# Patient Record
Sex: Female | Born: 1935 | Race: White | Hispanic: No | Marital: Married | Smoking: Current some day smoker
Health system: Southern US, Community
[De-identification: ages and names within clinical notes are randomized; demographics above are authoritative.]

## PROBLEM LIST (undated history)

## (undated) DIAGNOSIS — L988 Other specified disorders of the skin and subcutaneous tissue: Secondary | ICD-10-CM

## (undated) DIAGNOSIS — F411 Generalized anxiety disorder: Secondary | ICD-10-CM

## (undated) DIAGNOSIS — I639 Cerebral infarction, unspecified: Secondary | ICD-10-CM

## (undated) DIAGNOSIS — L89309 Pressure ulcer of unspecified buttock, unspecified stage: Secondary | ICD-10-CM

## (undated) DIAGNOSIS — I10 Essential (primary) hypertension: Secondary | ICD-10-CM

## (undated) DIAGNOSIS — R1312 Dysphagia, oropharyngeal phase: Secondary | ICD-10-CM

## (undated) DIAGNOSIS — F329 Major depressive disorder, single episode, unspecified: Secondary | ICD-10-CM

## (undated) DIAGNOSIS — E785 Hyperlipidemia, unspecified: Secondary | ICD-10-CM

## (undated) DIAGNOSIS — F39 Unspecified mood [affective] disorder: Secondary | ICD-10-CM

## (undated) DIAGNOSIS — R41841 Cognitive communication deficit: Secondary | ICD-10-CM

## (undated) DIAGNOSIS — Z5189 Encounter for other specified aftercare: Secondary | ICD-10-CM

## (undated) DIAGNOSIS — K219 Gastro-esophageal reflux disease without esophagitis: Secondary | ICD-10-CM

## (undated) DIAGNOSIS — I6990 Unspecified sequelae of unspecified cerebrovascular disease: Secondary | ICD-10-CM

## (undated) DIAGNOSIS — G2581 Restless legs syndrome: Secondary | ICD-10-CM

## (undated) DIAGNOSIS — J449 Chronic obstructive pulmonary disease, unspecified: Secondary | ICD-10-CM

## (undated) DIAGNOSIS — K5900 Constipation, unspecified: Secondary | ICD-10-CM

## (undated) DIAGNOSIS — G8929 Other chronic pain: Secondary | ICD-10-CM

## (undated) DIAGNOSIS — G4700 Insomnia, unspecified: Secondary | ICD-10-CM

## (undated) DIAGNOSIS — M6281 Muscle weakness (generalized): Secondary | ICD-10-CM

## (undated) DIAGNOSIS — F3289 Other specified depressive episodes: Secondary | ICD-10-CM

## (undated) DIAGNOSIS — R238 Other skin changes: Secondary | ICD-10-CM

## (undated) DIAGNOSIS — R262 Difficulty in walking, not elsewhere classified: Secondary | ICD-10-CM

## (undated) HISTORY — DX: Difficulty in walking, not elsewhere classified: R26.2

## (undated) HISTORY — DX: Insomnia, unspecified: G47.00

## (undated) HISTORY — PX: BONY PELVIS SURGERY: SHX572

## (undated) HISTORY — DX: Major depressive disorder, single episode, unspecified: F32.9

## (undated) HISTORY — DX: Gastro-esophageal reflux disease without esophagitis: K21.9

## (undated) HISTORY — DX: Generalized anxiety disorder: F41.1

## (undated) HISTORY — DX: Muscle weakness (generalized): M62.81

## (undated) HISTORY — DX: Unspecified sequelae of unspecified cerebrovascular disease: I69.90

## (undated) HISTORY — DX: Restless legs syndrome: G25.81

## (undated) HISTORY — DX: Unspecified mood (affective) disorder: F39

## (undated) HISTORY — DX: Pressure ulcer of unspecified buttock, unspecified stage: L89.309

## (undated) HISTORY — DX: Encounter for other specified aftercare: Z51.89

## (undated) HISTORY — DX: Other specified disorders of the skin and subcutaneous tissue: L98.8

## (undated) HISTORY — DX: Essential (primary) hypertension: I10

## (undated) HISTORY — DX: Other specified depressive episodes: F32.89

## (undated) HISTORY — DX: Other chronic pain: G89.29

## (undated) HISTORY — PX: CHOLECYSTECTOMY: SHX55

## (undated) HISTORY — DX: Chronic obstructive pulmonary disease, unspecified: J44.9

## (undated) HISTORY — DX: Hyperlipidemia, unspecified: E78.5

## (undated) HISTORY — DX: Cognitive communication deficit: R41.841

## (undated) HISTORY — DX: Constipation, unspecified: K59.00

## (undated) HISTORY — DX: Dysphagia, oropharyngeal phase: R13.12

## (undated) HISTORY — DX: Other skin changes: R23.8

---

## 2011-03-14 ENCOUNTER — Inpatient Hospital Stay: Payer: Self-pay | Admitting: Internal Medicine

## 2012-09-17 ENCOUNTER — Emergency Department: Payer: Self-pay | Admitting: Emergency Medicine

## 2012-09-17 LAB — URINALYSIS, COMPLETE
Blood: NEGATIVE
Glucose,UR: NEGATIVE mg/dL (ref 0–75)
Hyaline Cast: 1
Nitrite: NEGATIVE
RBC,UR: NONE SEEN /HPF (ref 0–5)

## 2012-09-17 LAB — COMPREHENSIVE METABOLIC PANEL
Albumin: 4.4 g/dL (ref 3.4–5.0)
Alkaline Phosphatase: 157 U/L — ABNORMAL HIGH (ref 50–136)
BUN: 54 mg/dL — ABNORMAL HIGH (ref 7–18)
Calcium, Total: 9.7 mg/dL (ref 8.5–10.1)
Chloride: 107 mmol/L (ref 98–107)
EGFR (African American): 38 — ABNORMAL LOW
Potassium: 4.5 mmol/L (ref 3.5–5.1)
SGOT(AST): 24 U/L (ref 15–37)
SGPT (ALT): 20 U/L (ref 12–78)

## 2012-09-17 LAB — CBC
HCT: 41.7 % (ref 35.0–47.0)
MCH: 29.5 pg (ref 26.0–34.0)
MCHC: 32.5 g/dL (ref 32.0–36.0)
Platelet: 299 10*3/uL (ref 150–440)
RBC: 4.59 10*6/uL (ref 3.80–5.20)
WBC: 12.7 10*3/uL — ABNORMAL HIGH (ref 3.6–11.0)

## 2012-09-17 LAB — DRUG SCREEN, URINE
Barbiturates, Ur Screen: NEGATIVE (ref ?–200)
Benzodiazepine, Ur Scrn: NEGATIVE (ref ?–200)
Cannabinoid 50 Ng, Ur ~~LOC~~: POSITIVE (ref ?–50)
Cocaine Metabolite,Ur ~~LOC~~: NEGATIVE (ref ?–300)
Phencyclidine (PCP) Ur S: NEGATIVE (ref ?–25)
Tricyclic, Ur Screen: NEGATIVE (ref ?–1000)

## 2012-09-17 LAB — SALICYLATE LEVEL: Salicylates, Serum: 3.5 mg/dL — ABNORMAL HIGH

## 2012-09-17 LAB — ETHANOL: Ethanol: <3 mg/dL

## 2012-09-23 ENCOUNTER — Emergency Department: Payer: Self-pay | Admitting: Emergency Medicine

## 2012-09-23 LAB — COMPREHENSIVE METABOLIC PANEL
Alkaline Phosphatase: 137 U/L — ABNORMAL HIGH (ref 50–136)
Anion Gap: 10 (ref 7–16)
BUN: 21 mg/dL — ABNORMAL HIGH (ref 7–18)
Calcium, Total: 9.1 mg/dL (ref 8.5–10.1)
Chloride: 108 mmol/L — ABNORMAL HIGH (ref 98–107)
Co2: 22 mmol/L (ref 21–32)
Creatinine: 1.01 mg/dL (ref 0.60–1.30)
EGFR (Non-African Amer.): 54 — ABNORMAL LOW
Osmolality: 284 (ref 275–301)

## 2012-09-23 LAB — DRUG SCREEN, URINE
Amphetamines, Ur Screen: NEGATIVE (ref ?–1000)
Benzodiazepine, Ur Scrn: POSITIVE (ref ?–200)
MDMA (Ecstasy)Ur Screen: NEGATIVE (ref ?–500)
Opiate, Ur Screen: NEGATIVE (ref ?–300)

## 2012-09-23 LAB — ACETAMINOPHEN LEVEL: Acetaminophen: <2 ug/mL

## 2012-09-23 LAB — URINALYSIS, COMPLETE
Bilirubin,UR: NEGATIVE
Glucose,UR: NEGATIVE mg/dL (ref 0–75)
Hyaline Cast: 7
Nitrite: NEGATIVE
Ph: 5 (ref 4.5–8.0)
Specific Gravity: 1.02 (ref 1.003–1.030)
Squamous Epithelial: 5

## 2012-09-23 LAB — ETHANOL
Ethanol %: <0.003 % (ref 0.000–0.080)
Ethanol: <3 mg/dL

## 2012-09-23 LAB — SALICYLATE LEVEL: Salicylates, Serum: <1.7 mg/dL

## 2012-09-23 LAB — CBC
HCT: 38.7 % (ref 35.0–47.0)
HGB: 12.9 g/dL (ref 12.0–16.0)
MCH: 30.1 pg (ref 26.0–34.0)
MCHC: 33.4 g/dL (ref 32.0–36.0)
Platelet: 252 10*3/uL (ref 150–440)
RBC: 4.29 10*6/uL (ref 3.80–5.20)
WBC: 14.9 10*3/uL — ABNORMAL HIGH (ref 3.6–11.0)

## 2012-09-23 LAB — TSH: Thyroid Stimulating Horm: 1.62 u[IU]/mL

## 2012-09-25 LAB — CLOSTRIDIUM DIFFICILE BY PCR

## 2012-10-19 ENCOUNTER — Emergency Department: Payer: Self-pay | Admitting: Emergency Medicine

## 2012-10-19 LAB — COMPREHENSIVE METABOLIC PANEL
Albumin: 3.5 g/dL (ref 3.4–5.0)
Anion Gap: 6 — ABNORMAL LOW (ref 7–16)
BUN: 23 mg/dL — ABNORMAL HIGH (ref 7–18)
Bilirubin,Total: 0.3 mg/dL (ref 0.2–1.0)
Calcium, Total: 9 mg/dL (ref 8.5–10.1)
Chloride: 107 mmol/L (ref 98–107)
Co2: 25 mmol/L (ref 21–32)
Creatinine: 0.99 mg/dL (ref 0.60–1.30)
EGFR (African American): >60
EGFR (Non-African Amer.): 56 — ABNORMAL LOW
Potassium: 4.9 mmol/L (ref 3.5–5.1)
SGPT (ALT): 21 U/L (ref 12–78)
Sodium: 138 mmol/L (ref 136–145)
Total Protein: 6.9 g/dL (ref 6.4–8.2)

## 2012-10-19 LAB — URINALYSIS, COMPLETE
Bilirubin,UR: NEGATIVE
Nitrite: NEGATIVE
Ph: 5 (ref 4.5–8.0)
Protein: NEGATIVE
RBC,UR: <1 /HPF (ref 0–5)
Specific Gravity: 1.019 (ref 1.003–1.030)

## 2012-10-19 LAB — DRUG SCREEN, URINE
Amphetamines, Ur Screen: NEGATIVE (ref ?–1000)
Barbiturates, Ur Screen: POSITIVE (ref ?–200)
Benzodiazepine, Ur Scrn: POSITIVE (ref ?–200)
Cannabinoid 50 Ng, Ur ~~LOC~~: POSITIVE (ref ?–50)
Cocaine Metabolite,Ur ~~LOC~~: NEGATIVE (ref ?–300)
Methadone, Ur Screen: NEGATIVE (ref ?–300)
Phencyclidine (PCP) Ur S: NEGATIVE (ref ?–25)
Tricyclic, Ur Screen: NEGATIVE (ref ?–1000)

## 2012-10-19 LAB — CBC
HCT: 33.8 % — ABNORMAL LOW (ref 35.0–47.0)
HGB: 11.1 g/dL — ABNORMAL LOW (ref 12.0–16.0)
MCV: 90 fL (ref 80–100)
Platelet: 387 10*3/uL (ref 150–440)
RDW: 14.2 % (ref 11.5–14.5)
WBC: 8.3 10*3/uL (ref 3.6–11.0)

## 2012-10-19 LAB — TROPONIN I: Troponin-I: <0.02 ng/mL

## 2012-10-19 LAB — TSH: Thyroid Stimulating Horm: 2.02 u[IU]/mL

## 2013-06-04 LAB — BASIC METABOLIC PANEL
BUN: 44 mg/dL — ABNORMAL HIGH (ref 7–18)
Calcium, Total: 9.6 mg/dL (ref 8.5–10.1)
Co2: 27 mmol/L (ref 21–32)
Creatinine: 1.49 mg/dL — ABNORMAL HIGH (ref 0.60–1.30)
Glucose: 129 mg/dL — ABNORMAL HIGH (ref 65–99)
Osmolality: 281 (ref 275–301)
Potassium: 4.5 mmol/L (ref 3.5–5.1)
Sodium: 134 mmol/L — ABNORMAL LOW (ref 136–145)

## 2013-06-04 LAB — DRUG SCREEN, URINE
Benzodiazepine, Ur Scrn: NEGATIVE (ref ?–200)
Cannabinoid 50 Ng, Ur ~~LOC~~: POSITIVE (ref ?–50)
Cocaine Metabolite,Ur ~~LOC~~: NEGATIVE (ref ?–300)
MDMA (Ecstasy)Ur Screen: POSITIVE (ref ?–500)
Methadone, Ur Screen: NEGATIVE (ref ?–300)
Phencyclidine (PCP) Ur S: NEGATIVE (ref ?–25)

## 2013-06-04 LAB — URINALYSIS, COMPLETE
Bilirubin,UR: NEGATIVE
Blood: NEGATIVE
Nitrite: NEGATIVE
Ph: 5 (ref 4.5–8.0)
Protein: NEGATIVE
RBC,UR: 2 /HPF (ref 0–5)
Specific Gravity: 1.025 (ref 1.003–1.030)
Squamous Epithelial: 1
WBC UR: 6 /HPF (ref 0–5)

## 2013-06-04 LAB — CBC WITH DIFFERENTIAL/PLATELET
Basophil #: 0 10*3/uL (ref 0.0–0.1)
HGB: 14.3 g/dL (ref 12.0–16.0)
MCH: 31.1 pg (ref 26.0–34.0)
MCV: 92 fL (ref 80–100)
Monocyte %: 9.2 %
Platelet: 226 10*3/uL (ref 150–440)
WBC: 9.8 10*3/uL (ref 3.6–11.0)

## 2013-06-04 LAB — TROPONIN I: Troponin-I: <0.02 ng/mL

## 2013-06-05 ENCOUNTER — Inpatient Hospital Stay: Payer: Self-pay | Admitting: Family Medicine

## 2013-06-05 LAB — BASIC METABOLIC PANEL
Anion Gap: 9 (ref 7–16)
Calcium, Total: 8.9 mg/dL (ref 8.5–10.1)
Chloride: 105 mmol/L (ref 98–107)
Creatinine: 1 mg/dL (ref 0.60–1.30)
EGFR (African American): >60
EGFR (Non-African Amer.): 55 — ABNORMAL LOW
Osmolality: 281 (ref 275–301)
Potassium: 3.6 mmol/L (ref 3.5–5.1)
Sodium: 138 mmol/L (ref 136–145)

## 2013-06-05 LAB — CBC WITH DIFFERENTIAL/PLATELET
Basophil #: 0 10*3/uL (ref 0.0–0.1)
Eosinophil %: 2.7 %
HCT: 34.6 % — ABNORMAL LOW (ref 35.0–47.0)
HGB: 11.5 g/dL — ABNORMAL LOW (ref 12.0–16.0)
Lymphocyte %: 29.8 %
MCHC: 33.1 g/dL (ref 32.0–36.0)
Monocyte #: 0.8 x10 3/mm (ref 0.2–0.9)
Neutrophil #: 3.3 10*3/uL (ref 1.4–6.5)
Neutrophil %: 53.9 %
Platelet: 164 10*3/uL (ref 150–440)
RDW: 12.5 % (ref 11.5–14.5)

## 2013-06-05 LAB — RAPID INFLUENZA A&B ANTIGENS

## 2013-06-05 LAB — LIPID PANEL
HDL Cholesterol: 36 mg/dL — ABNORMAL LOW (ref 40–60)
Ldl Cholesterol, Calc: 60 mg/dL (ref 0–100)
VLDL Cholesterol, Calc: 50 mg/dL — ABNORMAL HIGH (ref 5–40)

## 2013-06-05 LAB — TSH: Thyroid Stimulating Horm: 2.01 u[IU]/mL

## 2013-07-30 ENCOUNTER — Inpatient Hospital Stay: Payer: Self-pay | Admitting: Internal Medicine

## 2013-07-30 LAB — CBC
HCT: 36.6 % (ref 35.0–47.0)
HGB: 12.2 g/dL (ref 12.0–16.0)
MCH: 30.2 pg (ref 26.0–34.0)
MCHC: 33.3 g/dL (ref 32.0–36.0)
MCV: 91 fL (ref 80–100)
PLATELETS: 242 10*3/uL (ref 150–440)
RBC: 4.03 10*6/uL (ref 3.80–5.20)
RDW: 13.6 % (ref 11.5–14.5)
WBC: 9 10*3/uL (ref 3.6–11.0)

## 2013-07-30 LAB — URINALYSIS, COMPLETE
GLUCOSE, UR: NEGATIVE mg/dL (ref 0–75)
Nitrite: POSITIVE
PH: 5 (ref 4.5–8.0)
PROTEIN: NEGATIVE
RBC, UR: NONE SEEN /HPF (ref 0–5)
SPECIFIC GRAVITY: 1.015 (ref 1.003–1.030)

## 2013-07-30 LAB — COMPREHENSIVE METABOLIC PANEL
ALBUMIN: 3.2 g/dL — AB (ref 3.4–5.0)
Alkaline Phosphatase: 111 U/L
Anion Gap: 5 — ABNORMAL LOW (ref 7–16)
BUN: 21 mg/dL — ABNORMAL HIGH (ref 7–18)
Bilirubin,Total: 0.3 mg/dL (ref 0.2–1.0)
CO2: 28 mmol/L (ref 21–32)
Calcium, Total: 9.2 mg/dL (ref 8.5–10.1)
Chloride: 95 mmol/L — ABNORMAL LOW (ref 98–107)
Creatinine: 0.79 mg/dL (ref 0.60–1.30)
EGFR (African American): >60
Glucose: 110 mg/dL — ABNORMAL HIGH (ref 65–99)
Osmolality: 261 (ref 275–301)
Potassium: 4.1 mmol/L (ref 3.5–5.1)
SGOT(AST): 22 U/L (ref 15–37)
SGPT (ALT): 11 U/L — ABNORMAL LOW (ref 12–78)
Sodium: 128 mmol/L — ABNORMAL LOW (ref 136–145)
Total Protein: 6.5 g/dL (ref 6.4–8.2)

## 2013-07-30 LAB — TROPONIN I

## 2013-07-31 LAB — CBC WITH DIFFERENTIAL/PLATELET
BASOS ABS: 0 10*3/uL (ref 0.0–0.1)
Basophil %: 0.2 %
EOS ABS: 0.1 10*3/uL (ref 0.0–0.7)
EOS PCT: 1 %
HCT: 30.8 % — AB (ref 35.0–47.0)
HGB: 10.6 g/dL — AB (ref 12.0–16.0)
LYMPHS ABS: 1.2 10*3/uL (ref 1.0–3.6)
LYMPHS PCT: 15.5 %
MCH: 31.5 pg (ref 26.0–34.0)
MCHC: 34.3 g/dL (ref 32.0–36.0)
MCV: 92 fL (ref 80–100)
MONO ABS: 1 x10 3/mm — AB (ref 0.2–0.9)
Monocyte %: 12.7 %
Neutrophil #: 5.4 10*3/uL (ref 1.4–6.5)
Neutrophil %: 70.6 %
PLATELETS: 192 10*3/uL (ref 150–440)
RBC: 3.36 10*6/uL — AB (ref 3.80–5.20)
RDW: 13.8 % (ref 11.5–14.5)
WBC: 7.7 10*3/uL (ref 3.6–11.0)

## 2013-07-31 LAB — BASIC METABOLIC PANEL
Anion Gap: 6 — ABNORMAL LOW (ref 7–16)
BUN: 16 mg/dL (ref 7–18)
CHLORIDE: 97 mmol/L — AB (ref 98–107)
CO2: 26 mmol/L (ref 21–32)
Calcium, Total: 8.5 mg/dL (ref 8.5–10.1)
Creatinine: 0.92 mg/dL (ref 0.60–1.30)
EGFR (African American): >60
EGFR (Non-African Amer.): >60
GLUCOSE: 90 mg/dL (ref 65–99)
OSMOLALITY: 260 (ref 275–301)
Potassium: 3.8 mmol/L (ref 3.5–5.1)
Sodium: 129 mmol/L — ABNORMAL LOW (ref 136–145)

## 2013-07-31 LAB — LIPID PANEL
CHOLESTEROL: 116 mg/dL (ref 0–200)
HDL Cholesterol: 36 mg/dL — ABNORMAL LOW (ref 40–60)
LDL CHOLESTEROL, CALC: 58 mg/dL (ref 0–100)
Triglycerides: 112 mg/dL (ref 0–200)
VLDL CHOLESTEROL, CALC: 22 mg/dL (ref 5–40)

## 2013-08-04 LAB — BASIC METABOLIC PANEL
Anion Gap: 5 — ABNORMAL LOW (ref 7–16)
BUN: 13 mg/dL (ref 7–18)
CHLORIDE: 100 mmol/L (ref 98–107)
CO2: 28 mmol/L (ref 21–32)
Calcium, Total: 8.6 mg/dL (ref 8.5–10.1)
Creatinine: 0.81 mg/dL (ref 0.60–1.30)
Glucose: 81 mg/dL (ref 65–99)
Osmolality: 266 (ref 275–301)
Potassium: 3.5 mmol/L (ref 3.5–5.1)
Sodium: 133 mmol/L — ABNORMAL LOW (ref 136–145)

## 2013-08-04 LAB — CBC WITH DIFFERENTIAL/PLATELET
BASOS ABS: 0.1 10*3/uL (ref 0.0–0.1)
BASOS PCT: 0.8 %
Eosinophil #: 0.2 10*3/uL (ref 0.0–0.7)
Eosinophil %: 2.8 %
HCT: 30.2 % — AB (ref 35.0–47.0)
HGB: 10.3 g/dL — AB (ref 12.0–16.0)
LYMPHS PCT: 20.5 %
Lymphocyte #: 1.7 10*3/uL (ref 1.0–3.6)
MCH: 31.3 pg (ref 26.0–34.0)
MCHC: 34 g/dL (ref 32.0–36.0)
MCV: 92 fL (ref 80–100)
MONO ABS: 1.3 x10 3/mm — AB (ref 0.2–0.9)
MONOS PCT: 15.9 %
Neutrophil #: 4.9 10*3/uL (ref 1.4–6.5)
Neutrophil %: 60 %
PLATELETS: 187 10*3/uL (ref 150–440)
RBC: 3.28 10*6/uL — ABNORMAL LOW (ref 3.80–5.20)
RDW: 13.9 % (ref 11.5–14.5)
WBC: 8.2 10*3/uL (ref 3.6–11.0)

## 2013-08-04 LAB — CULTURE, BLOOD (SINGLE)

## 2013-08-10 ENCOUNTER — Other Ambulatory Visit: Payer: Self-pay | Admitting: Family Medicine

## 2013-08-10 LAB — CBC WITH DIFFERENTIAL/PLATELET
Basophil #: 0.1 10*3/uL (ref 0.0–0.1)
Basophil %: 0.6 %
EOS ABS: 0.7 10*3/uL (ref 0.0–0.7)
Eosinophil %: 7.4 %
HCT: 32 % — AB (ref 35.0–47.0)
HGB: 10.7 g/dL — ABNORMAL LOW (ref 12.0–16.0)
Lymphocyte #: 2.4 10*3/uL (ref 1.0–3.6)
Lymphocyte %: 24.5 %
MCH: 31.1 pg (ref 26.0–34.0)
MCHC: 33.6 g/dL (ref 32.0–36.0)
MCV: 93 fL (ref 80–100)
Monocyte #: 1.4 x10 3/mm — ABNORMAL HIGH (ref 0.2–0.9)
Monocyte %: 14 %
Neutrophil #: 5.3 10*3/uL (ref 1.4–6.5)
Neutrophil %: 53.5 %
PLATELETS: 306 10*3/uL (ref 150–440)
RBC: 3.46 10*6/uL — AB (ref 3.80–5.20)
RDW: 14.1 % (ref 11.5–14.5)
WBC: 9.9 10*3/uL (ref 3.6–11.0)

## 2013-08-10 LAB — COMPREHENSIVE METABOLIC PANEL
ALBUMIN: 2.7 g/dL — AB (ref 3.4–5.0)
ANION GAP: 10 (ref 7–16)
AST: 20 U/L (ref 15–37)
Alkaline Phosphatase: 132 U/L — ABNORMAL HIGH
BUN: 11 mg/dL (ref 7–18)
Bilirubin,Total: 0.2 mg/dL (ref 0.2–1.0)
CO2: 26 mmol/L (ref 21–32)
CREATININE: 0.66 mg/dL (ref 0.60–1.30)
Calcium, Total: 8.4 mg/dL — ABNORMAL LOW (ref 8.5–10.1)
Chloride: 97 mmol/L — ABNORMAL LOW (ref 98–107)
EGFR (African American): >60
EGFR (Non-African Amer.): >60
Glucose: 92 mg/dL (ref 65–99)
Osmolality: 265 (ref 275–301)
Potassium: 4.3 mmol/L (ref 3.5–5.1)
SGPT (ALT): 10 U/L — ABNORMAL LOW (ref 12–78)
Sodium: 133 mmol/L — ABNORMAL LOW (ref 136–145)
TOTAL PROTEIN: 5.6 g/dL — AB (ref 6.4–8.2)

## 2013-08-10 LAB — URINALYSIS, COMPLETE
Bacteria: NEGATIVE
Bilirubin,UR: NEGATIVE
Blood: NEGATIVE
Glucose,UR: NEGATIVE mg/dL (ref 0–75)
Ketone: NEGATIVE
LEUKOCYTE ESTERASE: NEGATIVE
Nitrite: NEGATIVE
Ph: 7 (ref 4.5–8.0)
Protein: NEGATIVE
RBC, UR: NONE SEEN /HPF (ref 0–5)
SPECIFIC GRAVITY: 1.008 (ref 1.003–1.030)
Squamous Epithelial: NONE SEEN
WBC UR: NONE SEEN /HPF (ref 0–5)

## 2013-08-11 LAB — URINE CULTURE

## 2013-08-20 ENCOUNTER — Encounter: Payer: Self-pay | Admitting: Podiatry

## 2013-08-30 ENCOUNTER — Ambulatory Visit (INDEPENDENT_AMBULATORY_CARE_PROVIDER_SITE_OTHER): Payer: Medicare Other | Admitting: Podiatry

## 2013-08-30 ENCOUNTER — Encounter: Payer: Self-pay | Admitting: Podiatry

## 2013-08-30 VITALS — BP 117/70 | HR 93 | Resp 16 | Ht 68.0 in | Wt 140.0 lb

## 2013-08-30 DIAGNOSIS — B351 Tinea unguium: Secondary | ICD-10-CM

## 2013-08-30 DIAGNOSIS — M722 Plantar fascial fibromatosis: Secondary | ICD-10-CM

## 2013-08-30 DIAGNOSIS — M79609 Pain in unspecified limb: Secondary | ICD-10-CM

## 2013-08-30 NOTE — Progress Notes (Signed)
   Subjective:    Patient ID: [name] Stuart, female    DOB: 02/26/1935, 79 y.o.   MRN: [id number]  HPI Comments: N sore L sore spot on left heel/ trim toenails D 2 - 3 weeks O ? C same A shoes T 0 / doctor at [facility] trims toenails  Foot Pain Associated symptoms include coughing, fatigue and weakness.      Review of Systems  Constitutional: Positive for fatigue.  HENT: Negative.   Eyes: Negative.   Respiratory: Positive for cough.   Gastrointestinal: Negative.   Endocrine: Negative.   Genitourinary: Negative.   Musculoskeletal:       Difficulty walking  Skin: Positive for wound.  Allergic/Immunologic: Negative.   Neurological: Positive for weakness.  Hematological: Negative.   Psychiatric/Behavioral: Negative.        Objective:   Physical Exam        Assessment & Plan:

## 2013-09-01 NOTE — Progress Notes (Signed)
Subjective:     Patient ID: [name] Stuart, female   DOB: 1935/04/22, 79 y.o.   MRN: [id number]  Foot Pain   patient presents with nail disease 1-5 of both feet that are thick and painful when pressed with patient having inability to cut herself   Review of Systems  All other systems reviewed and are negative.       Objective:   Physical Exam  Nursing note and vitals reviewed. Constitutional: She is oriented to person, place, and time.  Cardiovascular: Intact distal pulses.   Musculoskeletal: Normal range of motion.  Neurological: She is oriented to person, place, and time.  Skin: Skin is warm and dry.   neurovascular status intact with no health history changes noted and range of motion adequate with mild equinus condition of both feet. Patient has normal Fill time with warmth to the digits with dry skin noted. Nailbeds are thick 1-5 both feet and impossible for her to cut     Assessment:     Mycotic nail infection with pain 1-5 both feet    Plan:     H&P performed and debridement of nailbeds 1-5 both feet with no iatrogenic bleeding noted

## 2013-09-11 ENCOUNTER — Other Ambulatory Visit: Payer: Self-pay | Admitting: Family Medicine

## 2013-09-11 LAB — URINALYSIS, COMPLETE
Bacteria: NONE SEEN
Bilirubin,UR: NEGATIVE
Blood: NEGATIVE
Glucose,UR: NEGATIVE mg/dL (ref 0–75)
Ketone: NEGATIVE
Leukocyte Esterase: NEGATIVE
Nitrite: NEGATIVE
PH: 6 (ref 4.5–8.0)
Protein: NEGATIVE
RBC,UR: 1 /HPF (ref 0–5)
SQUAMOUS EPITHELIAL: NONE SEEN
Specific Gravity: 1.012 (ref 1.003–1.030)
WBC UR: 1 /HPF (ref 0–5)

## 2013-09-13 LAB — URINE CULTURE

## 2013-11-22 ENCOUNTER — Ambulatory Visit: Payer: Medicare Other | Admitting: Podiatry

## 2014-07-26 ENCOUNTER — Ambulatory Visit: Payer: Self-pay | Admitting: Family Medicine

## 2014-07-26 LAB — VALPROIC ACID LEVEL: VALPROIC ACID: 84 ug/mL

## 2014-07-26 LAB — CBC WITH DIFFERENTIAL/PLATELET
BASOS ABS: 0 10*3/uL (ref 0.0–0.1)
BASOS PCT: 0.5 %
EOS ABS: 0.1 10*3/uL (ref 0.0–0.7)
Eosinophil %: 0.9 %
HCT: 39.9 % (ref 35.0–47.0)
HGB: 13 g/dL (ref 12.0–16.0)
LYMPHS ABS: 2.1 10*3/uL (ref 1.0–3.6)
Lymphocyte %: 24.9 %
MCH: 30.5 pg (ref 26.0–34.0)
MCHC: 32.6 g/dL (ref 32.0–36.0)
MCV: 94 fL (ref 80–100)
Monocyte #: 1.2 x10 3/mm — ABNORMAL HIGH (ref 0.2–0.9)
Monocyte %: 14.3 %
NEUTROS ABS: 5.1 10*3/uL (ref 1.4–6.5)
Neutrophil %: 59.4 %
Platelet: 280 10*3/uL (ref 150–440)
RBC: 4.26 10*6/uL (ref 3.80–5.20)
RDW: 13.6 % (ref 11.5–14.5)
WBC: 8.6 10*3/uL (ref 3.6–11.0)

## 2014-07-26 LAB — COMPREHENSIVE METABOLIC PANEL
ALBUMIN: 2.4 g/dL — AB (ref 3.4–5.0)
ALT: 9 U/L — AB
ANION GAP: 8 (ref 7–16)
AST: 21 U/L (ref 15–37)
Alkaline Phosphatase: 132 U/L — ABNORMAL HIGH
BILIRUBIN TOTAL: 0.2 mg/dL (ref 0.2–1.0)
BUN: 14 mg/dL (ref 7–18)
Calcium, Total: 8.7 mg/dL (ref 8.5–10.1)
Chloride: 101 mmol/L (ref 98–107)
Co2: 28 mmol/L (ref 21–32)
Creatinine: 0.85 mg/dL (ref 0.60–1.30)
EGFR (African American): >60
EGFR (Non-African Amer.): >60
Glucose: 59 mg/dL — ABNORMAL LOW (ref 65–99)
OSMOLALITY: 272 (ref 275–301)
POTASSIUM: 4.7 mmol/L (ref 3.5–5.1)
Sodium: 137 mmol/L (ref 136–145)
Total Protein: 6.4 g/dL (ref 6.4–8.2)

## 2014-07-26 LAB — LIPID PANEL
Cholesterol: 166 mg/dL (ref 0–200)
HDL Cholesterol: 28 mg/dL — ABNORMAL LOW (ref 40–60)
LDL CHOLESTEROL, CALC: 82 mg/dL (ref 0–100)
Triglycerides: 278 mg/dL — ABNORMAL HIGH (ref 0–200)
VLDL CHOLESTEROL, CALC: 56 mg/dL — AB (ref 5–40)

## 2014-10-24 NOTE — Discharge Summary (Signed)
PATIENT NAME:  [name] Stuart, [name] Stuart MR#:  [id number] DATE OF BIRTH:  1935/06/26  DATE OF ADMISSION:  06/05/2013 DATE OF DISCHARGE:  06/09/2013  This is a very nice 80 year old female with history of dyslipidemia, hypertension, coronary artery disease Comes with generalized weakness, dehydration, acute renal failure and hypotension. This is likely due to intravascular volume depletion on top of possible narcotic and polysubstance abuse. The patient has been very weak, very lethargic. She states at home she does not walk, she does not get around. She is getting more debilitated, for which she is admitted.   As far as problems, the patient has generalized weakness which is due to general deconditioning, dehydration, malnutrition secondary to pure protein intake, renal failure and hypotension.   The patient had blood cultures which show gram-positive cocci in one bottle, which was Streptococcus viridans and this was due to contamination of the sample. Following cultures, urine culture was negative. Chest x-ray did not show any significant signs of pneumonia. The patient has been afebrile. No signs of systemic infection.   As far as her hypotension, please refer the patient had IV fluids and continued to improve. She did developed some edema at after her IV fluids were done, for which Lasix was given on a small dose.   She had acute failure, on admission for what her ACE inhibitor was held and at this moment is not going to be continued since her blood pressure was borderline low. Recommend to re-evaluate this, and if her blood pressure continues to go up to restart that medication in the next week or so. Please follow up on this issue at the nursing home.   She has history of cerebrovascular accident for which she has Plavix. She has restless leg syndrome for which she takes Requip, and overall she is starting to get a little bit better strength. She had a full evaluation by speech therapy for what she had  modification on her diet, mechanical soft diet, and that was triggered due to the patient continues to have cough when she eats.   The patient has history of polysubstance abuse. She has positive marijuana and MDMA on her urine drug screen. She also has nicotine addiction for which she has been counseled. Continue to use inhalers and smoking cessation has been recommended.  The patient is a FULL CODE and she is going to be admitted to skilled nursing facility for continuation of her care.   MEDICATIONS AT DISCHARGE: Pravastatin 40 mg a day, omeprazole 20 mg in the morning, hydrochlorothiazide 25 mg daily, Plavix 75 mg daily, Ropinirole 0.5 mg daily, Spiriva 18 mcg daily, tramadol 50 mg daily, divalproex 250 mg 3 tablets once a day in the evening, trazodone 100 mg in the evening, Lexapro 10 mg once a day, olanzapine 2.5 mg twice daily.   FOLLOWUP: With attending at the nursing home. Please address blood pressure if continues to raise. It is okay to start her back on lisinopril if her kidney function has improved.   Continue physical therapy and smoking cessation counseling.   I spent about 45 minutes with this discharge.   ____________________________ Felipa Furnaceoberto Sanchez Gutierrez, MD rsg:aw D: 06/09/2013 10:54:59 ET T: 06/09/2013 11:31:50 ET JOB#: [id number]  cc: Felipa Furnaceoberto Sanchez Gutierrez, MD, <Dictator> Demetrus Pavao Juanda ChanceSANCHEZ GUTIERRE MD ELECTRONICALLY SIGNED 06/13/2013 18:11

## 2014-10-24 NOTE — H&P (Signed)
PATIENT NAME:  [name] CageCRAWFORD, Arwilda MR#:  [id number] DATE OF BIRTH:  1934/09/11  DATE OF ADMISSION:  06/04/2013  ADDENDUM  Note:  Nicotine cessation counseling done, 4 minutes spent. The patient will be offered a nicotine patch, which she does not want. I recommended her to stop smoking.   ____________________________ Lacie ScottsShreyang H. Allena KatzPatel, MD shp:ce D: 06/04/2013 18:31:46 ET T: 06/04/2013 19:37:27 ET JOB#: [id number]  cc: Chrishawn Boley H. Allena KatzPatel, MD, <Dictator> Charise CarwinSHREYANG H Rogers Ditter MD ELECTRONICALLY SIGNED 06/14/2013 12:48

## 2014-10-24 NOTE — Consult Note (Signed)
Chief Complaint:  Chief Complaint Anger and Irritability   Presenting Symptoms:  Presenting Symptoms Anger/irritability  Anxiety/Panic  Appetite Disturbance  Sleep Disturbance  Mood Disturbance   History of Present Illness:  History of Present Illness Pt is a 80 years old married WF brought to the ED by her family due to worsening behavioral problems. she has been becoming more agitated, throwing things at home and not taking care of herslef. her husband was concerned about her behavior and brought her for evaluation and treatment. During my interview, she was alone and stated that she must be doing something wrong that brought her here. she stated that she has a big black cat leading to all the scrtach marks on her legs. she stated that her daughter comes over to help her. Her UDS is Positive for MJ and according to her husband, she smokes MJ on a consistent basis and also uses Xanax 0.25 TID prescribed by her PCP. she had the stroke 2 years ago leading to difficulty with ambulation. Pt was trying to minimize her symptoms . She denied SI/HI or plans.   Target Symptoms:  Depressive Depressed Mood   Manic Impaired Judgment   Psychosis Disorganization   Behavior Impulsivity   Arousal/Cognitive Decreased Concentration  Behavior Disturbances   Substance Abuse- Cannabis: The patient currently uses cannabis on a regular basis.Marland Kitchen  PAST MEDICAL & SURGICAL HX:  Significant Events:   CVA/Stroke:   CURRENT OUTPATIENT MEDICATIONS:  Home Medications: Medication Instructions Status  Norvasc 10 mg oral tablet 1 tab(s) orally once a day Active  Colace 100 mg oral capsule 1 cap(s) orally 2 times a day Active  MiraLax 17 gram(s) orally once a day, As Needed Active  Plavix 75 mg oral tablet 1 tab(s) orally once a day Active  lisinopril 20 mg oral tablet 1 tab(s) orally 2 times a day Active  Xanax 0.25 mg oral tablet 1 tab(s) orally 3 times a day, As Needed Active  Zantac 150 oral tablet 1 tab(s)  orally 2 times a day Active  Requip 0.25 mg oral tablet 1 tab(s) orally once a day (at bedtime) Active  Lopressor 25 milligram(s) orally 2 times a day Active  Lipitor 40 mg oral tablet 1 tab(s) orally once a day (at bedtime) Active  Tylenol 325 mg oral tablet 2 tab(s) orally every 6 hours, As Needed Active  Benadryl Dye Free Allergy 25 mg oral tablet 1 tab(s) orally once a day (at bedtime), As Needed Active   Social History: Lives with husband. Daughter is very supportive.  Mental Status Exam:  Mental Status Exam Disheveled appearing female.   Speech Monotone   Mood Anxious   Affect Constricted   Thought Processes Disorganized   Orientation Self  Place   Attention Awake  Oriented   Concentration Fair   Memory Intact   Fund of Knowledge Fair   Language Fair   Judgement Fair   Insight Fair   Reliabiity Fair   Suicide Risk Assessment: Suicide Risk Level No risk inidicated.  Review of Systems:  Review of Systems:  Medications/Allergies Reviewed Medications/Allergies reviewed   NURSING FLOWSHEETS:  Vital Signs/Nurse Notes-CM: ED Vital Sign Flow Sheet:   18-Mar-14 13:12  Pulse Pulse 88  Respirations Respirations 20  SBP SBP 114  DBP DBP 60  Pulse Ox % Pulse Ox % 100  Pulse Ox Source Source Room Air  Pain Scale (0-10) Pain Scale (0-10) Scale:7  Telemetry pattern Cardiac Rhythm Normal sinus rhythm  Cardiac Monitor On Yes  LAB:  Laboratory Results: Hepatic:    17-Mar-14 19:04, Comprehensive Metabolic Panel  Bilirubin, Total 0.5  Alkaline Phosphatase 157  SGPT (ALT) 20  SGOT (AST) 24  Total Protein, Serum 8.1  Albumin, Serum 4.4  Lab:    17-Mar-14 19:58, ABG  pH (ABG) 7.36  PCO2 33  PO2 84  FiO2 21  Base Excess -5.9  HCO3 18.6  O2 Saturation 96.7  O2 Device Room Air  Specimen Site (ABG)   RT BRACHIAL  Specimen Type (ABG) ARTERIAL  Patient Temp (ABG) 37.0  Result(s) reported on 17 Sep 2012 at 08:13PM.  General Ref:    17-Mar-14 19:04,  Acetaminophen, Serum  Acetaminophen, Serum < 2  10-30  POTENTIALLY TOXIC:   > 200 mcg/mL   > 50 mcg/mL at 12 hr after   ingestion   > 300 mcg/mL at 4 hr after   ingestion    48-GNO-03 70:48, Salicylates, Serum  Salicylates, Serum 3.5  0.0-2.8  Therapeutic 2.8-20.0 mg/dL  Toxic >30.0 mg/dL  Cardiology:    17-Mar-14 20:39, ED ECG  Ventricular Rate 92  Atrial Rate 92  P-R Interval 142  QRS Duration 112  QT 396  QTc 489  P Axis 76  R Axis 90  T Axis 62  ECG interpretation   Normal sinus rhythm  Right bundle branch block  Abnormal ECG  When compared with ECG of 14-Mar-2011 13:07,  Right bundle branch block has replaced Incomplete right bundle branch block  Criteria for Septal infarct are no longer Present  ----------unconfirmed----------  Confirmed by OVERREAD, NOT ([id number]), editor PEARSON, BARBARA ([id number]) on 09/18/2012 8:26:10 AM  ED ECG   Routine Chem:    17-Mar-14 19:04, Comprehensive Metabolic Panel  Glucose, Serum 107  BUN 54  Creatinine (comp) 1.53  Sodium, Serum 141  Potassium, Serum 4.5  Chloride, Serum 107  CO2, Serum 21  Calcium (Total), Serum 9.7  Osmolality (calc) 296  eGFR (African American) 38  eGFR (Non-African American) 33  eGFR values <89mL/min/1.73 m2 may be an indication of chronic  kidney disease (CKD).  Calculated eGFR is useful in patients with stable renal function.  The eGFR calculation will not be reliable in acutely ill patients  when serum creatinine is changing rapidly. It is not useful in   patients on dialysis. The eGFR calculation may not be applicable  to patients at the low and high extremes of body sizes, pregnant  women, and vegetarians.  Anion Gap 13    17-Mar-14 19:04, Ethanol, Serum  Ethanol, S. < 3  Ethanol % (comp) < 0.003  Result(s) reported on 17 Sep 2012 at 07:59PM.  Urine Drugs:    17-Mar-14 20:29, Urine Drug Screen, Qual  Tricyclic Antidepressant, Ur Qual (comp) NEGATIVE  Result(s) reported on 17 Sep 2012 at 08:48PM.   Amphetamines, Urine Qual. NEGATIVE  MDMA, Urine Qual. NEGATIVE  Cocaine Metabolite, Urine Qual. NEGATIVE  Opiate, Urine qual NEGATIVE  Phencyclidine, Urine Qual. NEGATIVE  Cannabinoid, Urine Qual. POSITIVE  Barbiturates, Urine Qual. NEGATIVE  Benzodiazepine, Urine Qual. NEGATIVE  -----------------  The URINE DRUG SCREEN provides only a preliminary, unconfirmed  analytical test result and should not be used for non-medical   purposes.  Clinical consideration and professional judgment should be   applied to any positive drug screen result due to possible  interfering substances.  A more specific alternate chemical method  must be used in order to obtain a confirmed analytical result.  Gas  chromatography/mass spectrometry (GC/MS) is the preferred  confirmatory method.  Methadone,  Urine Qual. NEGATIVE  Cardiac:    17-Mar-14 19:04, Cardiac Panel  CK, Total 77  CPK-MB, Serum 3.1  Result(s) reported on 17 Sep 2012 at 07:59PM.    17-Mar-14 19:04, Troponin I  Troponin I 0.03  0.00-0.05  0.05 ng/mL or less: NEGATIVE   Repeat testing in 3-6 hrs   if clinically indicated.  >0.05 ng/mL: POTENTIAL   MYOCARDIAL INJURY. Repeat   testing in 3-6 hrs if   clinically indicated.  NOTE: An increase or decrease   of 30% or more on serial   testing suggests a   clinically important change  Routine UA:    17-Mar-14 20:29, Urinalysis  Color (UA) Yellow  Clarity (UA) Hazy  Glucose (UA) Negative  Bilirubin (UA) Negative  Ketones (UA) 1+  Specific Gravity (UA) 1.018  Blood (UA) Negative  pH (UA) 5.0  Protein (UA) Negative  Nitrite (UA) Negative  Leukocyte Esterase (UA) Trace  Result(s) reported on 17 Sep 2012 at 08:52PM.  RBC (UA)   NONE SEEN  WBC (UA) 1 /HPF  Bacteria (UA) TRACE  Epithelial Cells (UA) <1 /HPF  Mucous (UA) PRESENT  Hyaline Cast (UA) 1 /LPF  Result(s) reported on 17 Sep 2012 at 08:52PM.  Routine Hem:    17-Mar-14 19:04, Hemogram, Platelet Count  WBC (CBC) 12.7  RBC  (CBC) 4.59  Hemoglobin (CBC) 13.6  Hematocrit (CBC) 41.7  Platelet Count (CBC) 299  Result(s) reported on 17 Sep 2012 at 07:56PM.  MCV 91  MCH 29.5  MCHC 32.5  RDW 13.9   Assessment & Diagnosis: Axis I: Mood Do NOs Cannabis Abuse.   Axis II: none.   Axis III: CVA.  Treatment Plan: Pt does not meet the criteria for IVC Will adjust meds as follows Xanax 0.98m po qhs add Paxil 149mpoqhs  D/C Benadryl.  Follow up with [facility].  Discussed with family and they agreed with plan.  Electronic Signatures: FaJeronimo NormaMD)  (Signed 18-Mar-14 23:28)  Authored: Chief Complaint, Presenting Symptoms, History of Present Illness, Target Symptoms, Substance Abuse History, PAST MEDICAL & SURGICAL HX, CURRENT OUTPATIENT MEDICATIONS, Social History, Mental Status Exam, Suicide Risk Assessment, Review of Systems, NURSING FLOWSHEETS, LAB, Assessment & Diagnosis, Treatment Plan   Last Updated: 18-Mar-14 23:28 by FaJeronimo NormaMD)

## 2014-10-24 NOTE — H&P (Signed)
PATIENT NAME:  [name] Stuart, [name] Stuart MR#:  [id number] DATE OF BIRTH:  08-Dec-1934  DATE OF ADMISSION:  06/04/2013  PRIMARY CARE PROVIDER: Dr. Robby Sermon according to her.   REFERRING PHYSICIAN:  Caryn Bee A. Paduchowski, MD   CHIEF COMPLAINT: Bilateral lower extremity weakness.   HISTORY OF PRESENT ILLNESS: The patient is a 80 year old white female with history of previous lacunar strokes with history of right-sided weakness, who normally uses a walker to ambulate. She also has a history of cognitive disorder, felt to be due to CVA as well as THC abuse in the past who presents with not feeling well for the past few weeks. The patient reports that she has been having difficulty with walking. She has had difficulty with walking and she is not able to get out of bed. She has been laying in bed unable to do much. The patient, when arrived in the ED, was noted to have intermittent low blood pressure in the 70s. She is on antihypertensives and she has been taking those a regular basis. She denies asymmetrical weakness. She denies any fevers, chills. No chest pains, no palpitations. Does complain of chronic cough with whitish sputum as a result of smoking. Denies any abdominal pain, nausea or vomiting or diarrhea. Denies any urinary frequency, urgency or hesitancy. Denies any neck pain, photophobia or neck stiffness. Denies any visual difficulties.   PAST MEDICAL HISTORY: Significant for:  1.  History of restless leg syndrome.  2.  History of cognitive disorder felt to be due to her CVA as well as THC abuse, has been seen by psychiatry multiple times.  3.  Hypertension.  4.  History of CVA, according to her 2 times.  5.  Hyperlipidemia.   PAST SURGICAL HISTORY: History of lymph node biopsy for possible cat scratch disease, history of cholecystectomy.   ALLERGIES: ASPIRIN, CODEINE AND IBUPROFEN.   CURRENT MEDICATIONS: She is on Plavix 75 p.o. daily, valproic acid 250 three tabs daily, Lexapro 10 daily,  hydrochlorothiazide 25 daily, lisinopril 40 daily, olanzapine 2.5 mg 1 tab p.o. b.i.d., omeprazole 20 one tab p.o. daily, pravastatin 40 daily, ropinirole 0.5 at bedtime, tramadol 50 one tab p.o. every morning, trazodone 100 at bedtime.   SOCIAL HISTORY: Still smoking about 1 to 2 cigarettes a day, history of heavy smoking before but in the process of quitting. No alcohol or drug use. She currently lives with her husband, continues to smoke marijuana.   FAMILY HISTORY: No family history of hypertension or diabetes.   REVIEW OF SYSTEMS: CONSTITUTIONAL: Complains of no fevers. Complains of fatigue, weakness. No pain. No weight loss. No weight gain.  EYES: No blurred or double vision. No pain. No redness. No inflammation. No glaucoma. No cataracts.  ENT: No tinnitus. No ear pain. No hearing loss. No seasonal or year-round allergies. No epistaxis. No snoring. No postnasal drip. No difficulty swallowing.  RESPIRATORY: Has chronic cough. No wheezing. No hemoptysis. No COPD diagnosis.  CARDIOVASCULAR: Denies any chest pain, orthopnea, edema or arrhythmia.  GASTROINTESTINAL: No nausea, vomiting, diarrhea. No abdominal pain. No hematemesis. No melena. No GERD. No IBS. No jaundice.  GENITOURINARY: Denies any dysuria, hematuria, renal calc or frequency.  ENDOCRINE: Denies any polyuria, nocturia or thyroid problems.  HEMATOLOGIC AND LYMPHATIC: Denies anemia, easy bruisability or bleeding.  SKIN: No acne. No rash. No changes in mole, hair or skin.  MUSCULOSKELETAL: Denies any pain in the neck, back or shoulder.  NEUROLOGIC: Has chronic weakness. Denies any dysarthria. No seizures. Has a history of CVA, has some  memory loss.   PSYCHIATRIC: Has anxiety, has cognitive disorder.   PHYSICAL EXAMINATION: VITAL SIGNS: Temperature 97.8, pulse 93, respirations 18, blood pressure 112/66, O2 is 96%.  GENERAL: The patient is a very disheveled female, appears chronically ill.  HEENT: Head atraumatic, normocephalic.  Pupils equally round, reactive to light and accommodation. There is no conjunctival pallor. No scleral icterus. Nasal exam shows no drainage or ulceration. Oropharynx is very dry.  NECK: Supple without any JVD. No carotid bruits.  CARDIOVASCULAR: Regular rate and rhythm. No murmurs, rubs, clicks or gallops. PMI is not displaced.  ABDOMEN: Soft, nontender, nondistended, positive bowel sounds x 4. No hepatosplenomegaly.  EXTREMITIES: No clubbing, cyanosis or edema.  SKIN: No rash.  LYMPHATICS: No lymph nodes palpable.  NEUROLOGIC: Awake, alert, oriented x 3. She has got generalized weakness involving all 4 extremities. Reflexes 2+. Babinski is downgoing.  PSYCHIATRIC: Not anxious or depressed.   LABORATORY AND RADIOLOGICAL EVALUATIONS: So far, glucose 129, BUN 44, creatinine 1.49, sodium 134, potassium 4.5, chloride 100, CO2 27, calcium 9.6. Troponin less than 0.02.  tuds are THC positive, MDMA positive. WBC 9.8, hemoglobin 14.3, platelet count 226, leukocytes 1+. Lactic acid 2.1. Chest x-ray shows no acute abnormality, mild elevation of the left hemidiaphragm.   ASSESSMENT AND PLAN: The patient is a 80 year old white female with history of lacunar infarcts in the past with hypertension who presents with generalized weakness, noted to have low blood pressure, acute renal failure.  1.  Generalized weakness, likely due to hypotension and dehydration. At this time, we will give her IV fluids. She also may have possible mild urinary tract infection. Will treat her with p.o. Cipro. I will also get a CT scan of the head in light of her previous history of stroke. I will also ask physical therapy to come see the patient.  2.  Hypertension. We will hold her antihypertensives for the time being. Follow her blood pressure.  3.  History of cerebrovascular accident. Continue Plavix as taking previously.  4.  History of restless leg syndrome. Continue Requip as taking at home.  5.  Cognitive disorder. We will  continue Depakote, Lexapro and trazodone as taking at home.  6.  Miscellaneous. We will place her on Lovenox for deep vein thrombosis prophylaxis.   TIME SPENT:  55 minutes spent on this H and P.     ____________________________ Lacie ScottsShreyang H. Allena KatzPatel, MD shp:cs D: 06/04/2013 18:30:08 ET T: 06/04/2013 18:50:57 ET JOB#: [id number]  cc: Jackeline Gutknecht H. Allena KatzPatel, MD, <Dictator> Charise CarwinSHREYANG H Nahdia Doucet MD ELECTRONICALLY SIGNED 06/14/2013 12:48

## 2014-10-24 NOTE — Consult Note (Signed)
Brief Consult Note: Diagnosis: Dementia NOS, Cannabis Abuse.   Patient was seen by consultant.   Recommend further assessment or treatment.   Comments: Pt seen in ED. She appeared calm and cooperative and did not exhibit any behavioral problems. [facility] as her family was unable to take care of her home due to her agitation. She was d/c home last week from ED and her meds were adjusted but her husband did not change her meds. They are looking for placement in Psych hospital at this time.  She denied SI/HI or plans.   Plan:  will continue meds.  Awaiting placement.  No change in meds.  Electronic Signatures: Rhunette CroftFaheem, Vanshika Jastrzebski S (MD)  (Signed 25-Mar-14 12:18)  Authored: Brief Consult Note   Last Updated: 25-Mar-14 12:18 by Rhunette CroftFaheem, Emori Mumme S (MD)

## 2014-10-25 NOTE — H&P (Signed)
PATIENT NAME:  [name] Stuart, [name] Stuart MR#:  [id number] DATE OF BIRTH:  Apr 04, 1935  DATE OF ADMISSION:  07/30/2013  PRIMARY CARE PHYSICIAN: Dr. Elpidio AnisPrasad.  CHIEF COMPLAINT: Brought in by her family.   HISTORY OF PRESENT ILLNESS: This is a 80 year old female with history of CVA, hyperlipidemia, hypertension. She is brought in by her family since she has not been feeling well. The patient is unable to give me any pertinent history secondary to altered mental status. In the ER, she was found to have a pneumonia, tachycardic, and hyponatremia. Hospitalist services were contacted for further evaluation. The daughter who was at the bedside left before my evaluation and I was unable to reach her on the phone. History obtained from old chart.   PAST MEDICAL HISTORY: Restless leg syndrome, cognitive disorder, hypertension, CVA, hyperlipidemia.   PAST SURGICAL HISTORY: Lymph node biopsy, cholecystectomy.   ALLERGIES: IN THE COMPUTER LISTED AS ASPIRIN, CODEINE, IBUPROFEN.   CURRENT MEDICATIONS: Include Plavix 75 mg daily, Depakote 250 mg 3 tablets in the evening, Lexapro 10 mg daily, hydrochlorothiazide 25 mg daily, olanzapine 2.5 mg twice a day, omeprazole 20 mg daily, pravastatin 10 mg daily, Requip 0.5 mg in the evening, Spiriva 1 inhalation daily, tramadol 50 mg in the morning, trazodone 100 mg in the evening.   SOCIAL HISTORY: Currently at home. No smoking. No alcohol. No drug use.   FAMILY HISTORY: Unable to obtain from patient secondary to altered mental status.   REVIEW OF SYSTEMS: In this patient was unreliable. She answered yes to every question.  CONSTITUTIONAL: Positive for fever. Positive for chills. Positive for sweats. Positive for weight loss. Positive for weakness.  EYES: Does wear glasses.  EARS, NOSE, MOUTH AND THROAT: Decreased hearing. Positive runny nose. Positive for sore throat. Positive for dysphagia.  CARDIOVASCULAR: Positive for chest pain. Positive for palpitations.  RESPIRATORY:  Positive for shortness of breath. Positive for cough. Positive for sputum, cannot tell what color.  GASTROINTESTINAL: Positive for nausea. Positive for vomiting. Positive for abdominal pain. Positive for diarrhea.  GENITOURINARY: Positive for blood in the urine and burning on urination.  MUSCULOSKELETAL: Positive for joint pains.  INTEGUMENTARY: Positive for rash.  NEUROLOGIC: Positive for fainting.  PSYCHIATRIC: Positive for depression and anxiety.  ENDOCRINE: No thyroid problems.  HEMATOLOGIC AND LYMPHATIC: No anemia.   PHYSICAL EXAMINATION: VITAL SIGNS: Temperature 97.5, pulse 94, respirations 20, blood pressure 165/79, pulse oximetry 94% on room air.  GENERAL: No respiratory distress.  EYES: Conjunctivae and lids normal. Pupils equal, round, and reactive to light. Unable to test extraocular muscles.  EARS, NOSE, MOUTH AND THROAT: Tympanic membranes: No erythema. Nasal mucosa: No erythema. Throat: No erythema, no exudate seen. Lips and gums: No lesions.  NECK: No JVD. No bruits. No lymphadenopathy. No thyromegaly. No thyroid nodules palpated.  RESPIRATORY: Rhonchi bilateral bases.  CARDIOVASCULAR SYSTEM: S1, S2 normal. No gallops, rubs, or murmurs heard. Carotid upstroke 2+ bilaterally. No bruits. Dorsalis pedis pulses 1+ bilaterally. Trace edema of the lower extremities.  ABDOMEN: Soft, nontender. No organomegaly/splenomegaly. Normoactive bowel sounds. No masses felt.  LYMPHATIC: No lymph nodes in the neck.  MUSCULOSKELETAL: Trace edema. No clubbing. No cyanosis.  NEUROLOGIC: The patient is barely able to lift legs up off the bed. Moving upper extremities.  PSYCHIATRIC: The patient is confused. Alert to person.   LABORATORY AND RADIOLOGICAL DATA: Lactic acid 1.3. White blood cell count 9.0, hemoglobin and hematocrit 12.2 and 36.6, platelet count of 242. Glucose 110, BUN 21, creatinine 0.79, sodium 128, potassium 4.1, chloride 95, CO2  of 28, calcium 9.2. Liver function tests: Normal range.  Albumin low at 3.2. Troponin negative.   Chest x-ray: Mild prominent interstitial markings, atypical infection versus bronchitis, small focal opacity right upper lobe.   ASSESSMENT AND PLAN: 1.  Pneumonia with tachycardia: Since the patient was recently in the hospital and recently at rehab, will get aggressive with antibiotics. Levaquin, aztreonam, and vancomycin since THE PATIENT DOES HAVE AN ALLERGY TO PENICILLIN.  2.  Acute encephalopathy: I do not know what the patient's baseline is, but the patient is confused at this point, likely secondary to infection.  3.  Hyponatremia: Will start off by giving gentle IV fluid hydration and stopping the patient's hydrochlorothiazide. If that does not improve the patient's symptoms, may have to stop some of the psychiatric medications such as Lexapro or Zyprexa, but I am hesitant to do so at this point.  4.  Hypertension: Will monitor off hydrochlorothiazide.  5.  History of cerebrovascular accident: On Plavix. Will get physical therapy evaluation.  6.  Hyperlipidemia: Continue statin. Check a lipid profile in the a.m.  7.  As per last time in the hospital, the patient is a full code.   TIME SPENT ON ADMISSION: 50 minutes.    ____________________________ Herschell Dimes. Renae Gloss, MD rjw:jcm D: 07/30/2013 13:35:45 ET T: 07/30/2013 13:56:04 ET JOB#: [id number]  cc: Herschell Dimes. Renae Gloss, MD, <Dictator> Dr. Karren Cobble MD ELECTRONICALLY SIGNED 08/02/2013 15:26

## 2014-10-25 NOTE — Consult Note (Signed)
Brief Consult Note: Diagnosis: delirium.   Patient was seen by consultant.   Consult note dictated.   Comments: Psychiatry: Patient seen and chart reviewed. Patient admitted with altered mental status. Currently unable to give any history. I have tried the phone numbers in the chart and have not been able to reach any family. Patient has unclear past psych history. Hx of substance abuse. Other dx unclear except dementia. Will not change any orders now. Will try to follow.  Electronic Signatures: Audery Amellapacs, Roopa Graver T (MD)  (Signed 28-Jan-15 17:37)  Authored: Brief Consult Note   Last Updated: 28-Jan-15 17:37 by Audery Amellapacs, Abriel Hattery T (MD)

## 2014-10-25 NOTE — Discharge Summary (Signed)
PATIENT NAME:  [name] Stuart, Latroya MR#:  [id number] DATE OF BIRTH:  August 19, 1934  DATE OF ADMISSION:  07/30/2013 DATE OF DISCHARGE:  08/05/2013  ADMITTING PHYSICIAN: Alford Highlandichard Wieting, MD  DISCHARGING PHYSICIAN: Enid Baasadhika Samyak Sackmann, MD  PRIMARY CARE PHYSICIAN: Nonlocal at this time.  CONSULTANTS: Psychiatric by Dr. Toni Amendlapacs.  DISCHARGE DIAGNOSES: 1.  Acute metabolic encephalopathy.  2.  Urinary tract infection.  3.  Chronic cognitive dysfunction.  4.  Prior cerebrovascular accident with left-sided weakness.  5.  Dysphagia secondary to her old cerebrovascular accident. 6.  History of bipolar disorder and psychosis and admission to psychiatric hospitals in the past.  7.  Hypertension.  8.  Hyponatremia.  9.  Hyperlipidemia.  10.  Adult failure to thrive.  11.  Systemic antiinflammatory response syndrome secondary to urinary tract infection.   DISCHARGE HOME MEDICATIONS:  1.  Omeprazole 20 mg p.o. daily.  2.  Plavix 75 mg p.o. daily.  3.  Ropinirole 0.5 mg p.o. in the evening.  4.  Spiriva inhaler capsule daily.  5.  Depakote 750 mg p.o. daily.  6.  Trazodone 100 mg p.o. in the evening.  7.  Lexapro 10 mg p.o. daily.  8.  Olanzapine 2.5 mg p.o. b.i.d.  9.  Pravachol 10 mg p.o. at bedtime.  10.  Tylenol 650 mg q. 4 hours p.r.n. for pain or fever.  11.  Norvasc 2.5 mg p.o. daily.   DISCHARGE DIET: Low-sodium, which is mechanical soft with honey thick liquids and strict aspiration precautions needed.   DISCHARGE ACTIVITY: As tolerated.  FOLLOWUP INSTRUCTIONS: 1.  PCP follow-up in the next 1 to 2 days at rehab.  2.  Physical therapy.  3.  Speech therapy follow-up for dysphagia. The patient might need modified barium swallow study in the coming 2 to 4 weeks.   LABS AND IMAGING STUDIES PRIOR TO DISCHARGE:  WBC 8.2, hemoglobin 10.3, hematocrit 30.2, and platelet count is 187.   Sodium 133, potassium 3.5, chloride 100, bicarb 28, BUN 13, creatinine 0.81, glucose 81, and calcium of 8.6.    LDL is 58, HDL 36, total cholesterol 045116, triglycerides 112. Urinalysis with nitrite positive, 3+ leukocyte esterase, several bacteria and WBCs. Blood cultures are negative. Unfortunately, urine cultures were not sent on admission.   CT of the head without contrast showing no acute intracranial abnormality, chronic small vessel ischemic changes noted, and also increased right sphenoid sinus mucosal thickening noted.   BRIEF HOSPITAL COURSE: [name] Stuart is a 80 year old elderly Caucasian female with past medical history significant for history of stroke with left-sided weakness and significant dysphagia and restless leg syndrome and cognitive dysfunction with mild dementia who presented to the hospital by family secondary to altered mental status. She was noted to have significant urinary tract infection on admission.  1.  Acute metabolic encephalopathy secondary to UTI. Cultures were not sent from the urine, unfortunately. She was on Levaquin and finished off her course while in the hospital. Her blood cultures remained negative. She has not had any fevers and her mental status improved. 2.  Early cognitive decline with dementia. The patient is alert, awake, and oriented, follows commands but does not have short term memory. She was living at home with her husband; however, she has been extensively weak since her infection and physical therapy recommended rehab so she will be going to rehab.  3.  History of stroke with left-sided weakness and significant dysphagia. Seen by speech pathologist who recommended honey thick liquids with mechanical soft diet, which the patient is  on currently. However, she will need extensive speech therapy follow-up even at the rehab and might benefit from having a modified barium swallow study in the next 2 to 4 weeks. She is on Plavix or her stroke and also on statin for the same.  4.  Depression and bipolar with history of psychosis. Seen by Dr. Toni Amend in the hospital.  She is on olanzapine, Lexapro, trazodone, and Depakote at this time for mood stabilization as well.   Her course has been otherwise uneventful in the hospital.   DISCHARGE CONDITION: Stable.   DISCHARGE DISPOSITION: To [facility] skilled nursing facility.  TIME SPENT ON DISCHARGE: 45 minutes. ____________________________ Enid Baas, MD rk:sb D: 08/05/2013 12:22:10 ET T: 08/05/2013 13:22:58 ET JOB#: [id number]  cc: Enid Baas, MD, <Dictator> Enid Baas MD ELECTRONICALLY SIGNED 08/08/2013 14:54

## 2014-10-25 NOTE — Consult Note (Signed)
PATIENT NAME:  [name] Stuart, [name] MR#:  [id number] DATE OF BIRTH:  [birth year]  DATE OF CONSULTATION:  07/31/2013  REFERRING PHYSICIAN:   CONSULTING PHYSICIAN:  Audery AmelJohn T. Clapacs, MD  IDENTIFYING INFORMATION AND REASON FOR CONSULTATION: This is a 80 year old woman who was brought to the hospital with complaints of altered mental status. Consultation was for "history of bipolar, possible need for adjustment in meds."   HISTORY OF PRESENT ILLNESS: History, such as it is, was obtained from the patient and from the chart. I have tried every phone number I could find in the chart and I have been unable to reach any family. The patient was not able to give me much useful history at all. She was brought into the hospital yesterday, apparently with complaints of altered mental status. How far this is off her baseline is not clear to me as it was documented in older notes that she had been unable to walk recently. In the past, however, it had seemed like she had at least been able to talk. The patient today can tell me that she is in the hospital but is otherwise disoriented and is not able to tell me why she is in the hospital or describe any of the recent events or symptoms she has been having. She was discovered to have a pneumonia and that appears to be what she is mainly being treated for. As far as we know, she has been compliant with medications although I only have the chart to go on for that. The patient cannot really tell me. She denies to me that she uses marijuana frequently but admits that she still gets high "sometimes." Again, her history is very poor and vague around this. She denies having any suicidal ideation. There is no indication here that she had been violent or aggressive. Currently, according to the admission note, she is being prescribed Lexapro 10 mg a day, olanzapine 2.5 mg twice a day, trazodone 100 mg at night, Depakote 750 mg at night for psychiatric illness.   PAST PSYCHIATRIC HISTORY: The  patient has been seen a couple of times in our Emergency Room but never admitted to the psychiatry service. On previous evaluations, Dr. Garnetta BuddyFaheem seems to have primarily felt the patient had dementia and a history of substance abuse. It looks like on at least 1 occasion the family were unable to manage her and she was transferred to a geriatric psychiatry unit. It is not clear to me what diagnosis she was given there or where she was started on her current medicines since these are different than what was listed in older notes in the chart. In earlier notes I do not see any history to suggest a more longstanding mental health problem but our data is lacking.   SOCIAL HISTORY: I am not even clear about this. I see some notes that say that she is currently living at home. She was placed in a skilled nursing facility not long ago but perhaps she was taken back home. She says that she lives with her husband. I was unable to find a phone number that worked.   SUBSTANCE ABUSE HISTORY: There is a documented history of marijuana abuse in recent years and possible abuse of prescription drugs.   PAST MEDICAL HISTORY: History of a stroke, history of coronary artery disease, dyslipidemia, restless leg syndrome, COPD.    CURRENT MEDICATIONS: According to the chart Plavix 75 mg per day, Depakote 750 mg at night, Lexapro 10 mg per day, hydrochlorothiazide 25  mg per day, olanzapine 2.5 mg twice a day, omeprazole 20 mg per day, pravastatin 10 mg a day, Requip 0.5 mg at night, Spiriva HandiHaler once a day, tramadol 50 mg in the morning, trazodone 100 mg at night.   ALLERGIES: ASPIRIN, CODEINE, IBUPROFEN.   REVIEW OF SYSTEMS: The patient denied anything and was not able to give any history.   MENTAL STATUS EXAMINATION:  Disheveled, sick-looking woman, looks older than her stated age. The patient had her eyes open but was not moving when I came into the room. After I spoke to her several times, she swiveled her eyes  towards me and made some eye contact but did not move voluntarily at all while I was in the room. Speech was minimal, whispered, hard to understand, only a couple of words at a time. Affect flat. She tends to repeat herself on the same phrase over and over again. Denied suicidal ideation. Denied acute hallucinations. The patient was able to repeat 3 words immediately but could not remember any of them at 1 minute. She knew where she was but could not tell me the year.   LABORATORY, DIAGNOSTIC AND RADIOLOGICAL DATA:  Labs from this hospitalization so far include chemistry panel: Slightly elevated glucose, sodium low at 128, chloride low at 95. CBC unremarkable. Followup chemistry today sodium is still low at 129. Today, she has a hematocrit low at 30.8, probably from being rehydrated. Her urinalysis is 3+ for leukocyte esterase, positive bacteria and white cells. There is no growth yet on any blood cultures. CAT scan of the head shows no acute infarct, no change from previous scans.    ASSESSMENT: This is a 80 year old woman who, to my exam right now, is delirious, also probably very demented. Does not appear to be depressed. I see the word bipolar disorder used at one point in her chart. I do not see any clear documentation of a diagnosis of that although the medications could be consistent with it. If so, it appears that was perhaps diagnosed fairly recently. We do know that she has a history of substance abuse but do not know what has been going on recently. There is no drug screen in the chart. I have not been able to reach any family to get any collateral information.   TREATMENT PLAN: I not going to change or add any medication right now. It looks like she was continued on the medicines quoted above when she came into the hospital. I will follow up with her tomorrow to try and get some family information. At this point, it is hard to imagine her living at home without nursing-level care.   DIAGNOSIS,  PRINCIPAL AND PRIMARY:  AXIS I: Delirium.   SECONDARY DIAGNOSES: AXIS I:  1.  Dementia.  2.  Marijuana abuse.  AXIS II: Deferred.  AXIS III: History of stroke, pneumonia, coronary artery disease, chronic obstructive pulmonary disease, dyslipidemia.  AXIS IV: Moderate from chronic illness and living at home.  AXIS V: Functioning at time of evaluation 25.    ____________________________ Audery Amel, MD jtc:cs D: 07/31/2013 17:47:56 ET T: 07/31/2013 18:16:38 ET JOB#: [id number]  cc: Audery Amel, MD, <Dictator> Audery Amel MD ELECTRONICALLY SIGNED 08/01/2013 12:47

## 2014-10-25 NOTE — Discharge Summary (Signed)
PATIENT NAME:  [name] Stuart, [name] Stuart MR#:  [id number] DATE OF BIRTH:  05-Oct-1934  DATE OF ADMISSION:  07/30/2013 DATE OF DISCHARGE:  08/02/2013  PRESENTING COMPLAINT:  Not her usual self, brought in by family.   DISCHARGE DIAGNOSES: 1.  Acute encephalopathy, improving, likely due to urinary tract infection.  2.  History of chronic cognitive dysfunction, suspect from previous cerebrovascular accident.  3.  History of bipolar disorder per old records and per daughter.  4.  Hyponatremia, improving. 5.  Hypertension.  6.  History of cerebrovascular accident.  7.  Hyperlipidemia.  8.  Adult failure to thrive.   CONDITION ON DISCHARGE: Fair.   MEDICATIONS: 1.  Levaquin 750 p.o. 2 more doses.  2.  Tylenol 650 q.4 p.r.n.  3.  Depakote 750 at bedtime.  4.  Lexapro 10 mg daily.  5.  Trazodone 100 mg at bedtime.  6.  Norvasc 5 mg daily.  7.  Plavix 75 mg daily.  8.  Pravachol 10 mg at bedtime.  9.  Requip 0.5 mg p.o. at bedtime.  10.  Zyprexa 2.5 mg b.i.d.  11.  Spiriva 1 capsule inhalation daily.  12.  Protonix 40 mg daily.   Mechanical soft diet.   A 2 gram sodium diet.   Physical therapy.   CONSULTATIONS:  Dr. Toni Amendlapacs, psychiatry.   H and H are 10.6 and 30.8. Sodium is 129. Potassium is 3.8. UA positive for UTI. Blood cultures negative.   BRIEF SUMMARY OF HOSPITAL COURSE: [name] CageGlera Stuart is a 80 year old Caucasian female who has history of cerebrovascular accident and is much bedbound, not very active at home, who comes in with:  1. Acute encephalopathy, which likely is due to suspected urinary tract infection. Cannot assess the patient's baseline, and the patient is not the best historian. She has chronic cognitive dysfunction, suspect from previous CVA, and history of bipolar disorder, per daughter Annice PihJackie. The patient's caregiver is her husband at home, who helps her with feeding, cleaning her up and routine activity. The patient has been overall declining, per daughter, and family is  long-term care for the patient.  2.  Systemic inflammatory response syndrome secondary to urinary tract infection. Will finish a course of Levaquin. WBC count is normal. Tachycardia improved. No fever noted.  3. Hyponatremia, likely due to hydrochlorothiazide. The patient received IV fluids. She is improving. I have DC'd the hydrochlorothiazide.  4.  Hypertension. The patient was DC'd with her hydrochlorothiazide given her hyponatremia. She is tolerating Norvasc now.  5.  History of cerebrovascular accident, on Plavix and statins.  6.  Hyperlipidemia. Continue statins.   The patient will be discharged to SNF once bed is available.   TIME SPENT: 40 minutes.   ____________________________ Wylie HailSona A. Allena KatzPatel, MD sap:dmm D: 08/02/2013 12:19:26 ET T: 08/02/2013 12:43:31 ET JOB#: [id number]  cc: Meiah Zamudio A. Allena KatzPatel, MD, <Dictator> Willow OraSONA A Courtni Balash MD ELECTRONICALLY SIGNED 08/15/2013 13:18

## 2014-10-25 NOTE — Consult Note (Signed)
Psychiatry: Follow-up visit with this woman who had confusion when she came into the hospital.  Today I found her to be awake and more alert and interactive.  She was able to make eye contact throughout the interview and her speech was much more lucid and understandable than it was yesterday.  She was oriented to her situation, the place and year.  She tells me that she feels like she is no different than she was yesterday.  She denies any hallucinations.  Denies any suicidal or homicidal ideation. asked the patient to help me understand her psychiatric history.  She told me that as far as she was concerned she did not have any psychiatric problems.  She admitted that she had abused substances in the past but didn't think she had any other mental health issues.  As far as she is aware she knows nothing of the diagnosis of "bipolar disorder". no sign of acute mood disorder or psychosis.  Seems to be recovering from her delirium as her medical condition improves.  No current indication for treatment on the psychiatry ward.  No change to medication indicated.  I will follow up as she continues in the hospital.  Electronic Signatures: Julanne Schlueter, Jackquline DenmarkJohn T (MD)  (Signed on 29-Jan-15 20:23)  Authored  Last Updated: 29-Jan-15 20:23 by Audery Amellapacs, Anais Koenen T (MD)

## 2015-01-02 IMAGING — CR DG CHEST 1V PORT
1 series · 1 of 1 positions shown · non-contrast
Comparison: none

REASON FOR EXAM: SOB
COMMENTS:

PROCEDURE:     DXR - DXR PORTABLE CHEST SINGLE VIEW  - September 17, 2012  [DATE]
RESULT:     Comparison made to prior study of 03/14/2011. Lungs clear. Heart
size normal.

[x chest ap]
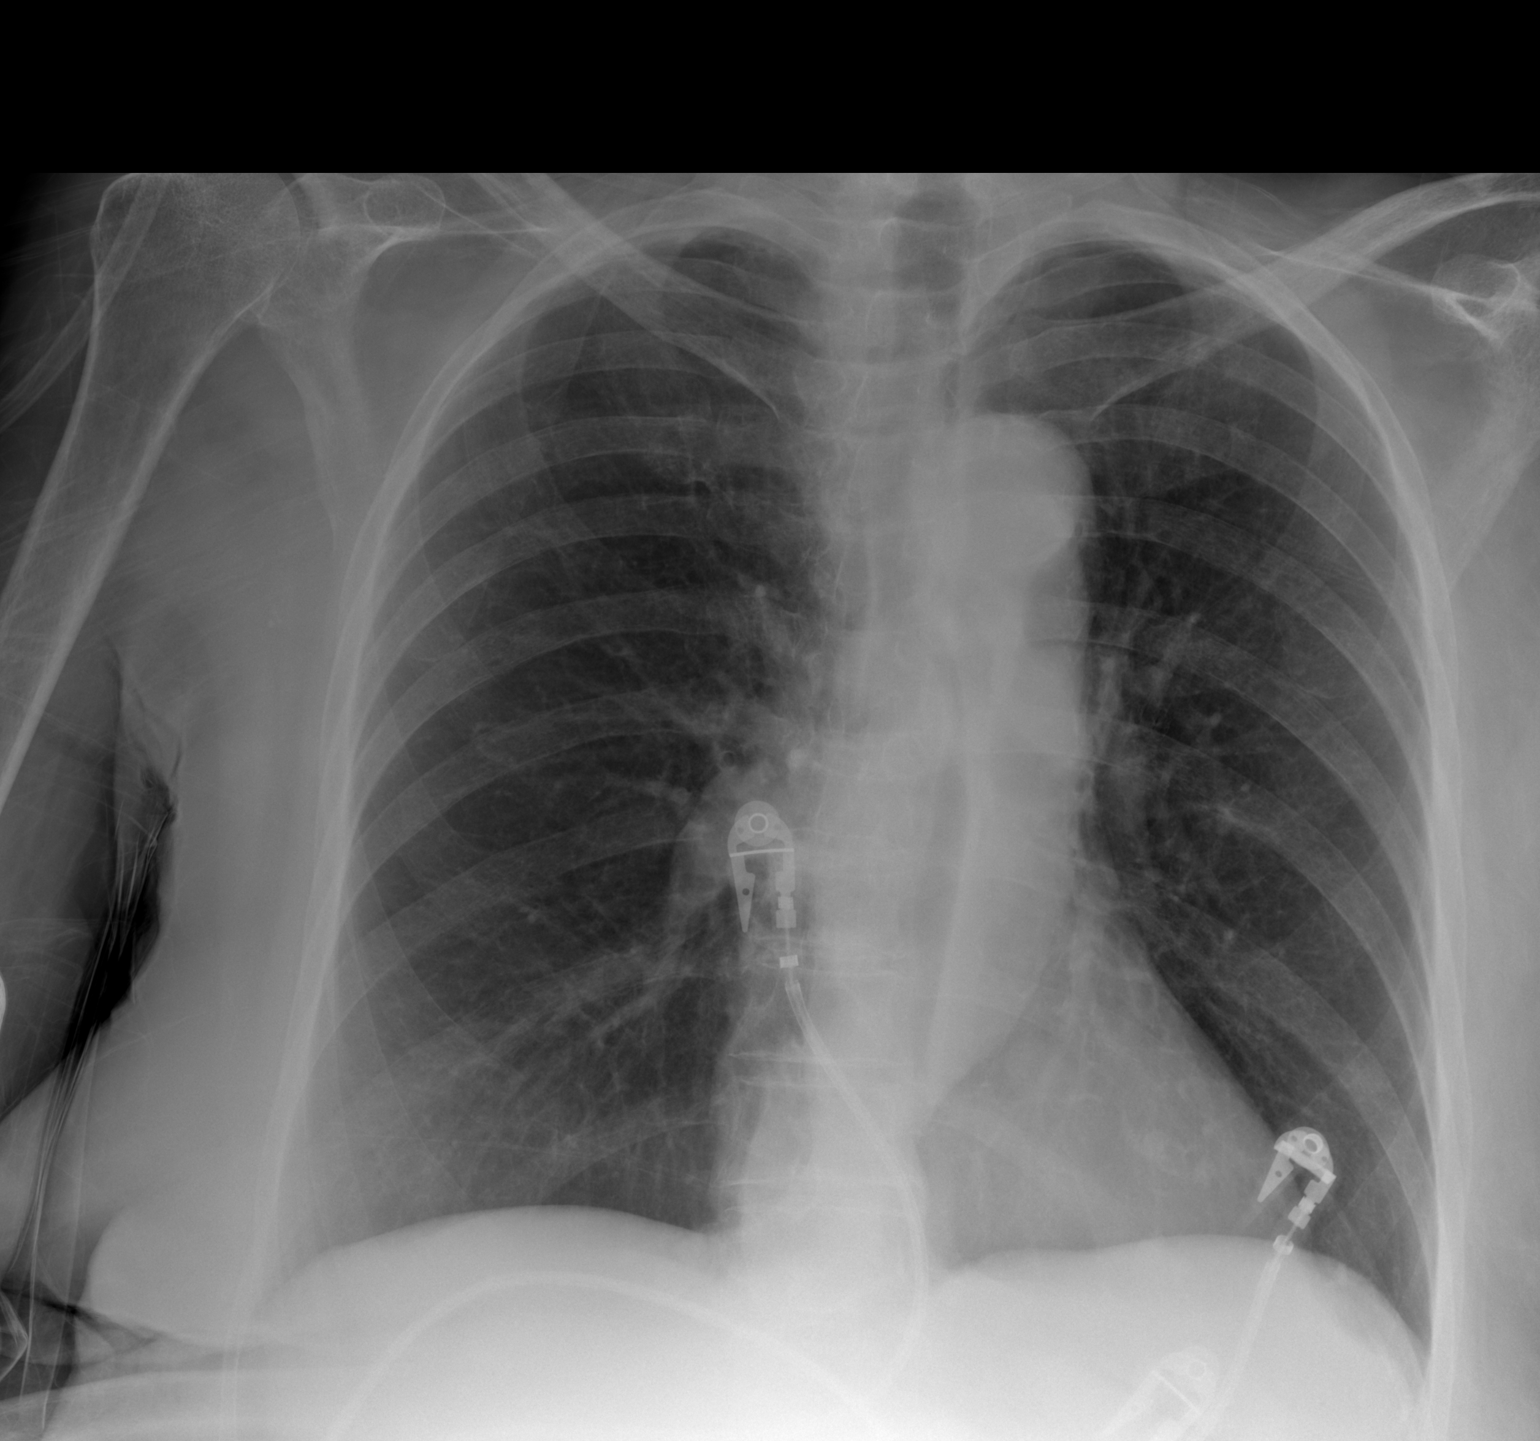

[1 of 1 positions shown; findings below may reference images not displayed]

IMPRESSION: No acute abnormality.

## 2015-02-03 IMAGING — CT CT HEAD WITHOUT CONTRAST
1 series · 16 of 30 positions shown, 20 images · non-contrast
Comparison: none

REASON FOR EXAM: ams
COMMENTS:   May transport without cardiac monitor

PROCEDURE:     CT  - CT HEAD WITHOUT CONTRAST  - October 19, 2012  [DATE]
RESULT:     Comparison:  09/17/2012
TECHNIQUE: Multiple axial images from the foramen magnum to the vertex were
obtained without IV contrast.

[Series 2: soft tissue · axial · 0.43mm/px · z∈[-98,+48]mm · 16 of 33 slices shown, 20 images]
[im 2/33  brain]
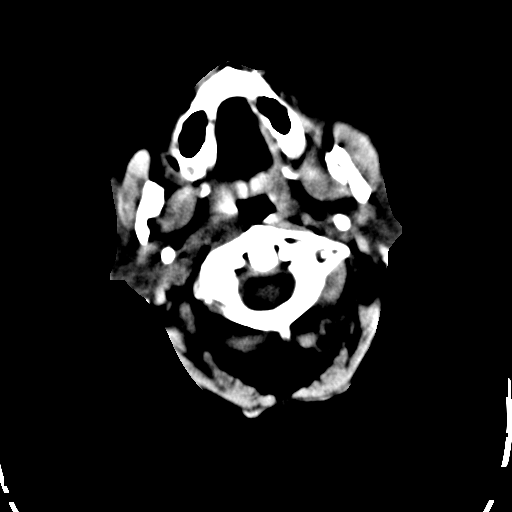
[im 2/33  bone]
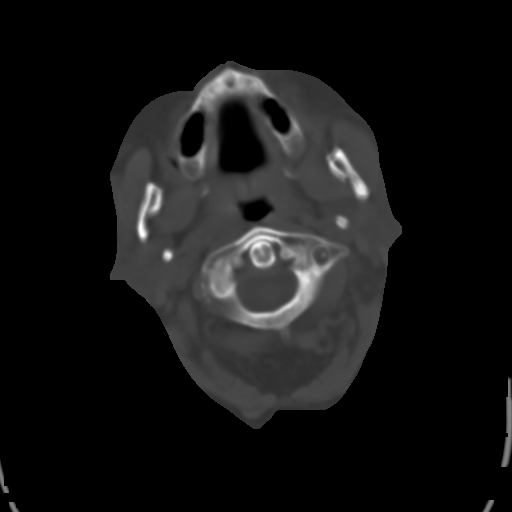
[im 4/33  brain]
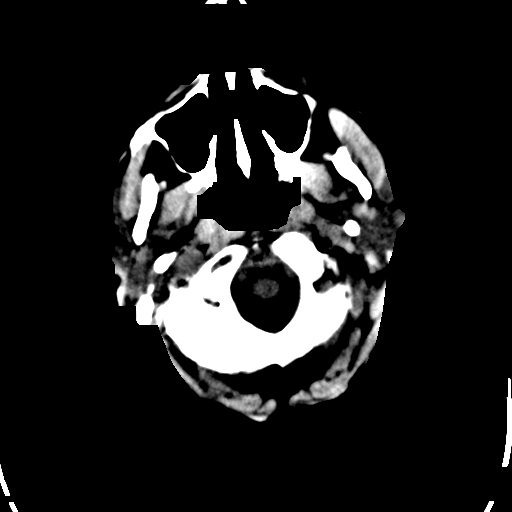
[im 6/33  brain]
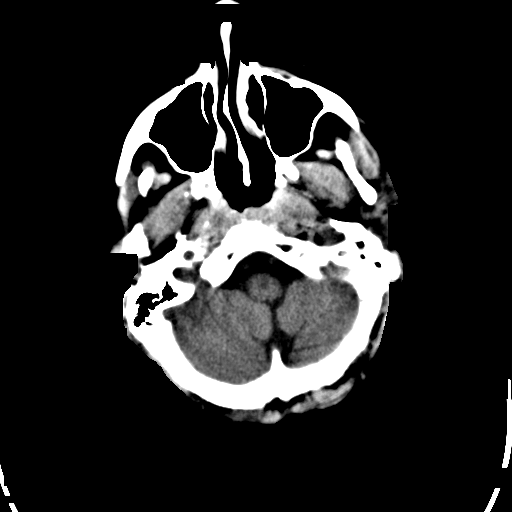
[im 8/33  brain]
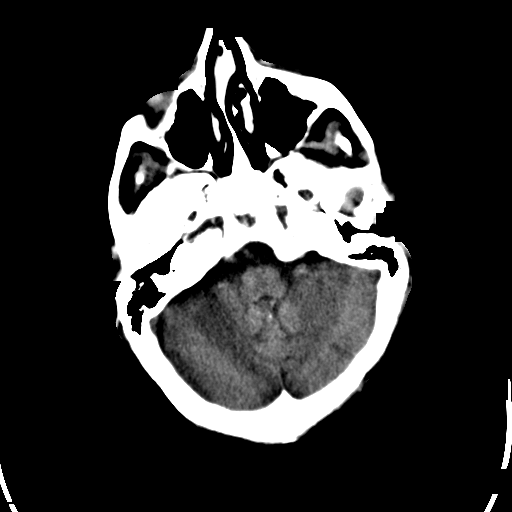
[im 9/33  brain]
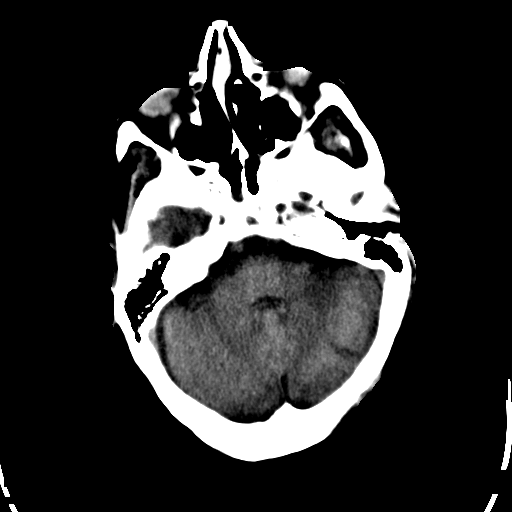
[im 9/33  bone]
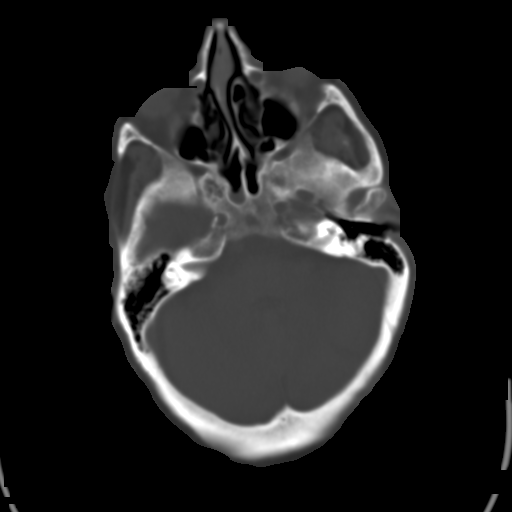
[im 12/33  brain]
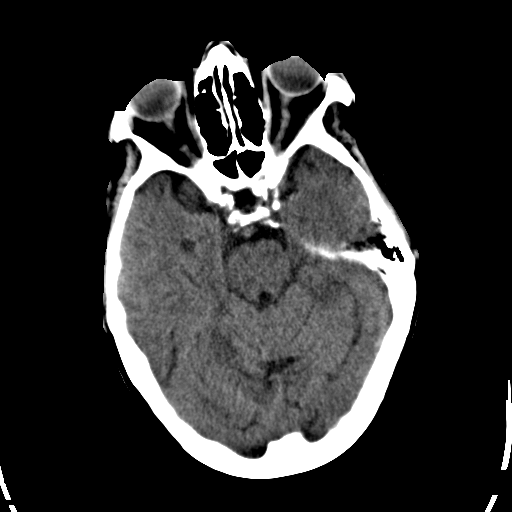
[im 14/33  brain]
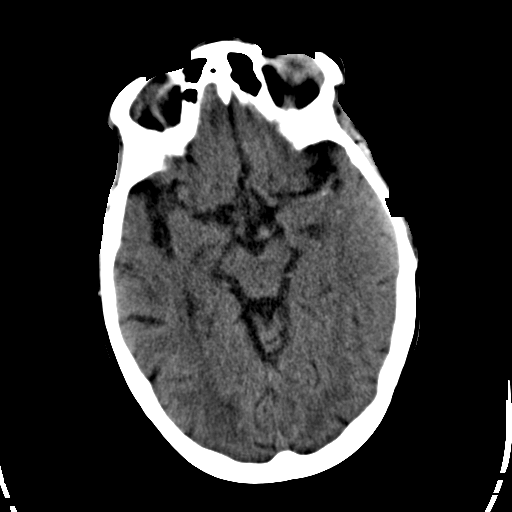
[im 16/33  brain]
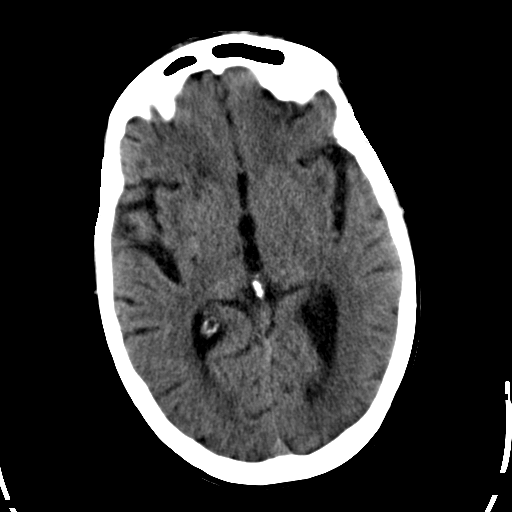
[im 17/33  brain]
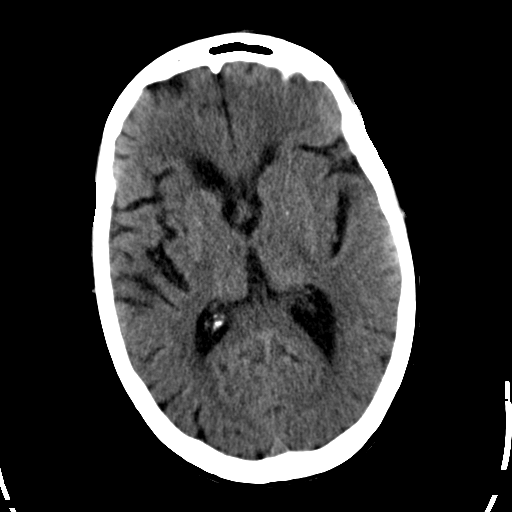
[im 17/33  bone]
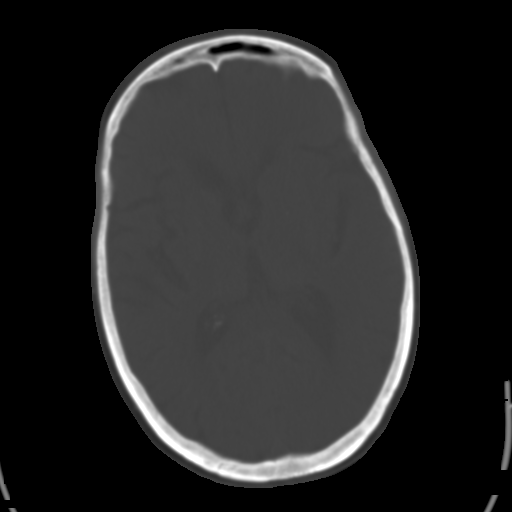
[im 19/33  brain]
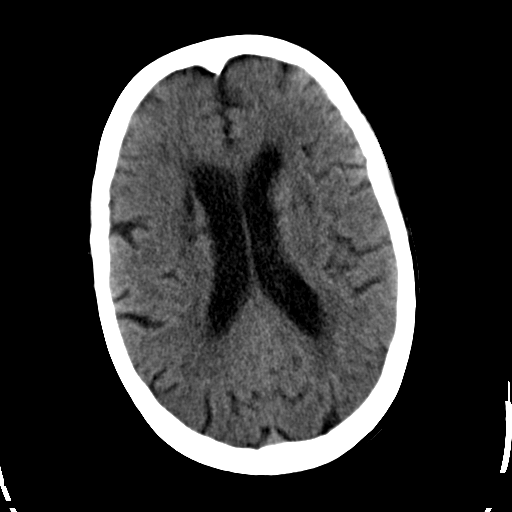
[im 21/33  brain]
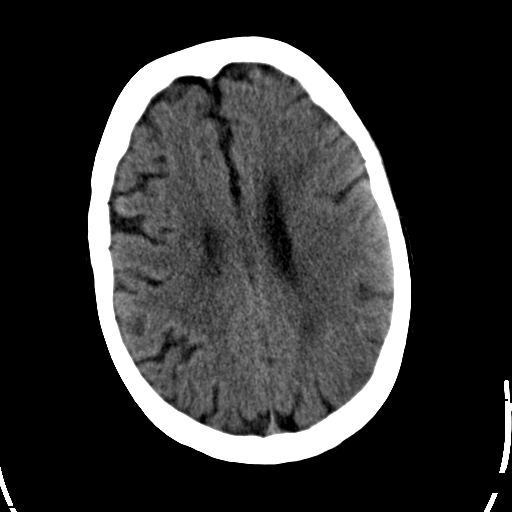
[im 24/33  brain]
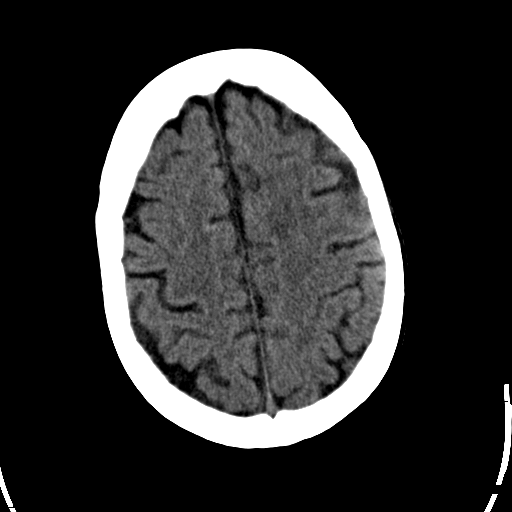
[im 25/33  brain]
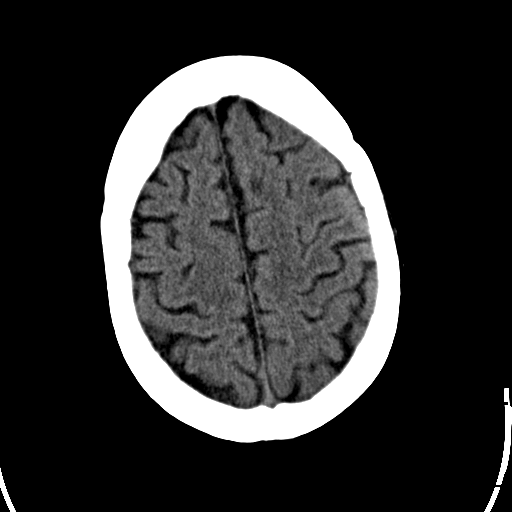
[im 25/33  bone]
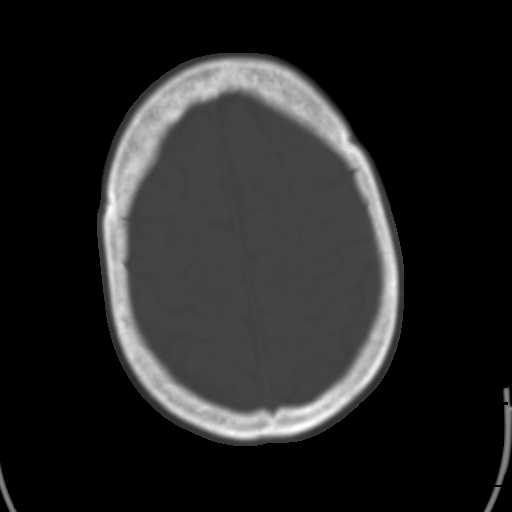
[im 27/33  brain]
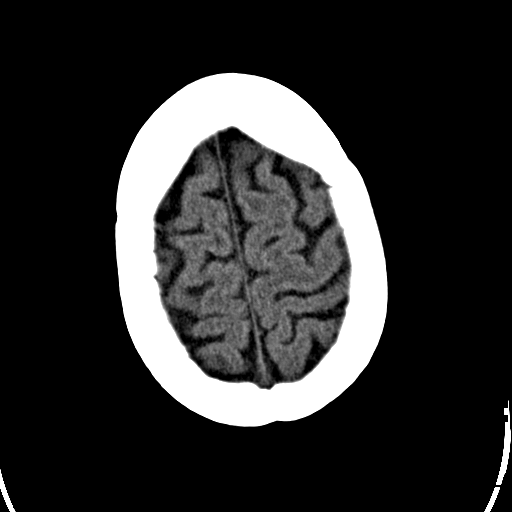
[im 29/33  brain]
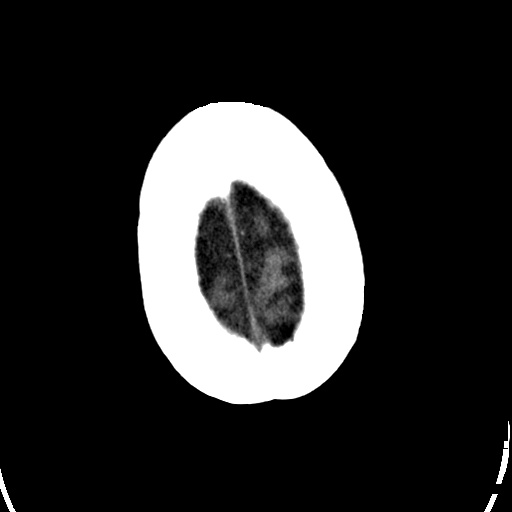
[im 31/33  brain]
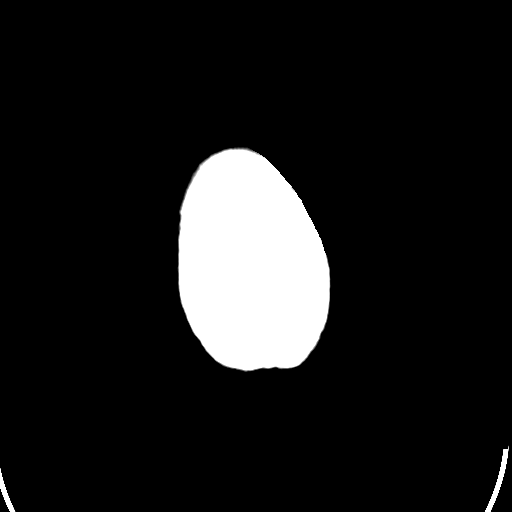

[16 of 30 positions shown; findings below may reference images not displayed]

FINDINGS: There is no evidence for mass effect, midline shift, or extra-axial fluid
collections. There is no evidence for space-occupying lesion, intracranial
hemorrhage, or cortical-based area of infarction. Periventricular and
subcortical hypoattenuation is consistent with chronic small vessel ischemic
disease. Old lacunar infarcts are seen in the right basal ganglia and corona
radiata.

The osseous structures are unremarkable.
IMPRESSION: 1. No acute intracranial process.
2. Chronic small vessel ischemic disease.

## 2015-05-03 ENCOUNTER — Encounter: Payer: Self-pay | Admitting: Emergency Medicine

## 2015-05-03 ENCOUNTER — Emergency Department: Payer: Medicare Other

## 2015-05-03 ENCOUNTER — Emergency Department
Admission: EM | Admit: 2015-05-03 | Discharge: 2015-05-03 | Disposition: A | Discharge status: Home / Self Care | Payer: Medicare Other | Attending: Emergency Medicine | Admitting: Emergency Medicine

## 2015-05-03 DIAGNOSIS — R4781 Slurred speech: Secondary | ICD-10-CM | POA: Insufficient documentation

## 2015-05-03 DIAGNOSIS — R0989 Other specified symptoms and signs involving the circulatory and respiratory systems: Secondary | ICD-10-CM | POA: Diagnosis not present

## 2015-05-03 DIAGNOSIS — Z792 Long term (current) use of antibiotics: Secondary | ICD-10-CM | POA: Insufficient documentation

## 2015-05-03 DIAGNOSIS — Z880 Allergy status to penicillin: Secondary | ICD-10-CM | POA: Insufficient documentation

## 2015-05-03 DIAGNOSIS — I10 Essential (primary) hypertension: Secondary | ICD-10-CM | POA: Insufficient documentation

## 2015-05-03 DIAGNOSIS — T17308A Unspecified foreign body in larynx causing other injury, initial encounter: Secondary | ICD-10-CM

## 2015-05-03 DIAGNOSIS — Z79899 Other long term (current) drug therapy: Secondary | ICD-10-CM | POA: Diagnosis not present

## 2015-05-03 DIAGNOSIS — R05 Cough: Secondary | ICD-10-CM | POA: Diagnosis present

## 2015-05-03 MED ORDER — ACETAMINOPHEN 325 MG PO TABS
650.0000 mg | ORAL_TABLET | Freq: Once | ORAL | Status: AC
  Administered 2015-05-03: 650 mg via ORAL
  Filled 2015-05-03: qty 2

## 2015-05-03 NOTE — ED Provider Notes (Signed)
[facility] Emergency Department Provider Note    ____________________________________________  Time seen: 1305  I have reviewed the triage vital signs and the nursing notes.   HISTORY  Chief Complaint Cough   History limited by: Not Limited   HPI [name] Stuart is a 80 y.o. female with history of CVA who presents to the emergency department today after a choking episode. The family states that the patient is supposed be on a mechanical soft diet however they were told that they could bring the patient food. They do do Sunday. With the patient. Today they brought a [place] sandwich. The patient took a bite and then had a choking episode. They think it lasted roughly 10 minutes. The patient at this time states that she feels like she is not having any difficulty with breathing. She states that she has been able to swallow her saliva without any difficulty. The patient is being treated for pneumonia at this time and family states she is on antibiotics. The patient states she is not having any increased of culture with breathing.   Past Medical History  Diagnosis Date  . Other specified rehabilitation procedure(V57.89)   . Unspecified constipation   . Esophageal reflux   . Chronic airway obstruction, not elsewhere classified   . Other specified disorder of skin   . Other symptoms involving skin and integumentary tissues   . Difficulty in walking(719.7)   . Muscle weakness (generalized)   . Dysphagia, oropharyngeal phase   . Cognitive communication deficit   . Anxiety state, unspecified   . Unspecified essential hypertension   . Unspecified late effects of cerebrovascular disease   . Depressive disorder, not elsewhere classified   . Other and unspecified hyperlipidemia   . Restless legs syndrome (RLS)   . Other chronic pain   . Insomnia, unspecified   . Pressure ulcer, buttock(707.05)   . Unspecified episodic mood disorder     There are no active  problems to display for this patient.   Past Surgical History  Procedure Laterality Date  . Cholecystectomy    . Bony pelvis surgery      Current Outpatient Rx  Name  Route  Sig  Dispense  Refill  . acetaminophen (TYLENOL) 325 MG tablet   Oral   Take 650 mg by mouth every 4 (four) hours as needed.         . ALPRAZolam (XANAX) 0.25 MG tablet      0.25 mg. Take one tablet by mouth every six hours         . amLODipine (NORVASC) 2.5 MG tablet   Oral   Take 2.5 mg by mouth daily.         . clopidogrel (PLAVIX) 75 MG tablet   Oral   Take 75 mg by mouth daily with breakfast.         . divalproex (DEPAKOTE) 250 MG DR tablet   Oral   Take 250 mg by mouth 3 (three) times daily.         Marland Kitchen escitalopram (LEXAPRO) 10 MG tablet   Oral   Take 10 mg by mouth daily.         . miconazole (MICOTIN) 2 % cream   Topical   Apply 1 application topically 2 (two) times daily.         Marland Kitchen nystatin (MYCOSTATIN) powder   Topical   Apply topically 2 (two) times daily.         Marland Kitchen OLANZapine (ZYPREXA) 2.5  MG tablet   Oral   Take 2.5 mg by mouth 2 (two) times daily.         Marland Kitchen omeprazole (PRILOSEC) 20 MG capsule   Oral   Take 20 mg by mouth daily.         . polyethylene glycol (MIRALAX / GLYCOLAX) packet   Oral   Take 17 g by mouth daily.         . pravastatin (PRAVACHOL) 10 MG tablet   Oral   Take 10 mg by mouth daily.         Marland Kitchen rOPINIRole (REQUIP) 0.5 MG tablet   Oral   Take 0.5 mg by mouth daily.         Marland Kitchen tiotropium (SPIRIVA) 18 MCG inhalation capsule   Inhalation   Place 18 mcg into inhaler and inhale daily.         . traZODone (DESYREL) 100 MG tablet   Oral   Take 100 mg by mouth at bedtime.           Allergies Aspirin; Codeine sulfate; Ibuprofen; and Penicillins  History reviewed. No pertinent family history.  Social History Social History  Substance Use Topics  . Smoking status: Never Smoker   . Smokeless tobacco: None  . Alcohol  Use: No    Review of Systems  Constitutional: Negative for fever. Cardiovascular: Negative for chest pain. Respiratory: Negative for shortness of breath. Positive for cough. Gastrointestinal: Negative for abdominal pain, vomiting and diarrhea. Genitourinary: Negative for dysuria. Musculoskeletal: Negative for back pain. Skin: Negative for rash. Neurological: Negative for headaches, focal weakness or numbness.   10-point ROS otherwise negative.  ____________________________________________   PHYSICAL EXAM:  VITAL SIGNS: ED Triage Vitals  Enc Vitals Group     BP 05/03/15 1225 146/111 mmHg     Pulse Rate 05/03/15 1225 96     Resp 05/03/15 1225 18     Temp 05/03/15 1225 98.1 F (36.7 C)     Temp Source 05/03/15 1225 Oral     SpO2 05/03/15 1225 92 %     Weight 05/03/15 1225 175 lb 8 oz (79.606 kg)     Height 05/03/15 1225  (1.727 m)   Constitutional: Alert and oriented.  Eyes: Conjunctivae are normal. PERRL. Normal extraocular movements. ENT   Head: Normocephalic and atraumatic.   Nose: No congestion/rhinnorhea.   Mouth/Throat: Mucous membranes are moist.   Neck: No stridor. Hematological/Lymphatic/Immunilogical: No cervical lymphadenopathy. Cardiovascular: Normal rate, regular rhythm.  No murmurs, rubs, or gallops. Respiratory: Normal respiratory effort without tachypnea nor retractions. Breath sounds are clear and equal bilaterally. No wheezes/rales/rhonchi. Gastrointestinal: Soft and nontender. No distention.  Genitourinary: Deferred Musculoskeletal: Normal range of motion in all extremities. No joint effusions.  No lower extremity tenderness nor edema. Neurologic:  Slightly slurred speech. Skin:  Skin is warm, dry and intact. No rash noted. Psychiatric: Mood and affect are normal. Speech and behavior are normal. Patient exhibits appropriate insight and judgment.  ____________________________________________    LABS (pertinent  positives/negatives)  None  ____________________________________________   EKG  None  ____________________________________________    RADIOLOGY  CXR IMPRESSION: No active cardiopulmonary disease.  I, Jamarr Treinen, personally viewed and evaluated these images (plain radiographs) as part of my medical decision making. ____________________________________________   PROCEDURES  Procedure(s) performed: None  Critical Care performed: No  ____________________________________________   INITIAL IMPRESSION / ASSESSMENT AND PLAN / ED COURSE  Pertinent labs & imaging results that were available during my care of the patient were  reviewed by me and considered in my medical decision making (see chart for details).  Patient presents to the emergency department after a choking episode. Chest x-ray did not show any concerning findings. Patient in no distress. She was able to tolerate by mouth without any problems choking.  ____________________________________________   FINAL CLINICAL IMPRESSION(S) / ED DIAGNOSES  Final diagnoses:  Choking, initial encounter     Phineas SemenGraydon Sriram Febles, MD 05/03/15 1452

## 2015-05-03 NOTE — ED Notes (Addendum)
Pt to ED via EMS from [facility] nursing [facility], states that she choked while eating a sandwich and family wanted her to come and be evaluated. Pt is A&Ox4, no respiratory distress at this time. Pt reports recently being on penicillin for PNA despite PCN allergy. Presents with strong congested cough. Pt has hx of COPD and old CVA resulting in left-sided weakness. Pt has small amounts of old blood in mouth d/t family member attempting removal of food.

## 2015-05-03 NOTE — Discharge Instructions (Signed)
Please seek medical attention for any high fevers, chest pain, shortness of breath, change in behavior, persistent vomiting, bloody stool or any other new or concerning symptoms.  Choking, Adult Choking occurs when a food or object gets stuck in the throat or trachea, blocking the airway. If the airway is partly blocked, coughing will usually cause the food or object to come out. If the airway is completely blocked, immediate action is needed to help it come out. A complete airway blockage is life threatening because it causes breathing to stop. Choking is a true medical emergency that requires fast, appropriate action by anyone available. SIGNS OF AIRWAY BLOCKAGE There is a partial airway blockage if you or the person who is choking is:   Able to breathe and speak.  Coughing loudly.  Making loud noises. There is a complete airway blockage if you or the person who is choking is:   Unable to breathe.  Making soft or high-pitched sounds while breathing.  Unable to cough or coughing weakly, ineffectively, or silently.  Unable to cry, speak, or make sounds.  Turning blue.  Holding the neck with both arms. This is the universal sign of choking. WHAT TO DO IF CHOKING OCCURS If there is a partial airway blockage, allow coughing to clear the airway. Do not try to drink until the food or object comes out. If someone else has a partial airway blockage, do not interfere. Stay with him or her and watch for signs of complete airway blockage until the food or object comes out.  If there is a complete airway blockage or if there is a partial airway blockage and the food or object does not come out, perform abdominal thrusts (also referred to as the Heimlich maneuver). Abdominal thrusts are used to create an artificial cough to try to clear the airway. Performing abdominal thrusts is part of a series of steps that should be done to help someone who is choking. Abdominal thrusts are usually done by someone  else, but if you are alone, you can perform abdominal thrusts on yourself. Follow the procedure below that best fits your situation.  IF SOMEONE ELSE IS CHOKING: For a conscious adult:  1. Ask the person whether he or she is choking. If the person nods, continue to step 2. 2. Stand or kneel behind the person and lean him or her forward slightly. 3. Make a fist with 1 hand, put your arms around the person, and grasp your fist with your other hand. Place the thumb side of your fist in the person's abdomen, just below the ribs. 4. Press inward and upward with both hands. 5. Repeat this maneuver until the object comes out and the person is able to breathe or until the person loses consciousness. For an unconscious adult: 1. Shout for help. If someone responds, have him or her call local emergency services (911 in [place] If no one responds, call local emergency services yourself if possible. 2. Begin CPR, starting with compressions. Every time you open the airway to give rescue breaths, open the person's mouth. If you can see the food or object and it can be easily pulled out, remove it with your fingers. 3. After 5 cycles or 2 minutes of CPR, call local emergency services (911 in [place] if you or someone else did not already call. For a conscious adult who is obese or in the later stages of pregnancy: Abdominal thrusts may not be effective when helping people who are in the later stages  of pregnancy or who are obese. In these instances, chest thrusts can be used.  1. Ask the person whether he or she is choking. If the person nods and has signs of complete airway blockage, continue to step 2. 2. Stand behind the person and wrap your arms around his or her chest (with your arms under the person's armpits). 3. Make a fist with 1 hand. Place the thumb side of your fist on the middle of the person's breastbone. 4. Grab your fist with your other hand and thrust backward. Continue this until the object comes  out or until the person becomes unconscious. For an unconscious adult who is obese or in the later stages of pregnancy:  1. Shout for help. If someone responds, have him or her call local emergency services (911 in [place] If no one responds, call local emergency services yourself if possible. 2. Begin CPR, starting with compressions. Every time you open the airway to give rescue breaths, open the person's mouth. If you can see the food or object and it can be easily pulled out, remove it with your fingers. 3. After 5 cycles or 2 minutes of CPR, call local emergency services (911 in [place] if you or someone else did not already call. Note that abdominal thrusts (below the rib cage) should be used for a pregnant woman if possible. This should be possible until the later stages of pregnancy when there is no longer enough room between the enlarging uterus and the rib cage to perform the maneuver. At that point, chest thrusts must be used as described. IF YOU ARE CHOKING: 1. Call local emergency services (911 in [place] if near a landline. Do not worry about communicating what is happening. Do not hang up the phone. Someone may be sent to help you anyway. 2. Make a fist with 1 hand. Put the thumb side of the fist against your stomach, just above the belly button and well below the breastbone. If you are pregnant or obese, put your fist on your chest instead, just below the breastbone and just above your lowest ribs. 3. Hold your fist with your other hand and bend over a hard surface, such as a table or chair. 4. Forcefully push your fist in and up. 5. Continue to do this until the food or object comes out. PREVENTION  To be prepared if choking occurs, learn how to correctly perform abdominal thrusts and give CPR by taking a certified first-aid training course.  SEEK IMMEDIATE MEDICAL CARE IF:  You have a fever after choking stops.  You have problems breathing after choking stops.  You received the  Heimlich maneuver. MAKE SURE YOU:  Understand these instructions.  Watch your condition.  Get help right away if you are not doing well or get worse.   This information is not intended to replace advice given to you by your health care provider. Make sure you discuss any questions you have with your health care provider.   Document Released: 07/28/2004 Document Revised: 07/11/2014 Document Reviewed: 01/31/2012 Elsevier Interactive Patient Education [employer]

## 2015-05-03 NOTE — ED Notes (Signed)
Family at bedside, very tearful.

## 2015-05-03 NOTE — ED Notes (Signed)
Pt to return to [facility] via EMS.

## 2015-05-03 NOTE — ED Notes (Signed)
EMS arrived to transport pt back to [facility] nursing [facility]. Discussed discharge instructions and follow-up care with patient. No questions or concerns at this time. Pt stable at discharge.

## 2015-05-27 ENCOUNTER — Inpatient Hospital Stay: Payer: Medicare Other

## 2015-05-27 ENCOUNTER — Encounter: Payer: Self-pay | Admitting: Emergency Medicine

## 2015-05-27 ENCOUNTER — Inpatient Hospital Stay
Admission: EM | Admit: 2015-05-27 | Discharge: 2015-06-05 | DRG: 871 | Disposition: A | Discharge status: Skilled Nursing Facility | Payer: Medicare Other | Attending: Internal Medicine | Admitting: Internal Medicine

## 2015-05-27 ENCOUNTER — Emergency Department: Payer: Medicare Other

## 2015-05-27 DIAGNOSIS — Z808 Family history of malignant neoplasm of other organs or systems: Secondary | ICD-10-CM

## 2015-05-27 DIAGNOSIS — T8579XA Infection and inflammatory reaction due to other internal prosthetic devices, implants and grafts, initial encounter: Secondary | ICD-10-CM

## 2015-05-27 DIAGNOSIS — Z8249 Family history of ischemic heart disease and other diseases of the circulatory system: Secondary | ICD-10-CM | POA: Diagnosis not present

## 2015-05-27 DIAGNOSIS — Z01818 Encounter for other preprocedural examination: Secondary | ICD-10-CM

## 2015-05-27 DIAGNOSIS — T884XXA Failed or difficult intubation, initial encounter: Secondary | ICD-10-CM

## 2015-05-27 DIAGNOSIS — J690 Pneumonitis due to inhalation of food and vomit: Secondary | ICD-10-CM | POA: Diagnosis present

## 2015-05-27 DIAGNOSIS — R0600 Dyspnea, unspecified: Secondary | ICD-10-CM

## 2015-05-27 DIAGNOSIS — Z4659 Encounter for fitting and adjustment of other gastrointestinal appliance and device: Secondary | ICD-10-CM

## 2015-05-27 DIAGNOSIS — T17890D Other foreign object in other parts of respiratory tract causing asphyxiation, subsequent encounter: Secondary | ICD-10-CM | POA: Diagnosis not present

## 2015-05-27 DIAGNOSIS — J9601 Acute respiratory failure with hypoxia: Secondary | ICD-10-CM

## 2015-05-27 DIAGNOSIS — Y95 Nosocomial condition: Secondary | ICD-10-CM | POA: Diagnosis present

## 2015-05-27 DIAGNOSIS — T17528A Food in bronchus causing other injury, initial encounter: Secondary | ICD-10-CM | POA: Diagnosis present

## 2015-05-27 DIAGNOSIS — R6520 Severe sepsis without septic shock: Secondary | ICD-10-CM | POA: Diagnosis present

## 2015-05-27 DIAGNOSIS — I10 Essential (primary) hypertension: Secondary | ICD-10-CM | POA: Diagnosis present

## 2015-05-27 DIAGNOSIS — E871 Hypo-osmolality and hyponatremia: Secondary | ICD-10-CM

## 2015-05-27 DIAGNOSIS — J441 Chronic obstructive pulmonary disease with (acute) exacerbation: Secondary | ICD-10-CM | POA: Diagnosis present

## 2015-05-27 DIAGNOSIS — E872 Acidosis, unspecified: Secondary | ICD-10-CM

## 2015-05-27 DIAGNOSIS — E785 Hyperlipidemia, unspecified: Secondary | ICD-10-CM | POA: Diagnosis present

## 2015-05-27 DIAGNOSIS — F3160 Bipolar disorder, current episode mixed, unspecified: Secondary | ICD-10-CM | POA: Diagnosis present

## 2015-05-27 DIAGNOSIS — J9621 Acute and chronic respiratory failure with hypoxia: Secondary | ICD-10-CM | POA: Diagnosis present

## 2015-05-27 DIAGNOSIS — J90 Pleural effusion, not elsewhere classified: Secondary | ICD-10-CM | POA: Diagnosis present

## 2015-05-27 DIAGNOSIS — T17890A Other foreign object in other parts of respiratory tract causing asphyxiation, initial encounter: Secondary | ICD-10-CM

## 2015-05-27 DIAGNOSIS — E876 Hypokalemia: Secondary | ICD-10-CM | POA: Diagnosis present

## 2015-05-27 DIAGNOSIS — R001 Bradycardia, unspecified: Secondary | ICD-10-CM | POA: Diagnosis not present

## 2015-05-27 DIAGNOSIS — R739 Hyperglycemia, unspecified: Secondary | ICD-10-CM | POA: Diagnosis present

## 2015-05-27 DIAGNOSIS — R600 Localized edema: Secondary | ICD-10-CM | POA: Diagnosis present

## 2015-05-27 DIAGNOSIS — F411 Generalized anxiety disorder: Secondary | ICD-10-CM | POA: Diagnosis present

## 2015-05-27 DIAGNOSIS — Z886 Allergy status to analgesic agent status: Secondary | ICD-10-CM | POA: Diagnosis not present

## 2015-05-27 DIAGNOSIS — I69391 Dysphagia following cerebral infarction: Secondary | ICD-10-CM | POA: Diagnosis not present

## 2015-05-27 DIAGNOSIS — J189 Pneumonia, unspecified organism: Secondary | ICD-10-CM | POA: Diagnosis not present

## 2015-05-27 DIAGNOSIS — Z79899 Other long term (current) drug therapy: Secondary | ICD-10-CM | POA: Diagnosis not present

## 2015-05-27 DIAGNOSIS — I69354 Hemiplegia and hemiparesis following cerebral infarction affecting left non-dominant side: Secondary | ICD-10-CM | POA: Diagnosis not present

## 2015-05-27 DIAGNOSIS — F17210 Nicotine dependence, cigarettes, uncomplicated: Secondary | ICD-10-CM | POA: Diagnosis present

## 2015-05-27 DIAGNOSIS — A419 Sepsis, unspecified organism: Secondary | ICD-10-CM

## 2015-05-27 DIAGNOSIS — T17920A Food in respiratory tract, part unspecified causing asphyxiation, initial encounter: Secondary | ICD-10-CM | POA: Insufficient documentation

## 2015-05-27 DIAGNOSIS — T17928A Food in respiratory tract, part unspecified causing other injury, initial encounter: Secondary | ICD-10-CM | POA: Insufficient documentation

## 2015-05-27 DIAGNOSIS — E7800 Pure hypercholesterolemia, unspecified: Secondary | ICD-10-CM | POA: Diagnosis present

## 2015-05-27 DIAGNOSIS — G2581 Restless legs syndrome: Secondary | ICD-10-CM | POA: Diagnosis present

## 2015-05-27 DIAGNOSIS — I959 Hypotension, unspecified: Secondary | ICD-10-CM | POA: Diagnosis present

## 2015-05-27 DIAGNOSIS — K219 Gastro-esophageal reflux disease without esophagitis: Secondary | ICD-10-CM | POA: Diagnosis present

## 2015-05-27 DIAGNOSIS — J9690 Respiratory failure, unspecified, unspecified whether with hypoxia or hypercapnia: Secondary | ICD-10-CM

## 2015-05-27 DIAGNOSIS — R918 Other nonspecific abnormal finding of lung field: Secondary | ICD-10-CM

## 2015-05-27 DIAGNOSIS — W44F3XA Food entering into or through a natural orifice, initial encounter: Secondary | ICD-10-CM | POA: Insufficient documentation

## 2015-05-27 DIAGNOSIS — Z7902 Long term (current) use of antithrombotics/antiplatelets: Secondary | ICD-10-CM

## 2015-05-27 DIAGNOSIS — F0390 Unspecified dementia without behavioral disturbance: Secondary | ICD-10-CM | POA: Diagnosis present

## 2015-05-27 DIAGNOSIS — J440 Chronic obstructive pulmonary disease with acute lower respiratory infection: Secondary | ICD-10-CM | POA: Diagnosis present

## 2015-05-27 DIAGNOSIS — I5031 Acute diastolic (congestive) heart failure: Secondary | ICD-10-CM | POA: Diagnosis not present

## 2015-05-27 HISTORY — DX: Cerebral infarction, unspecified: I63.9

## 2015-05-27 LAB — MAGNESIUM: Magnesium: 1.2 mg/dL — ABNORMAL LOW (ref 1.7–2.4)

## 2015-05-27 LAB — CBC
HCT: 43 % (ref 35.0–47.0)
Hemoglobin: 14.2 g/dL (ref 12.0–16.0)
MCH: 29.9 pg (ref 26.0–34.0)
MCHC: 33 g/dL (ref 32.0–36.0)
MCV: 90.7 fL (ref 80.0–100.0)
PLATELETS: 325 10*3/uL (ref 150–440)
RBC: 4.73 MIL/uL (ref 3.80–5.20)
RDW: 14.6 % — AB (ref 11.5–14.5)
WBC: 18 10*3/uL — AB (ref 3.6–11.0)

## 2015-05-27 LAB — URINALYSIS COMPLETE WITH MICROSCOPIC (ARMC ONLY)
BILIRUBIN URINE: NEGATIVE
Bacteria, UA: NONE SEEN
Glucose, UA: NEGATIVE mg/dL
HGB URINE DIPSTICK: NEGATIVE
LEUKOCYTES UA: NEGATIVE
Nitrite: NEGATIVE
PH: 5 (ref 5.0–8.0)
Protein, ur: NEGATIVE mg/dL
SPECIFIC GRAVITY, URINE: 1.02 (ref 1.005–1.030)

## 2015-05-27 LAB — PHOSPHORUS: Phosphorus: 2.4 mg/dL — ABNORMAL LOW (ref 2.5–4.6)

## 2015-05-27 LAB — COMPREHENSIVE METABOLIC PANEL
ALBUMIN: 2.1 g/dL — AB (ref 3.5–5.0)
ALK PHOS: 102 U/L (ref 38–126)
ALT: 6 U/L — AB (ref 14–54)
ANION GAP: 8 (ref 5–15)
AST: 17 U/L (ref 15–41)
BILIRUBIN TOTAL: 2 mg/dL — AB (ref 0.3–1.2)
BUN: 8 mg/dL (ref 6–20)
CALCIUM: 5.8 mg/dL — AB (ref 8.9–10.3)
CO2: 20 mmol/L — AB (ref 22–32)
CREATININE: 0.62 mg/dL (ref 0.44–1.00)
Chloride: 98 mmol/L — ABNORMAL LOW (ref 101–111)
GFR calc Af Amer: >60 mL/min (ref >60)
GFR calc non Af Amer: >60 mL/min (ref >60)
GLUCOSE: 577 mg/dL — AB (ref 65–99)
Potassium: 3.2 mmol/L — ABNORMAL LOW (ref 3.5–5.1)
SODIUM: 126 mmol/L — AB (ref 135–145)
TOTAL PROTEIN: 4.7 g/dL — AB (ref 6.5–8.1)

## 2015-05-27 LAB — GLUCOSE, CAPILLARY
GLUCOSE-CAPILLARY: 95 mg/dL (ref 65–99)
Glucose-Capillary: 131 mg/dL — ABNORMAL HIGH (ref 65–99)
Glucose-Capillary: 122 mg/dL — ABNORMAL HIGH (ref 65–99)

## 2015-05-27 LAB — MRSA PCR SCREENING: MRSA BY PCR: NEGATIVE

## 2015-05-27 LAB — LACTIC ACID, PLASMA
LACTIC ACID, VENOUS: 4.4 mmol/L — AB (ref 0.5–2.0)
LACTIC ACID, VENOUS: 2.3 mmol/L — AB (ref 0.5–2.0)

## 2015-05-27 MED ORDER — ETOMIDATE 2 MG/ML IV SOLN
20.0000 mg | Freq: Once | INTRAVENOUS | Status: AC
  Administered 2015-05-27: 20 mg via INTRAVENOUS

## 2015-05-27 MED ORDER — POLYETHYLENE GLYCOL 3350 17 G PO PACK: 17.0000 g | PACK | Freq: Every day | ORAL | Status: DC | PRN

## 2015-05-27 MED ORDER — CHLORHEXIDINE GLUCONATE 0.12% ORAL RINSE (MEDLINE KIT)
15.0000 mL | Freq: Two times a day (BID) | OROMUCOSAL | Status: DC
  Administered 2015-05-27 – 2015-06-05 (×15): 15 mL via OROMUCOSAL
  Filled 2015-05-27 (×20): qty 15

## 2015-05-27 MED ORDER — PRAVASTATIN SODIUM 10 MG PO TABS
10.0000 mg | ORAL_TABLET | Freq: Every day | ORAL | Status: DC
  Administered 2015-05-27 – 2015-06-04 (×9): 10 mg
  Filled 2015-05-27 (×9): qty 1

## 2015-05-27 MED ORDER — LORAZEPAM 2 MG/ML IJ SOLN
INTRAMUSCULAR | Status: AC
  Administered 2015-05-27: 0.5 mg via INTRAVENOUS
  Filled 2015-05-27: qty 1

## 2015-05-27 MED ORDER — IPRATROPIUM-ALBUTEROL 0.5-2.5 (3) MG/3ML IN SOLN
3.0000 mL | Freq: Once | RESPIRATORY_TRACT | Status: AC
  Administered 2015-05-27: 3 mL via RESPIRATORY_TRACT

## 2015-05-27 MED ORDER — ACETAMINOPHEN 650 MG RE SUPP
650.0000 mg | Freq: Once | RECTAL | Status: AC
  Administered 2015-05-27: 650 mg via RECTAL

## 2015-05-27 MED ORDER — SODIUM CHLORIDE 0.9 % IV BOLUS (SEPSIS)
1000.0000 mL | Freq: Once | INTRAVENOUS | Status: AC
  Administered 2015-05-27: 1000 mL via INTRAVENOUS

## 2015-05-27 MED ORDER — ROCURONIUM BROMIDE 50 MG/5ML IV SOLN
50.0000 mg | Freq: Once | INTRAVENOUS | Status: AC
  Administered 2015-05-27: 50 mg via INTRAVENOUS
  Filled 2015-05-27: qty 5

## 2015-05-27 MED ORDER — POTASSIUM CHLORIDE 20 MEQ/15ML (10%) PO SOLN
40.0000 meq | Freq: Once | ORAL | Status: AC
  Administered 2015-05-27: 40 meq
  Filled 2015-05-27: qty 30

## 2015-05-27 MED ORDER — LEVOFLOXACIN IN D5W 750 MG/150ML IV SOLN
750.0000 mg | Freq: Once | INTRAVENOUS | Status: AC
  Administered 2015-05-27: 750 mg via INTRAVENOUS
  Filled 2015-05-27: qty 150

## 2015-05-27 MED ORDER — INSULIN ASPART 100 UNIT/ML ~~LOC~~ SOLN
0.0000 [IU] | SUBCUTANEOUS | Status: DC
  Administered 2015-05-27 – 2015-05-31 (×9): 2 [IU] via SUBCUTANEOUS
  Administered 2015-06-01: 3 [IU] via SUBCUTANEOUS
  Administered 2015-06-01 – 2015-06-04 (×5): 2 [IU] via SUBCUTANEOUS
  Filled 2015-05-27 (×14): qty 2

## 2015-05-27 MED ORDER — ESCITALOPRAM OXALATE 10 MG PO TABS: 10.0000 mg | ORAL_TABLET | Freq: Every day | ORAL | Status: DC

## 2015-05-27 MED ORDER — INSULIN ASPART 100 UNIT/ML ~~LOC~~ SOLN: 0.0000 [IU] | Freq: Three times a day (TID) | SUBCUTANEOUS | Status: DC

## 2015-05-27 MED ORDER — SODIUM CHLORIDE 0.9 % IV SOLN
INTRAVENOUS | Status: DC
  Administered 2015-05-27: 14:00:00 via INTRAVENOUS

## 2015-05-27 MED ORDER — ROPINIROLE HCL 0.25 MG PO TABS
0.7500 mg | ORAL_TABLET | Freq: Every evening | ORAL | Status: DC
  Filled 2015-05-27: qty 3

## 2015-05-27 MED ORDER — IPRATROPIUM-ALBUTEROL 0.5-2.5 (3) MG/3ML IN SOLN
3.0000 mL | Freq: Four times a day (QID) | RESPIRATORY_TRACT | Status: DC
  Administered 2015-05-27 – 2015-06-01 (×19): 3 mL via RESPIRATORY_TRACT
  Filled 2015-05-27 (×19): qty 3

## 2015-05-27 MED ORDER — FENTANYL CITRATE (PF) 100 MCG/2ML IJ SOLN: 50.0000 ug | INTRAMUSCULAR | Status: DC | PRN

## 2015-05-27 MED ORDER — FENTANYL CITRATE (PF) 100 MCG/2ML IJ SOLN
50.0000 ug | INTRAMUSCULAR | Status: AC | PRN
  Administered 2015-05-27 (×3): 50 ug via INTRAVENOUS
  Filled 2015-05-27 (×3): qty 2

## 2015-05-27 MED ORDER — LORAZEPAM 2 MG/ML IJ SOLN
0.5000 mg | Freq: Once | INTRAMUSCULAR | Status: AC
  Administered 2015-05-27: 0.5 mg via INTRAVENOUS

## 2015-05-27 MED ORDER — ALBUTEROL SULFATE (2.5 MG/3ML) 0.083% IN NEBU: 2.5000 mg | INHALATION_SOLUTION | RESPIRATORY_TRACT | Status: DC | PRN

## 2015-05-27 MED ORDER — MAGNESIUM SULFATE 2 GM/50ML IV SOLN
2.0000 g | Freq: Once | INTRAVENOUS | Status: AC
  Administered 2015-05-27: 2 g via INTRAVENOUS
  Filled 2015-05-27 (×2): qty 50

## 2015-05-27 MED ORDER — ROPINIROLE HCL 0.5 MG PO TABS
0.7500 mg | ORAL_TABLET | Freq: Every evening | ORAL | Status: DC
Start: 2015-05-27 — End: 2015-06-05
  Administered 2015-05-27 – 2015-06-04 (×7): 0.75 mg
  Filled 2015-05-27 (×3): qty 3
  Filled 2015-05-27 (×2): qty 1
  Filled 2015-05-27 (×2): qty 3
  Filled 2015-05-27: qty 1
  Filled 2015-05-27 (×2): qty 3

## 2015-05-27 MED ORDER — ACETAMINOPHEN 650 MG RE SUPP
RECTAL | Status: AC
  Administered 2015-05-27: 650 mg via RECTAL
  Filled 2015-05-27: qty 1

## 2015-05-27 MED ORDER — OLANZAPINE 2.5 MG PO TABS
2.5000 mg | ORAL_TABLET | Freq: Two times a day (BID) | ORAL | Status: DC
  Filled 2015-05-27 (×2): qty 1

## 2015-05-27 MED ORDER — DEXTROSE 5 % IV SOLN
1.0000 g | Freq: Once | INTRAVENOUS | Status: AC
  Administered 2015-05-27: 1 g via INTRAVENOUS
  Filled 2015-05-27: qty 10

## 2015-05-27 MED ORDER — CLOPIDOGREL BISULFATE 75 MG PO TABS
75.0000 mg | ORAL_TABLET | Freq: Every day | ORAL | Status: DC
  Administered 2015-05-28 – 2015-06-05 (×9): 75 mg
  Filled 2015-05-27 (×9): qty 1

## 2015-05-27 MED ORDER — ENOXAPARIN SODIUM 40 MG/0.4ML ~~LOC~~ SOLN
40.0000 mg | SUBCUTANEOUS | Status: DC
  Administered 2015-05-27 – 2015-06-01 (×6): 40 mg via SUBCUTANEOUS
  Filled 2015-05-27 (×6): qty 0.4

## 2015-05-27 MED ORDER — MIDAZOLAM HCL 2 MG/2ML IJ SOLN
1.0000 mg | INTRAMUSCULAR | Status: DC | PRN
  Filled 2015-05-27 (×2): qty 2

## 2015-05-27 MED ORDER — PRAVASTATIN SODIUM 10 MG PO TABS
10.0000 mg | ORAL_TABLET | Freq: Every day | ORAL | Status: DC
  Filled 2015-05-27: qty 1

## 2015-05-27 MED ORDER — ESCITALOPRAM OXALATE 10 MG PO TABS
10.0000 mg | ORAL_TABLET | Freq: Every day | ORAL | Status: DC
  Administered 2015-05-28 – 2015-05-30 (×3): 10 mg
  Filled 2015-05-27 (×3): qty 1

## 2015-05-27 MED ORDER — ANTISEPTIC ORAL RINSE SOLUTION (CORINZ)
7.0000 mL | Freq: Four times a day (QID) | OROMUCOSAL | Status: DC
  Administered 2015-05-28 – 2015-06-02 (×24): 7 mL via OROMUCOSAL
  Filled 2015-05-27 (×30): qty 7

## 2015-05-27 MED ORDER — IPRATROPIUM-ALBUTEROL 0.5-2.5 (3) MG/3ML IN SOLN
RESPIRATORY_TRACT | Status: AC
  Administered 2015-05-27: 3 mL via RESPIRATORY_TRACT
  Filled 2015-05-27: qty 6

## 2015-05-27 MED ORDER — CLOPIDOGREL BISULFATE 75 MG PO TABS: 75.0000 mg | ORAL_TABLET | Freq: Every day | ORAL | Status: DC

## 2015-05-27 MED ORDER — DIVALPROEX SODIUM ER 500 MG PO TB24
750.0000 mg | ORAL_TABLET | Freq: Every evening | ORAL | Status: DC
  Filled 2015-05-27: qty 1

## 2015-05-27 MED ORDER — AZITHROMYCIN 500 MG IV SOLR: 250.0000 mg | INTRAVENOUS | Status: DC

## 2015-05-27 MED ORDER — VITAMIN D (ERGOCALCIFEROL) 1.25 MG (50000 UNIT) PO CAPS: 50000.0000 [IU] | ORAL_CAPSULE | ORAL | Status: DC

## 2015-05-27 MED ORDER — ALPRAZOLAM 0.25 MG PO TABS
0.2500 mg | ORAL_TABLET | Freq: Four times a day (QID) | ORAL | Status: DC | PRN
  Administered 2015-06-03 – 2015-06-04 (×2): 0.25 mg via ORAL
  Filled 2015-05-27 (×3): qty 1

## 2015-05-27 MED ORDER — ACETAMINOPHEN 325 MG PO TABS: 650.0000 mg | ORAL_TABLET | ORAL | Status: DC | PRN

## 2015-05-27 MED ORDER — OLANZAPINE 2.5 MG PO TABS
2.5000 mg | ORAL_TABLET | Freq: Two times a day (BID) | ORAL | Status: DC
Start: 2015-05-27 — End: 2015-06-05
  Administered 2015-05-27 – 2015-06-05 (×18): 2.5 mg
  Filled 2015-05-27 (×19): qty 1

## 2015-05-27 MED ORDER — ACETAMINOPHEN 160 MG/5ML PO SOLN
650.0000 mg | ORAL | Status: DC | PRN
  Administered 2015-05-30: 650 mg
  Filled 2015-05-27 (×2): qty 20.3

## 2015-05-27 MED ORDER — FLUTICASONE PROPIONATE 50 MCG/ACT NA SUSP
2.0000 | Freq: Every day | NASAL | Status: DC
  Administered 2015-05-28 – 2015-06-05 (×9): 2 via NASAL
  Filled 2015-05-27 (×2): qty 16

## 2015-05-27 MED ORDER — MIDAZOLAM HCL 2 MG/2ML IJ SOLN
1.0000 mg | INTRAMUSCULAR | Status: AC | PRN
  Administered 2015-05-27 (×3): 1 mg via INTRAVENOUS
  Filled 2015-05-27 (×3): qty 2

## 2015-05-27 MED ORDER — SODIUM CHLORIDE 0.9 % IV SOLN
1.0000 g | Freq: Three times a day (TID) | INTRAVENOUS | Status: DC
  Administered 2015-05-27 – 2015-05-29 (×5): 1 g via INTRAVENOUS
  Filled 2015-05-27 (×7): qty 1

## 2015-05-27 MED ORDER — SODIUM CHLORIDE 0.9 % IV SOLN
2.0000 g | Freq: Once | INTRAVENOUS | Status: AC
  Administered 2015-05-27: 2 g via INTRAVENOUS
  Filled 2015-05-27: qty 20

## 2015-05-27 MED ORDER — SODIUM CHLORIDE 0.9 % IV SOLN
1.0000 g | Freq: Once | INTRAVENOUS | Status: AC
  Administered 2015-05-27: 1 g via INTRAVENOUS
  Filled 2015-05-27: qty 10

## 2015-05-27 MED ORDER — VANCOMYCIN HCL IN DEXTROSE 1-5 GM/200ML-% IV SOLN
1000.0000 mg | INTRAVENOUS | Status: DC
  Administered 2015-05-27: 1000 mg via INTRAVENOUS
  Filled 2015-05-27 (×2): qty 200

## 2015-05-27 MED ORDER — FAMOTIDINE IN NACL 20-0.9 MG/50ML-% IV SOLN
20.0000 mg | Freq: Two times a day (BID) | INTRAVENOUS | Status: DC
  Administered 2015-05-27 – 2015-06-05 (×19): 20 mg via INTRAVENOUS
  Filled 2015-05-27 (×21): qty 50

## 2015-05-27 MED ORDER — TRAZODONE HCL 50 MG PO TABS
150.0000 mg | ORAL_TABLET | Freq: Every day | ORAL | Status: DC
  Administered 2015-05-27 – 2015-06-04 (×9): 150 mg
  Filled 2015-05-27 (×7): qty 3
  Filled 2015-05-27 (×2): qty 1

## 2015-05-27 MED ORDER — PANTOPRAZOLE SODIUM 40 MG PO TBEC: 40.0000 mg | DELAYED_RELEASE_TABLET | Freq: Every day | ORAL | Status: DC

## 2015-05-27 MED ORDER — TRAZODONE HCL 50 MG PO TABS: 150.0000 mg | ORAL_TABLET | Freq: Every day | ORAL | Status: DC

## 2015-05-27 MED ORDER — POTASSIUM CHLORIDE IN NACL 20-0.9 MEQ/L-% IV SOLN
INTRAVENOUS | Status: DC
  Administered 2015-05-27 – 2015-05-28 (×2): via INTRAVENOUS
  Filled 2015-05-27 (×3): qty 1000

## 2015-05-27 MED ORDER — TRAMADOL HCL 50 MG PO TABS: 50.0000 mg | ORAL_TABLET | Freq: Four times a day (QID) | ORAL | Status: DC | PRN

## 2015-05-27 MED ORDER — MICONAZOLE NITRATE 2 % EX CREA
1.0000 "application " | TOPICAL_CREAM | Freq: Two times a day (BID) | CUTANEOUS | Status: DC
  Administered 2015-05-27 – 2015-06-05 (×19): 1 via TOPICAL
  Filled 2015-05-27 (×2): qty 14

## 2015-05-27 MED ORDER — ALBUTEROL SULFATE (2.5 MG/3ML) 0.083% IN NEBU
2.5000 mg | INHALATION_SOLUTION | RESPIRATORY_TRACT | Status: DC | PRN
  Administered 2015-05-29: 2.5 mg via RESPIRATORY_TRACT
  Filled 2015-05-27: qty 3

## 2015-05-27 MED ORDER — VANCOMYCIN HCL IN DEXTROSE 1-5 GM/200ML-% IV SOLN
1000.0000 mg | Freq: Once | INTRAVENOUS | Status: AC
  Administered 2015-05-27: 1000 mg via INTRAVENOUS
  Filled 2015-05-27: qty 200

## 2015-05-27 MED ORDER — SODIUM CHLORIDE 0.9 % IV SOLN
1.0000 g | Freq: Once | INTRAVENOUS | Status: AC
  Administered 2015-05-27: 1 g via INTRAVENOUS
  Filled 2015-05-27 (×2): qty 1

## 2015-05-27 MED ORDER — IOHEXOL 350 MG/ML SOLN
75.0000 mL | Freq: Once | INTRAVENOUS | Status: AC | PRN
  Administered 2015-05-27: 75 mL via INTRAVENOUS

## 2015-05-27 NOTE — ED Provider Notes (Signed)
[facility] Emergency Department Provider Note  ____________________________________________  Time seen: 6:40 AM  I have reviewed the triage vital signs and the nursing notes.  History Limited secondary to altered mental status HISTORY  Chief Complaint Altered Mental Status     HPI [name] Stuart is a 80 y.o. female presents via EMS with dyspnea and has been progressive for an unknown duration of time as well as cough and fever.     Past Medical History  Diagnosis Date  . Other specified rehabilitation procedure(V57.89)   . Unspecified constipation   . Esophageal reflux   . Chronic airway obstruction, not elsewhere classified   . Other specified disorder of skin   . Other symptoms involving skin and integumentary tissues   . Difficulty in walking(719.7)   . Muscle weakness (generalized)   . Dysphagia, oropharyngeal phase   . Cognitive communication deficit   . Anxiety state, unspecified   . Unspecified essential hypertension   . Unspecified late effects of cerebrovascular disease   . Depressive disorder, not elsewhere classified   . Other and unspecified hyperlipidemia   . Restless legs syndrome (RLS)   . Other chronic pain   . Insomnia, unspecified   . Pressure ulcer, buttock(707.05)   . Unspecified episodic mood disorder     There are no active problems to display for this patient.   Past Surgical History  Procedure Laterality Date  . Cholecystectomy    . Bony pelvis surgery      Current Outpatient Rx  Name  Route  Sig  Dispense  Refill  . acetaminophen (TYLENOL) 325 MG tablet   Oral   Take 650 mg by mouth every 4 (four) hours as needed.         . ALPRAZolam (XANAX) 0.25 MG tablet      0.25 mg. Take one tablet by mouth every six hours         . amLODipine (NORVASC) 2.5 MG tablet   Oral   Take 2.5 mg by mouth daily.         . clopidogrel (PLAVIX) 75 MG tablet   Oral   Take 75 mg by mouth daily with breakfast.          . divalproex (DEPAKOTE) 250 MG DR tablet   Oral   Take 250 mg by mouth 3 (three) times daily.         Marland Kitchen. escitalopram (LEXAPRO) 10 MG tablet   Oral   Take 10 mg by mouth daily.         . miconazole (MICOTIN) 2 % cream   Topical   Apply 1 application topically 2 (two) times daily.         Marland Kitchen. nystatin (MYCOSTATIN) powder   Topical   Apply topically 2 (two) times daily.         Marland Kitchen. OLANZapine (ZYPREXA) 2.5 MG tablet   Oral   Take 2.5 mg by mouth 2 (two) times daily.         Marland Kitchen. omeprazole (PRILOSEC) 20 MG capsule   Oral   Take 20 mg by mouth daily.         . polyethylene glycol (MIRALAX / GLYCOLAX) packet   Oral   Take 17 g by mouth daily.         . pravastatin (PRAVACHOL) 10 MG tablet   Oral   Take 10 mg by mouth daily.         Marland Kitchen. rOPINIRole (REQUIP) 0.5 MG tablet  Oral   Take 0.5 mg by mouth daily.         Marland Kitchen tiotropium (SPIRIVA) 18 MCG inhalation capsule   Inhalation   Place 18 mcg into inhaler and inhale daily.         . traZODone (DESYREL) 100 MG tablet   Oral   Take 100 mg by mouth at bedtime.           Allergies Aspirin; Codeine sulfate; Ibuprofen; and Penicillins  No family history on file.  Social History Social History  Substance Use Topics  . Smoking status: Never Smoker   . Smokeless tobacco: Not on file  . Alcohol Use: No    Review of Systems  Constitutional: Negative for fever. Eyes: Negative for visual changes. ENT: Negative for sore throat. Cardiovascular: Negative for chest pain. Respiratory: Positive for cough and dyspnea Gastrointestinal: Negative for abdominal pain, vomiting and diarrhea. Genitourinary: Negative for dysuria. Musculoskeletal: Negative for back pain. Skin: Negative for rash. Neurological: Negative for headaches, focal weakness or numbness.   10-point ROS otherwise negative.  ____________________________________________   PHYSICAL EXAM:  VITAL SIGNS: ED Triage Vitals  Enc Vitals Group      BP --      Pulse --      Resp --      Temp --      Temp src --      SpO2 --      Weight --      Height --      Head Cir --      [facility] --      Pain Score --      Pain Loc --      Pain Edu? --      Excl. in GC? --      Constitutional: Alert and oriented. Well appearing and in no distress. Eyes: Conjunctivae are normal. PERRL. Normal extraocular movements. ENT   Head: Normocephalic and atraumatic.   Nose: No congestion/rhinnorhea.   Mouth/Throat: Mucous membranes are moist.   Neck: No stridor. Hematological/Lymphatic/Immunilogical: No cervical lymphadenopathy. Cardiovascular: Normal rate, regular rhythm. Normal and symmetric distal pulses are present in all extremities. No murmurs, rubs, or gallops. Respiratory: Tachypnea and accessory muscle use diffuse rhonchi  Gastrointestinal: Soft and nontender. No distention. There is no CVA tenderness. Genitourinary: deferred Musculoskeletal: Nontender with normal range of motion in all extremities. No joint effusions.  No lower extremity tenderness nor edema. Neurologic:  Normal speech and language. No gross focal neurologic deficits are appreciated. Speech is normal.  Skin:  Skin is warm, dry and intact. No rash noted. Psychiatric: Mood and affect are normal. Speech and behavior are normal. Patient exhibits appropriate insight and judgment.  ____________________________________________    LABS (pertinent positives/negatives)  Labs Reviewed  CBC - Abnormal; Notable for the following:    WBC 18.0 (*)    RDW 14.6 (*)    All other components within normal limits  URINALYSIS COMPLETEWITH MICROSCOPIC ([facility] ONLY) - Abnormal; Notable for the following:    Color, Urine YELLOW (*)    APPearance CLEAR (*)    Ketones, ur TRACE (*)    Squamous Epithelial / LPF 6-30 (*)    All other components within normal limits  LACTIC ACID, PLASMA - Abnormal; Notable for the following:    Lactic Acid, Venous 4.4 (*)    All other  components within normal limits  LACTIC ACID, PLASMA - Abnormal; Notable for the following:    Lactic Acid, Venous 2.3 (*)  All other components within normal limits  COMPREHENSIVE METABOLIC PANEL - Abnormal; Notable for the following:    Sodium 126 (*)    Potassium 3.2 (*)    Chloride 98 (*)    CO2 20 (*)    Glucose, Bld 577 (*)    Calcium 5.8 (*)    Total Protein 4.7 (*)    Albumin 2.1 (*)    ALT 6 (*)    Total Bilirubin 2.0 (*)    All other components within normal limits  MAGNESIUM - Abnormal; Notable for the following:    Magnesium 1.2 (*)    All other components within normal limits  PHOSPHORUS - Abnormal; Notable for the following:    Phosphorus 2.4 (*)    All other components within normal limits  CBC - Abnormal; Notable for the following:    WBC 14.6 (*)    Hemoglobin 11.6 (*)    All other components within normal limits  COMPREHENSIVE METABOLIC PANEL - Abnormal; Notable for the following:    Sodium 133 (*)    Glucose, Bld 136 (*)    Calcium 8.3 (*)    Total Protein 5.7 (*)    Albumin 2.7 (*)    AST 13 (*)    ALT 8 (*)    Anion gap 3 (*)    All other components within normal limits  GLUCOSE, CAPILLARY - Abnormal; Notable for the following:    Glucose-Capillary 131 (*)    All other components within normal limits  GLUCOSE, CAPILLARY - Abnormal; Notable for the following:    Glucose-Capillary 122 (*)    All other components within normal limits  GLUCOSE, CAPILLARY - Abnormal; Notable for the following:    Glucose-Capillary 129 (*)    All other components within normal limits  GLUCOSE, CAPILLARY - Abnormal; Notable for the following:    Glucose-Capillary 130 (*)    All other components within normal limits  GLUCOSE, CAPILLARY - Abnormal; Notable for the following:    Glucose-Capillary 127 (*)    All other components within normal limits  GLUCOSE, CAPILLARY - Abnormal; Notable for the following:    Glucose-Capillary 100 (*)    All other components within  normal limits  GLUCOSE, CAPILLARY - Abnormal; Notable for the following:    Glucose-Capillary 101 (*)    All other components within normal limits  CULTURE, BLOOD (ROUTINE X 2)  CULTURE, BLOOD (ROUTINE X 2)  MRSA PCR SCREENING  URINE CULTURE  BLOOD GAS, VENOUS  PARATHYROID HORMONE, INTACT (NO CA)  GLUCOSE, CAPILLARY  MAGNESIUM  GLUCOSE, CAPILLARY  GLUCOSE, CAPILLARY  GLUCOSE, CAPILLARY  CBC  BASIC METABOLIC PANEL     ____________________________________________   EKG  ED ECG REPORT I, Rayn Shorb, [place] N, the attending physician, personally viewed and interpreted this ECG.   Date: 05/29/2015  EKG Time: 6:45 AM  Rate: 116  Rhythm: Sinus tachycardia  Axis: None  Intervals: Normal  ST&T Change: None   ____________________________________________    RADIOLOGY   CT Chest W Contrast (Final result) Result time: 05/27/15 12:19:00   Final result by Rad Results In Interface (05/27/15 12:19:00)   Narrative:   CLINICAL DATA: Altered mental status and shortness of breath. Recent pneumonia.  EXAM: CT CHEST WITH CONTRAST  TECHNIQUE: Multidetector CT imaging of the chest was performed during intravenous contrast administration.  CONTRAST: 75mL OMNIPAQUE IOHEXOL 350 MG/ML SOLN  COMPARISON: Chest radiograph 05/27/2015.  FINDINGS: Mediastinum/Nodes: Mural thrombus is seen within the right common carotid artery, with associated luminal narrowing (series 2,  image 1). Numerous mediastinal lymph nodes, none of which are pathologically enlarged. No hilar or axillary adenopathy. Atherosclerotic calcification of the arterial vasculature, including coronary arteries. Heart size normal. No pericardial effusion.  Lungs/Pleura: Image quality is degraded by respiratory motion. Centrilobular emphysema. Scattered peribronchovascular nodularity bilaterally with areas of consolidation in the right lower lobe. Mild volume loss in the left lower lobe. No pleural fluid. Debris  is seen within the right lower lobe bronchus (series 3, image 27), as well as within right lower lobe bronchi. Airway is suboptimally evaluated due to motion and expiratory phase imaging.  Upper abdomen: Visualized portions of the liver, adrenal glands, left kidney, spleen, pancreas and stomach are grossly unremarkable. No upper abdominal adenopathy.  Musculoskeletal: No worrisome lytic or sclerotic lesions. Degenerative changes are seen in the spine. There may be very mild compression of the inferior endplate of T8.  IMPRESSION: 1. Debris in the right lower lobe bronchi with consolidation in the right lower lobe. Findings are indicative of pneumonia. Followup CT chest without contrast is recommended in 3-4 weeks following trial of antibiotic therapy to ensure resolution and exclude underlying malignancy. 2. Scattered peribronchovascular nodularity, also likely infectious. 3. Narrowing of the proximal right common carotid artery. 4. Coronary artery calcification.   Electronically Signed By: Leanna Battles M.D. On: 05/27/2015 12:19          DG Chest Portable 1 View (Final result) Result time: 05/27/15 07:31:13   Final result by Rad Results In Interface (05/27/15 07:31:13)   Narrative:   CLINICAL DATA: Severe shortness of breath beginning yesterday. Fever and cough. COPD.  EXAM: PORTABLE CHEST 1 VIEW  COMPARISON: 05/03/2015  FINDINGS: Heart size remains stable. Ectasia of the thoracic aorta is unchanged. No evidence of acute infiltrate, pleural effusion, or pneumothorax.  New irregular nodular opacity is seen in the right upper lobe. This was not seen on previous study and small bronchogenic carcinoma cannot be excluded.  IMPRESSION: Right upper lobe nodular opacity. Chest CT with contrast recommended to exclude small bronchogenic carcinoma.   Electronically Signed By: Myles Rosenthal M.D. On: 05/27/2015 07:31      Critical Care performed:  CRITICAL CARE Performed by: Bayard Males N   Total critical care time: 30 minutes  Critical care time was exclusive of separately billable procedures and treating other patients.  Critical care was necessary to treat or prevent imminent or life-threatening deterioration.  Critical care was time spent personally by me on the following activities: development of treatment plan with patient and/or surrogate as well as nursing, discussions with consultants, evaluation of patient's response to treatment, examination of patient, obtaining history from patient or surrogate, ordering and performing treatments and interventions, ordering and review of laboratory studies, ordering and review of radiographic studies, pulse oximetry and re-evaluation of patient's condition.    ____________________________________________   INITIAL IMPRESSION / ASSESSMENT AND PLAN / ED COURSE  Pertinent labs & imaging results that were available during my care of the patient were reviewed by me and considered in my medical decision making (see chart for details).    ____________________________________________   FINAL CLINICAL IMPRESSION(S) / ED DIAGNOSES  Final diagnoses:  Aspiration pneumonia of right upper lobe, unspecified aspiration pneumonia type (HCC)  Severe sepsis with acute organ dysfunction (HCC)  Hyponatremia  Hypocalcemia  Lactic acidosis      Darci Current, MD 05/29/15 [phone]

## 2015-05-27 NOTE — ED Notes (Signed)
Pt presents to ED via EMS from for altered mental status and SOB, onset tonight around 100. EMS reports recent pneumonia. [facility] pt.

## 2015-05-27 NOTE — Progress Notes (Signed)
ANTIBIOTIC CONSULT NOTE - INITIAL  Pharmacy Consult for Vancomycin and Meropenem Indication: HCAP  Allergies  Allergen Reactions  . Aspirin   . Codeine Sulfate   . Ibuprofen   . Penicillins     Patient Measurements: Height:  (167.6 cm) Weight: 150 lb (68.04 kg) IBW/kg (Calculated) : 59.3 Adjusted Body Weight: 62.8 kg  Vital Signs: Temp: 100 F (37.8 C) (11/23 1115) Temp Source: Oral (11/23 0647) BP: 107/64 mmHg (11/23 1115) Pulse Rate: 104 (11/23 1115) Intake/Output from previous day:   Intake/Output from this shift: Total I/O In: -  Out: 50 [Urine:50]  Labs:  Recent Labs  05/27/15 0651 05/27/15 0807  WBC 18.0*  --   HGB 14.2  --   PLT 325  --   CREATININE  --  0.62   Estimated Creatinine Clearance: 54.3 mL/min (by C-G formula based on Cr of 0.62). No results for input(s): VANCOTROUGH, VANCOPEAK, VANCORANDOM, GENTTROUGH, GENTPEAK, GENTRANDOM, TOBRATROUGH, TOBRAPEAK, TOBRARND, AMIKACINPEAK, AMIKACINTROU, AMIKACIN in the last 72 hours.   Microbiology: Recent Results (from the past 720 hour(s))  Blood culture (routine x 2)     Status: None (Preliminary result)   Collection Time: 05/27/15  6:51 AM  Result Value Ref Range Status   Specimen Description BLOOD  Final   Special Requests NONE  Final   Culture NO GROWTH <12 HOURS  Final   Report Status PENDING  Incomplete  Blood culture (routine x 2)     Status: None (Preliminary result)   Collection Time: 05/27/15  6:52 AM  Result Value Ref Range Status   Specimen Description BLOOD RIGHT HAND  Final   Special Requests BOTTLES DRAWN AEROBIC AND ANAEROBIC  2CC  Final   Culture NO GROWTH <12 HOURS  Final   Report Status PENDING  Incomplete    Medical History: Past Medical History  Diagnosis Date  . Other specified rehabilitation procedure(V57.89)   . Unspecified constipation   . Esophageal reflux   . Chronic airway obstruction, not elsewhere classified   . Other specified disorder of skin   . Other  symptoms involving skin and integumentary tissues   . Difficulty in walking(719.7)   . Muscle weakness (generalized)   . Dysphagia, oropharyngeal phase   . Cognitive communication deficit   . Anxiety state, unspecified   . Unspecified essential hypertension   . Unspecified late effects of cerebrovascular disease   . Depressive disorder, not elsewhere classified   . Other and unspecified hyperlipidemia   . Restless legs syndrome (RLS)   . Other chronic pain   . Insomnia, unspecified   . Pressure ulcer, buttock(707.05)   . Unspecified episodic mood disorder   . Stroke [facility])     Medications:  Anti-infectives    Start     Dose/Rate Route Frequency Ordered Stop   05/28/15 1000  azithromycin (ZITHROMAX) 250 mg in dextrose 5 % 125 mL IVPB     250 mg 125 mL/hr over 60 Minutes Intravenous Every 24 hours 05/27/15 1108     05/27/15 2200  meropenem (MERREM) 1 g in sodium chloride 0.9 % 100 mL IVPB     1 g 200 mL/hr over 30 Minutes Intravenous 3 times per day 05/27/15 1225     05/27/15 2100  vancomycin (VANCOCIN) IVPB 1000 mg/200 mL premix     1,000 mg 200 mL/hr over 60 Minutes Intravenous Every 18 hours 05/27/15 1238     05/27/15 1145  meropenem (MERREM) 1 g in sodium chloride 0.9 % 100 mL IVPB  1 g 200 mL/hr over 30 Minutes Intravenous  Once 05/27/15 1138     05/27/15 0930  levofloxacin (LEVAQUIN) IVPB 750 mg     750 mg 100 mL/hr over 90 Minutes Intravenous  Once 05/27/15 0917 05/27/15 1121   05/27/15 0730  vancomycin (VANCOCIN) IVPB 1000 mg/200 mL premix     1,000 mg 200 mL/hr over 60 Minutes Intravenous  Once 05/27/15 0721 05/27/15 0841   05/27/15 0730  cefTRIAXone (ROCEPHIN) 1 g in dextrose 5 % 50 mL IVPB     1 g 100 mL/hr over 30 Minutes Intravenous  Once 05/27/15 [id number] 05/27/15 [id number]     Assessment: Patient is a 80 yo female admitted for possible HCAP. Has received Ceftriaxone 1g IV, Vancomycin 1g IV, and Levaquin 750mg  IV in the ED. To be continued on Meropenem, Vancomycin,  and Azithromycin (for atypical coverage).  Vancomycin Pharmacokinetic parameters: Ke=0.049 T1/2=14hr Vd=47.6 L  Goal of Therapy:  Vancomycin trough level 15-20 mcg/ml  Plan:  Measure antibiotic drug levels at steady state Follow up culture results Will order Vancomycin 1g IV q18h to start ~12hr after initial dose for stacked dosing. Will check a trough level prior to 5th dose.   Will order Meropenem 1g IV q8h. Will continue Azithromycin 500mg  IV q24h but hold off starting until 11/24 since patient received Levaquin this morning and there needs to be 24 hour between the administration of these two meds to prevent arrhythmias. Levaquin will provide atypical coverage until Azithromycin is started.  Foye DeerLisa G Jocelyne Reinertsen 05/27/2015,12:39 PM

## 2015-05-27 NOTE — Care Management Note (Signed)
Case Management Note  Patient Details  Name: [name] Stuart MRN: [id number] Date of Birth: 10-08-1934  Subjective/Objective:    Patient a resident of [facility]. CSW consult initiated                Action/Plan:   Expected Discharge Date:                  Expected Discharge Plan:  Skilled Nursing Facility  In-House Referral:  Clinical Social Work  Discharge planning Services     Post Acute Care Choice:    Choice offered to:     DME Arranged:    DME Agency:     HH Arranged:    HH Agency:     Status of Service:  In process, will continue to follow  Medicare Important Message Given:    Date Medicare IM Given:    Medicare IM give by:    Date Additional Medicare IM Given:    Additional Medicare Important Message give by:     If discussed at Long Length of Stay Meetings, dates discussed:    Additional Comments:  [name] MemosLisa M Gabby Rackers, RN 05/27/2015, 1:55 PM

## 2015-05-27 NOTE — H&P (Signed)
[facility] - [place] at [facility]   PATIENT NAME: [name] Stuart    MR#:  [id number]  DATE OF BIRTH:  1934/10/03  DATE OF ADMISSION:  05/27/2015  PRIMARY CARE PHYSICIAN: [facility] [place]   REQUESTING/REFERRING PHYSICIAN: Scotty Court, [place]  CHIEF COMPLAINT:   Chief Complaint  Patient presents with  . Altered Mental Status    HISTORY OF PRESENT ILLNESS: [name] Stuart  is a 80 y.o. female with a known history of cerebrovascular accident, with paralysis living in nursing home for last 2 years, using oxygen as needed basis and not able to walk but still able to feed herself with her arms, also history of the hypercholesterolemia, anxiety and bipolar disorder, esophageal reflux-was taken by husband yesterday to home for the holidays when she was at her baseline condition. She also uses albuterol nebulizers so husband gave her nebulizer treatment 2-3 times overnight but around 2 or 3 in the nighttime he noted that the patient is getting more and more short of breath in spite of using albuterol treatment and she felt very warm so he brought her to emergency room in the morning., In ER patient is noted to have right upper lobe pneumonia with questionable nodularity, hypoxic required BiPAP  , high lactic acid, low calcium level, slight hypotension and tachycardia - and as per husband she is also more confused than her baseline - so she is given for admission for sepsis and pneumonia.   history obtained from patient's husband.  PAST MEDICAL HISTORY:   Past Medical History  Diagnosis Date  . Other specified rehabilitation procedure(V57.89)   . Unspecified constipation   . Esophageal reflux   . Chronic airway obstruction, not elsewhere classified   . Other specified disorder of skin   . Other symptoms involving skin and integumentary tissues   . Difficulty in walking(719.7)   . Muscle weakness (generalized)   . Dysphagia, oropharyngeal phase   . Cognitive  communication deficit   . Anxiety state, unspecified   . Unspecified essential hypertension   . Unspecified late effects of cerebrovascular disease   . Depressive disorder, not elsewhere classified   . Other and unspecified hyperlipidemia   . Restless legs syndrome (RLS)   . Other chronic pain   . Insomnia, unspecified   . Pressure ulcer, buttock(707.05)   . Unspecified episodic mood disorder   . Stroke [facility])     PAST SURGICAL HISTORY:  Past Surgical History  Procedure Laterality Date  . Cholecystectomy    . Bony pelvis surgery      SOCIAL HISTORY:  Social History  Substance Use Topics  . Smoking status: Current Some Day Smoker -- 0.25 packs/day  . Smokeless tobacco: Not on file  . Alcohol Use: No    FAMILY HISTORY:  Family History  Problem Relation Age of Onset  . CAD Mother   . Brain cancer Father     DRUG ALLERGIES:  Allergies  Allergen Reactions  . Aspirin   . Codeine Sulfate   . Ibuprofen   . Penicillins     REVIEW OF SYSTEMS:   Patient is on BiPAP and has some confusion so not able to give me any history   MEDICATIONS AT HOME:  Prior to Admission medications   Medication Sig Start Date End Date Taking? Authorizing Provider  acetaminophen (TYLENOL) 325 MG tablet Take 650 mg by mouth every 4 (four) hours as needed for mild pain, moderate pain or fever.    Yes Historical Provider, MD  albuterol (  PROVENTIL) (2.5 MG/3ML) 0.083% nebulizer solution Take 2.5 mg by nebulization every 3 (three) hours as needed for wheezing or shortness of breath.   Yes Historical Provider, MD  ALPRAZolam (XANAX) 0.25 MG tablet Take 0.25 mg by mouth every 6 (six) hours as needed for anxiety.    Yes Historical Provider, MD  amLODipine (NORVASC) 2.5 MG tablet Take 2.5 mg by mouth daily.   Yes Historical Provider, MD  clopidogrel (PLAVIX) 75 MG tablet Take 75 mg by mouth daily.    Yes Historical Provider, MD  divalproex (DEPAKOTE ER) 250 MG 24 hr tablet Take 750 mg by mouth every  evening.   Yes Historical Provider, MD  escitalopram (LEXAPRO) 10 MG tablet Take 10 mg by mouth daily.   Yes Historical Provider, MD  fluticasone (FLONASE) 50 MCG/ACT nasal spray Place 2 sprays into both nostrils daily.   Yes Historical Provider, MD  miconazole (MICOTIN) 2 % cream Apply 1 application topically 2 (two) times daily. To buttocks and sacrum   Yes Historical Provider, MD  nystatin (MYCOSTATIN/NYSTOP) 100000 UNIT/GM POWD Apply topically 2 (two) times daily. To groin   Yes Historical Provider, MD  OLANZapine (ZYPREXA) 2.5 MG tablet Take 2.5 mg by mouth 2 (two) times daily.   Yes Historical Provider, MD  omeprazole (PRILOSEC) 20 MG capsule Take 20 mg by mouth every morning.    Yes Historical Provider, MD  polyethylene glycol (MIRALAX / GLYCOLAX) packet Take 17 g by mouth daily as needed for mild constipation, moderate constipation or severe constipation.    Yes Historical Provider, MD  pravastatin (PRAVACHOL) 10 MG tablet Take 10 mg by mouth at bedtime.    Yes Historical Provider, MD  rOPINIRole (REQUIP) 0.25 MG tablet Take 0.75 mg by mouth every evening.   Yes Historical Provider, MD  traMADol (ULTRAM) 50 MG tablet Take 50 mg by mouth every 6 (six) hours as needed for moderate pain or severe pain.   Yes Historical Provider, MD  traZODone (DESYREL) 150 MG tablet Take 150 mg by mouth at bedtime.   Yes Historical Provider, MD  Vitamin D, Ergocalciferol, (DRISDOL) 50000 UNITS CAPS capsule Take 50,000 Units by mouth every 30 (thirty) days. On the 22nd of each month   Yes Historical Provider, MD      PHYSICAL EXAMINATION:   VITAL SIGNS: Blood pressure 103/61, pulse 109, temperature 100.5 F (38.1 C), temperature source Oral, resp. rate 24, height  (1.676 m), weight 68.04 kg (150 lb), SpO2 93 %.  GENERAL:  80 y.o.-year-old patient lying in the bed with acute respi distress.  EYES: Pupils equal, round, reactive to light and accommodation. No scleral icterus. Extraocular muscles intact.   HEENT: Head atraumatic, normocephalic. Oropharynx and nasopharynx clear.  NECK:  Supple, no jugular venous distention. No thyroid enlargement, no tenderness.  LUNGS: Normal breath sounds bilaterally, positive wheezing, mild  crepitation. No use of accessory muscles of respiration.  requiring BiPAP  CARDIOVASCULAR: S1, S2 normal. No murmurs, rubs, or gallops.  ABDOMEN: Soft, nontender, nondistended. Bowel sounds present. No organomegaly or mass.  EXTREMITIES: No pedal edema, cyanosis, or clubbing. chronic atrophic changes and deformities present on both lower limbs .  NEUROLOGIC: Patient is alert but confused and on BiPAP, she is not moving her lower extremities at all to stimuli, but upper extremities she is able to move her fine movements of her fingers and hands are very weak.  PSYCHIATRIC: The patient is alert and confused .  SKIN: No obvious rash, lesion, or ulcer.   LABORATORY  PANEL:   CBC  Recent Labs Lab 05/27/15 0651  WBC 18.0*  HGB 14.2  HCT 43.0  PLT 325  MCV 90.7  MCH 29.9  MCHC 33.0  RDW 14.6*   ------------------------------------------------------------------------------------------------------------------  Chemistries   Recent Labs Lab 05/27/15 0807  NA 126*  K 3.2*  CL 98*  CO2 20*  GLUCOSE 577*  BUN 8  CREATININE 0.62  CALCIUM 5.8*  MG 1.2*  AST 17  ALT 6*  ALKPHOS 102  BILITOT 2.0*   ------------------------------------------------------------------------------------------------------------------ estimated creatinine clearance is 54.3 mL/min (by C-G formula based on Cr of 0.62). ------------------------------------------------------------------------------------------------------------------ No results for input(s): TSH, T4TOTAL, T3FREE, THYROIDAB in the last 72 hours.  Invalid input(s): FREET3   Coagulation profile No results for input(s): INR, PROTIME in the last 168  hours. ------------------------------------------------------------------------------------------------------------------- No results for input(s): DDIMER in the last 72 hours. -------------------------------------------------------------------------------------------------------------------  Cardiac Enzymes No results for input(s): CKMB, TROPONINI, MYOGLOBIN in the last 168 hours.  Invalid input(s): CK ------------------------------------------------------------------------------------------------------------------ Invalid input(s): POCBNP  ---------------------------------------------------------------------------------------------------------------  Urinalysis    Component Value Date/Time   COLORURINE YELLOW* 05/27/2015 0651   COLORURINE Yellow 09/11/2013 1530   APPEARANCEUR CLEAR* 05/27/2015 0651   APPEARANCEUR Clear 09/11/2013 1530   LABSPEC 1.020 05/27/2015 0651   LABSPEC 1.012 09/11/2013 1530   PHURINE 5.0 05/27/2015 0651   PHURINE 6.0 09/11/2013 1530   GLUCOSEU NEGATIVE 05/27/2015 0651   GLUCOSEU Negative 09/11/2013 1530   HGBUR NEGATIVE 05/27/2015 0651   HGBUR Negative 09/11/2013 1530   BILIRUBINUR NEGATIVE 05/27/2015 0651   BILIRUBINUR Negative 09/11/2013 1530   KETONESUR TRACE* 05/27/2015 0651   KETONESUR Negative 09/11/2013 1530   PROTEINUR NEGATIVE 05/27/2015 0651   PROTEINUR Negative 09/11/2013 1530   NITRITE NEGATIVE 05/27/2015 0651   NITRITE Negative 09/11/2013 1530   LEUKOCYTESUR NEGATIVE 05/27/2015 0651   LEUKOCYTESUR Negative 09/11/2013 1530     RADIOLOGY: Dg Chest Portable 1 View  05/27/2015  CLINICAL DATA:  Severe shortness of breath beginning yesterday. Fever and cough. COPD. EXAM: PORTABLE CHEST 1 VIEW COMPARISON:  05/03/2015 FINDINGS: Heart size remains stable. Ectasia of the thoracic aorta is unchanged. No evidence of acute infiltrate, pleural effusion, or pneumothorax. New irregular nodular opacity is seen in the right upper lobe. This was not  seen on previous study and small bronchogenic carcinoma cannot be excluded. IMPRESSION: Right upper lobe nodular opacity. Chest CT with contrast recommended to exclude small bronchogenic carcinoma. Electronically Signed   By: Myles Rosenthal M.D.   On: 05/27/2015 07:31    EKG: Orders placed or performed during the hospital encounter of 05/27/15  . EKG 12-Lead  . EKG 12-Lead    IMPRESSION AND PLAN:  * Sepsis secondary to healthcare associated pneumonia  * Acute hypoxic respiratory failure  * Lactic acidosis     admit to stepdown unit, give IV fluids, currently on BiPAP and will continue that and call pulmonary consult for help.    As patient is from nursing home will give her vancomycin, meropenem, azithromycin.    Blood cultures are sent from ER.  * Possible nodularity on the right upper lobe on chest x-ray  CT scan of chest with contrast for further diagnosis.  * Hyperglycemia   As patient will not be able to eat much with BiPAP and confusion and sepsis, I will just give her sliding scale coverage for now she is on IV fluids and will help to manage according to her blood sugar levels running in the future.  * Hypocalcemia   Calcium level is low disproportionately  to her low albumin level.   Magnesium is low and phosphate level is also low.   I will replace magnesium IV and calcium IV, check PTH level.  * Hyperlipidemia  Continue statin.  * COPD  Continue nebulizer treatment.  * Anxiety and bipolar disorder   Continue her baseline psych meds as she was taking at home.    All the records are reviewed and case discussed with ED provider. Management plans discussed with the patient, family and they are in agreement.  CODE STATUS: full code   I discussed about her critical condition or possible And Asked about Further Dissections and Extent of Treatment from Husband, and He Want to Have All the Possible Treatments to Offer Her.   TOTAL TIME TAKING CARE OF THIS PATIENT:50 critical  care minutes.    Altamese DillingVACHHANI, Kel Senn M.D on 05/27/2015   Between 7am to 6pm - Pager - [phone]  After 6pm go to [email or web address] - password [email or web address]  Fabio Neighborsagle [place] [facility]  Office  [phone]  CC: Primary care physician; [facility] [place]   Note: This dictation was prepared with Dragon dictation along with smaller phrase technology. Any transcriptional errors that result from this process are unintentional.

## 2015-05-27 NOTE — Procedures (Signed)
BRONCHOSCOPY   Indication:   Suspected pill aspiration into R main bronchus   Sedation:  Pt already sedated on vent   Procedure/Findings: After adequate sedation and anesthesia, the standard bronchoscope was introduced via the ETT and an was performed. In the major airways, there were copious white, mucoid secretions that were successfully suctioned and removed. After adequate visualization was achieved, it was evident that there was a foreign body obstructing the R mainstem bronchus. This had the appearance of  vegetable matter. With much effort and multiple attempts, this was grasped with the biopsy forceps and pulled out through the ETT. It proved to be an intact pea. After removal, it was evident that there were multiple other peas lodged in the bronchial tree on the right. Again, with multiple attempts 3 more intact peas were successfully removed but it was evident that more remained in the airways and these could not be successfully removed. Ultimately the therapeutic bronchoscope was obtained and again introduced through the ETT. Using a snare device, 4 more peas were removed (total of 8) and a repeat airway exam was performed revealing copious white mucoid secretions in both lungs, R>L. These were successfully suctioned and removed. Again, a thorough airway exam of all bronchopulmonary segments were examined. There did not appear to be any remaining peas or other food matter and the procedure was terminated. The patient tolerated the procedure well with transient hypoxemia during the first half of the procedure which resolved each time the scope was removed.   Billy Fischeravid Simonds, MD;  PCCM service; Mobile [phone]

## 2015-05-27 NOTE — ED Provider Notes (Addendum)
-----------------------------------------   9:14 AM on 05/27/2015 -----------------------------------------  Assumed care from Dr. Manson PasseyBrown at 7:00 AM with labs pending. Briefly, patient presents with altered mental status and shortness of breath. She is found to be afebrile tachycardic and tachypnea. Workup reveals a chest x-ray consistent with pneumonia, as well as hyponatremia, hypocalcemia, hyperbilirubinemia, and lactic acidosis with a lactate of 4.4. This is all consistent with severe sepsis from healthcare associated pneumonia.  Patient is currently receiving 30 mL/kg IV fluid bolus of saline for resuscitation which should improve her hyponatremia and elevated lactate.. I have also ordered IV calcium for her hypomagnesemia. Due to her increased oxygen requirements and underlying COPD, she is currently on a nonrebreather for supplemental oxygen. I'm concerned about respiratory suppression as well as inability to adequately ventilate and potentially developing hypercapnia, so we will start BiPAP as well. I'll discuss the case with the hospitalist for admission.  I discussed our findings with the patient and family, and confirm that she is a full code.  CRITICAL CARE Performed by: Scotty CourtSTAFFORD, Briana Farner  Total critical care time: 35 minutes  Critical care time was exclusive of separately billable procedures and treating other patients.  Critical care was necessary to treat or prevent imminent or life-threatening deterioration.  Critical care was time spent personally by me on the following activities: development of treatment plan with patient and/or surrogate as well as nursing, discussions with consultants, evaluation of patient's response to treatment, examination of patient, obtaining history from patient or surrogate, ordering and performing treatments and interventions, ordering and review of laboratory studies, ordering and review of radiographic studies, pulse oximetry and re-evaluation of  patient's condition.  Final diagnoses:  Aspiration pneumonia of right upper lobe, unspecified aspiration pneumonia type (HCC)  Severe sepsis with acute organ dysfunction (HCC)  Hyponatremia  Hypocalcemia  Lactic acidosis     Sharman CheekPhillip Sayward Horvath, MD 05/27/15 [id number]  Sharman CheekPhillip Tarrin Menn, MD 05/27/15 [phone]

## 2015-05-27 NOTE — Procedures (Signed)
Oral Intubation Procedure Note   Procedure: Intubation Indications: suspected pill aspiration, intubate for FOB Consent: obtained from pt and her husband Time Out: Verified patient identification, verified procedure, site/side was marked, verified correct patient position, special equipment/implants available, medications/allergies/relevent history reviewed, required imaging and test results available.   Pre-meds: Etomidate 20 mg IV  Neuromuscular blockade: Rocuronium 50 mg IV  Laryngoscope: #3 Glidescope  Visualization: cords fully visualized  ETT: 8.0 ETT passed on first attempt and secured @ 22 cm at upper gums  Findings: very thick mucoid secretions in oropharynx   Evaluation:  + color change on colorimeter  Pt tolerated procedure well without complications   Billy Fischeravid Simonds, MD ; [facility] Mobile [phone].  After 5:30 PM or weekends, call [phone]

## 2015-05-27 NOTE — Consult Note (Signed)
PULMONARY / CRITICAL CARE MEDICINE   Name: [name] Stuart MRN: [id number] DOB: August 10, 1934    ADMISSION DATE:  05/27/2015 CONSULTATION DATE: 11/23  REFERRING MD : Dietrich Pates  PT PROFILE: 15F NH resident X several years after R hemispheric CVA with prior aspiration PNAs admitted via ED with acute respiratory distress and hypoxemia. CT chest reveals patchy infiltrates and "debris" in R main bronchus. Intubated 11/23 for FOB  HISTORY OF PRESENT ILLNESS:   80 y.o. female with a known history of cerebrovascular accident, with paralysis living in nursing home for last 2 years, using oxygen as needed basis and not able to walk but still able to feed herself with her arms, also history of the hypercholesterolemia, anxiety and bipolar disorder, esophageal reflux-was taken by husband yesterday to home for the holidays when she was at her baseline condition. She also uses albuterol nebulizers so husband gave her nebulizer treatment 2-3 times overnight but around 2 or 3 in the nighttime he noted that the patient is getting more and more short of breath in spite of using albuterol treatment and she felt very warm so he brought her to emergency room in the morning., In ER patient is noted to have right upper lobe pneumonia with questionable nodularity, hypoxic required BiPAP , high lactic acid, low calcium level, slight hypotension and tachycardia - and as per husband she is also more confused than her baseline - so she is given for admission for sepsis and pneumonia.   PAST MEDICAL HISTORY :  She  has a past medical history of Other specified rehabilitation procedure(V57.89); Unspecified constipation; Esophageal reflux; Chronic airway obstruction, not elsewhere classified; Other specified disorder of skin; Other symptoms involving skin and integumentary tissues; Difficulty in walking(719.7); Muscle weakness (generalized); Dysphagia, oropharyngeal phase; Cognitive communication deficit; Anxiety state,  unspecified; Unspecified essential hypertension; Unspecified late effects of cerebrovascular disease; Depressive disorder, not elsewhere classified; Other and unspecified hyperlipidemia; Restless legs syndrome (RLS); Other chronic pain; Insomnia, unspecified; Pressure ulcer, buttock(707.05); Unspecified episodic mood disorder; and Stroke (HCC).  PAST SURGICAL HISTORY: She  has past surgical history that includes Cholecystectomy and Bony pelvis surgery.  Allergies  Allergen Reactions  . Aspirin   . Codeine Sulfate   . Ibuprofen   . Penicillins     No current facility-administered medications on file prior to encounter.   Current Outpatient Prescriptions on File Prior to Encounter  Medication Sig  . acetaminophen (TYLENOL) 325 MG tablet Take 650 mg by mouth every 4 (four) hours as needed for mild pain, moderate pain or fever.   . ALPRAZolam (XANAX) 0.25 MG tablet Take 0.25 mg by mouth every 6 (six) hours as needed for anxiety.   Marland Kitchen amLODipine (NORVASC) 2.5 MG tablet Take 2.5 mg by mouth daily.  . clopidogrel (PLAVIX) 75 MG tablet Take 75 mg by mouth daily.   . miconazole (MICOTIN) 2 % cream Apply 1 application topically 2 (two) times daily. To buttocks and sacrum  . omeprazole (PRILOSEC) 20 MG capsule Take 20 mg by mouth every morning.   . polyethylene glycol (MIRALAX / GLYCOLAX) packet Take 17 g by mouth daily as needed for mild constipation, moderate constipation or severe constipation.   . pravastatin (PRAVACHOL) 10 MG tablet Take 10 mg by mouth at bedtime.     FAMILY HISTORY:  Her has no family status information on file.   SOCIAL HISTORY: She  reports that she has been smoking.  She does not have any smokeless tobacco history on file. She reports that she  does not drink alcohol.  REVIEW OF SYSTEMS:   As per HPI  SUBJECTIVE:   VITAL SIGNS: BP 140/75 mmHg  Pulse 108  Temp(Src) 99.5 F (37.5 C) (Core (Comment))  Resp 26  Ht  (1.676 m)  Wt 79.5 kg (175 lb 4.3 oz)  BMI  28.30 kg/m2  SpO2 100%  HEMODYNAMICS:    VENTILATOR SETTINGS: Vent Mode:  [-] PRVC FiO2 (%):  [50 %-100 %] 100 % Set Rate:  [14 bmp] 14 bmp Vt Set:  [400 mL] 400 mL PEEP:  [5 cmH20] 5 cmH20  INTAKE / OUTPUT:    PHYSICAL EXAMINATION: General: RASS 0, + F/C Neuro: Mild weakness on L HEENT: edentulous, NCAT Cardiovascular: Tachy, reg, no M Lungs: absent BS on R, minimal rhonchi on L, no wheezes Abdomen: obese, soft, NT, +BS Ext: warm, R>L LE edema  LABS:  CBC  Recent Labs Lab 05/27/15 0651  WBC 18.0*  HGB 14.2  HCT 43.0  PLT 325   Coag's No results for input(s): APTT, INR in the last 168 hours. BMET  Recent Labs Lab 05/27/15 0807  NA 126*  K 3.2*  CL 98*  CO2 20*  BUN 8  CREATININE 0.62  GLUCOSE 577*   Electrolytes  Recent Labs Lab 05/27/15 0807  CALCIUM 5.8*  MG 1.2*  PHOS 2.4*   Sepsis Markers  Recent Labs Lab 05/27/15 0652 05/27/15 0945  LATICACIDVEN 4.4* 2.3*   ABG No results for input(s): PHART, PCO2ART, PO2ART in the last 168 hours. Liver Enzymes  Recent Labs Lab 05/27/15 0807  AST 17  ALT 6*  ALKPHOS 102  BILITOT 2.0*  ALBUMIN 2.1*   Cardiac Enzymes No results for input(s): TROPONINI, PROBNP in the last 168 hours. Glucose  Recent Labs Lab 05/27/15 1324  GLUCAP 95    Imaging Ct Chest W Contrast  05/27/2015  CLINICAL DATA:  Altered mental status and shortness of breath. Recent pneumonia. EXAM: CT CHEST WITH CONTRAST TECHNIQUE: Multidetector CT imaging of the chest was performed during intravenous contrast administration. CONTRAST:  75mL OMNIPAQUE IOHEXOL 350 MG/ML SOLN COMPARISON:  Chest radiograph 05/27/2015. FINDINGS: Mediastinum/Nodes: Mural thrombus is seen within the right common carotid artery, with associated luminal narrowing (series 2, image 1). Numerous mediastinal lymph nodes, none of which are pathologically enlarged. No hilar or axillary adenopathy. Atherosclerotic calcification of the arterial vasculature,  including coronary arteries. Heart size normal. No pericardial effusion. Lungs/Pleura: Image quality is degraded by respiratory motion. Centrilobular emphysema. Scattered peribronchovascular nodularity bilaterally with areas of consolidation in the right lower lobe. Mild volume loss in the left lower lobe. No pleural fluid. Debris is seen within the right lower lobe bronchus (series 3, image 27), as well as within right lower lobe bronchi. Airway is suboptimally evaluated due to motion and expiratory phase imaging. Upper abdomen: Visualized portions of the liver, adrenal glands, left kidney, spleen, pancreas and stomach are grossly unremarkable. No upper abdominal adenopathy. Musculoskeletal: No worrisome lytic or sclerotic lesions. Degenerative changes are seen in the spine. There may be very mild compression of the inferior endplate of T8. IMPRESSION: 1. Debris in the right lower lobe bronchi with consolidation in the right lower lobe. Findings are indicative of pneumonia. Followup CT chest without contrast is recommended in 3-4 weeks following trial of antibiotic therapy to ensure resolution and exclude underlying malignancy. 2. Scattered peribronchovascular nodularity, also likely infectious. 3. Narrowing of the proximal right common carotid artery. 4. Coronary artery calcification. Electronically Signed   By: Leanna Battles M.D.  On: 05/27/2015 12:19   Dg Chest Portable 1 View  05/27/2015  CLINICAL DATA:  Severe shortness of breath beginning yesterday. Fever and cough. COPD. EXAM: PORTABLE CHEST 1 VIEW COMPARISON:  05/03/2015 FINDINGS: Heart size remains stable. Ectasia of the thoracic aorta is unchanged. No evidence of acute infiltrate, pleural effusion, or pneumothorax. New irregular nodular opacity is seen in the right upper lobe. This was not seen on previous study and small bronchogenic carcinoma cannot be excluded. IMPRESSION: Right upper lobe nodular opacity. Chest CT with contrast recommended to  exclude small bronchogenic carcinoma. Electronically Signed   By: Myles RosenthalJohn  Stahl M.D.   On: 05/27/2015 07:31     CULTURES: Blood 11/23 >>  BAL 11/23 >>   ANTIBIOTICS: Vanc 11/23 >>  Meropenem 11/23 >>   SIGNIFICANT EVENTS: 11/23 electively intubated for FOB  LINES/TUBES: ETT 11/23 >>   DISCUSSION: "debris" in R main bronchus does not have appearance of mucus. It is likely either pill or food matter. Less likely endobronchial tumor  ASSESSMENT / PLAN:  PULMONARY A: Acute respiratory failure H/O recurrent aspirations R mainstem bronchus lesion - suspect aspirated foreign body/material P:   Vent settings established Vent bundle implemented Daily SBT as indicated FOB today  CARDIOVASCULAR A:  No issues P:  Monitor hemodynamics  RENAL A:   Hypokalemia P:   Monitor BMET intermittently Monitor I/Os Correct electrolytes as indicated IVFs adjusted 11/23  GASTROINTESTINAL A:   Suspected dysphagia P:   SUP: IV famotidine Consider TFs if not extubated 11/24 Consider SLP eval before oral nutrition  HEMATOLOGIC A:   Asymmetric LE edema P:  DVT px: Enoxaparin Monitor CBC intermittently Transfuse per usual ICU guidelines LE venous Dopplers ordered 11/23  INFECTIOUS A:   Aspiration PNA P:   Monitor temp, WBC count Micro and abx as above  ENDOCRINE A:   Severe hyperglycemia without prior dx of DM   P:   SSI ordered  NEUROLOGIC A:   Prior CVA Chronic debilitation Chronic depression P:   RASS goal: -1, -2 PAD protocol   FAMILY  - Updates: I discussed the above plan with pt and her husband  CCM time: 35 mins The above time includes time spent in consultation with patient and/or family members and reviewing care plan on multidisciplinary rounds  Billy Fischeravid Tierany Appleby, MD PCCM service Mobile [phone] Pager [phone]     05/27/2015, 3:18 PM

## 2015-05-27 NOTE — Clinical Social Work Note (Signed)
Clinical Social Worker was consult for pt as she was admitted from [facility]. Pt is able to return to facility, if bed is available. Full assessment to follow. CSW spoke with addmissions coordinator, that there is a concern that family is providing food that is not the required consistency and causing pt to choke. CSW will continue to follow.   Dede QuerySarah Jadon Harbaugh, MSW, LCSW Clinical Social Worker  [phone]

## 2015-05-28 ENCOUNTER — Inpatient Hospital Stay: Payer: Medicare Other

## 2015-05-28 DIAGNOSIS — J690 Pneumonitis due to inhalation of food and vomit: Secondary | ICD-10-CM | POA: Insufficient documentation

## 2015-05-28 DIAGNOSIS — J9690 Respiratory failure, unspecified, unspecified whether with hypoxia or hypercapnia: Secondary | ICD-10-CM | POA: Insufficient documentation

## 2015-05-28 DIAGNOSIS — J189 Pneumonia, unspecified organism: Secondary | ICD-10-CM

## 2015-05-28 LAB — CBC
HCT: 35 % (ref 35.0–47.0)
Hemoglobin: 11.6 g/dL — ABNORMAL LOW (ref 12.0–16.0)
MCH: 29.9 pg (ref 26.0–34.0)
MCHC: 33 g/dL (ref 32.0–36.0)
MCV: 90.5 fL (ref 80.0–100.0)
PLATELETS: 256 10*3/uL (ref 150–440)
RBC: 3.87 MIL/uL (ref 3.80–5.20)
RDW: 14.3 % (ref 11.5–14.5)
WBC: 14.6 10*3/uL — ABNORMAL HIGH (ref 3.6–11.0)

## 2015-05-28 LAB — COMPREHENSIVE METABOLIC PANEL
ALT: 8 U/L — AB (ref 14–54)
ANION GAP: 3 — AB (ref 5–15)
AST: 13 U/L — ABNORMAL LOW (ref 15–41)
Albumin: 2.7 g/dL — ABNORMAL LOW (ref 3.5–5.0)
Alkaline Phosphatase: 113 U/L (ref 38–126)
BUN: 11 mg/dL (ref 6–20)
CHLORIDE: 104 mmol/L (ref 101–111)
CO2: 26 mmol/L (ref 22–32)
Calcium: 8.3 mg/dL — ABNORMAL LOW (ref 8.9–10.3)
Creatinine, Ser: 0.66 mg/dL (ref 0.44–1.00)
GFR calc non Af Amer: >60 mL/min (ref >60)
Glucose, Bld: 136 mg/dL — ABNORMAL HIGH (ref 65–99)
Potassium: 5 mmol/L (ref 3.5–5.1)
SODIUM: 133 mmol/L — AB (ref 135–145)
Total Bilirubin: 0.6 mg/dL (ref 0.3–1.2)
Total Protein: 5.7 g/dL — ABNORMAL LOW (ref 6.5–8.1)

## 2015-05-28 LAB — GLUCOSE, CAPILLARY
GLUCOSE-CAPILLARY: 127 mg/dL — AB (ref 65–99)
GLUCOSE-CAPILLARY: 100 mg/dL — AB (ref 65–99)
GLUCOSE-CAPILLARY: 99 mg/dL (ref 65–99)
Glucose-Capillary: 129 mg/dL — ABNORMAL HIGH (ref 65–99)
Glucose-Capillary: 130 mg/dL — ABNORMAL HIGH (ref 65–99)
Glucose-Capillary: 97 mg/dL (ref 65–99)

## 2015-05-28 LAB — MAGNESIUM: MAGNESIUM: 2 mg/dL (ref 1.7–2.4)

## 2015-05-28 LAB — PARATHYROID HORMONE, INTACT (NO CA): PTH: 36 pg/mL (ref 15–65)

## 2015-05-28 MED ORDER — SODIUM CHLORIDE 0.9 % IV SOLN
INTRAVENOUS | Status: DC
  Administered 2015-05-28 – 2015-06-02 (×2): via INTRAVENOUS

## 2015-05-28 MED ORDER — BUDESONIDE 0.25 MG/2ML IN SUSP
0.2500 mg | Freq: Two times a day (BID) | RESPIRATORY_TRACT | Status: DC
  Administered 2015-05-28 – 2015-06-04 (×15): 0.25 mg via RESPIRATORY_TRACT
  Filled 2015-05-28 (×17): qty 2

## 2015-05-28 NOTE — Progress Notes (Signed)
Patient ID: [name] Stuart, female   DOB: [birth year], 80 y.o.   MRN: [id number] [facility] PROGRESS NOTE  PCP: [facility]  HPI/Subjective: Patient on the ventilator and able to follow commands. As per pulmonary and nursing staff, the patient had shortness of breath and some agitation with weaning today. Patient had some lower abdominal discomfort.  Objective: Filed Vitals:   05/28/15 0500 05/28/15 0600  BP: 132/62 139/70  Pulse: 82 84  Temp: 98.6 F (37 C) 98.4 F (36.9 C)  Resp:      Filed Weights   05/27/15 0702 05/27/15 1214  Weight: 68.04 kg (150 lb) 79.5 kg (175 lb 4.3 oz)    ROS: Review of Systems  Unable to perform ROS Respiratory: Positive for shortness of breath.   Cardiovascular: Negative for chest pain.  Gastrointestinal: Positive for abdominal pain.   limited being that the patient is on the ventilator  Exam: Physical Exam  HENT:  Nose: No mucosal edema.  Mouth/Throat: No oropharyngeal exudate or posterior oropharyngeal edema.  Eyes: Conjunctivae and lids are normal. Pupils are equal, round, and reactive to light.  Neck: No JVD present. Carotid bruit is not present. No edema present. No thyroid mass and no thyromegaly present.  Cardiovascular: S1 normal and S2 normal.  Exam reveals no gallop.   No murmur heard. Pulses:      Dorsalis pedis pulses are 2+ on the right side, and 2+ on the left side.  Respiratory: No respiratory distress. She has wheezes in the right middle field, the right lower field, the left middle field and the left lower field. She has no rhonchi. She has no rales.  GI: Soft. Bowel sounds are normal. There is tenderness in the suprapubic area.  Musculoskeletal:       Right ankle: She exhibits swelling.       Left ankle: She exhibits swelling.  Lymphadenopathy:    She has no cervical adenopathy.  Neurological: She is alert.  Able to squeeze my hand with both her hands. Able to wiggle toes on both feet  Skin:  Skin is warm. Nails show no clubbing.  Chronic lower extremity discoloration  Psychiatric:  Patient shakes her head yes or no to questions.    Data Reviewed: Basic Metabolic Panel:  Recent Labs Lab 05/27/15 0807 05/28/15 0458  NA 126* 133*  K 3.2* 5.0  CL 98* 104  CO2 20* 26  GLUCOSE 577* 136*  BUN 8 11  CREATININE 0.62 0.66  CALCIUM 5.8* 8.3*  MG 1.2* 2.0  PHOS 2.4*  --    Liver Function Tests:  Recent Labs Lab 05/27/15 0807 05/28/15 0458  AST 17 13*  ALT 6* 8*  ALKPHOS 102 113  BILITOT 2.0* 0.6  PROT 4.7* 5.7*  ALBUMIN 2.1* 2.7*   CBC:  Recent Labs Lab 05/27/15 0651 05/28/15 0458  WBC 18.0* 14.6*  HGB 14.2 11.6*  HCT 43.0 35.0  MCV 90.7 90.5  PLT 325 256    CBG:  Recent Labs Lab 05/27/15 1729 05/27/15 2054 05/28/15 0020 05/28/15 0409 05/28/15 0731  GLUCAP 131* 122* 129* 130* 127*    Recent Results (from the past 240 hour(s))  Blood culture (routine x 2)     Status: None (Preliminary result)   Collection Time: 05/27/15  6:51 AM  Result Value Ref Range Status   Specimen Description BLOOD  Final   Special Requests NONE  Final   Culture NO GROWTH <12 HOURS  Final   Report Status PENDING  Incomplete  Blood culture (routine x 2)     Status: None (Preliminary result)   Collection Time: 05/27/15  6:52 AM  Result Value Ref Range Status   Specimen Description BLOOD RIGHT HAND  Final   Special Requests BOTTLES DRAWN AEROBIC AND ANAEROBIC  2CC  Final   Culture NO GROWTH <12 HOURS  Final   Report Status PENDING  Incomplete  MRSA PCR Screening     Status: None   Collection Time: 05/27/15 12:27 PM  Result Value Ref Range Status   MRSA by PCR NEGATIVE NEGATIVE Final    Comment:        The GeneXpert MRSA Assay (FDA approved for NASAL specimens only), is one component of a comprehensive MRSA colonization surveillance program. It is not intended to diagnose MRSA infection nor to guide or monitor treatment for MRSA infections.       Studies: Dg Chest 1 View  05/27/2015  CLINICAL DATA:  Encounter for intubation. EXAM: CHEST 1 VIEW COMPARISON:  Same day. FINDINGS: Stable cardiomediastinal silhouette. Endotracheal tube is in grossly good position with distal tip approximately 3.5 cm above the carina. Nasogastric tube is seen entering the stomach. No pneumothorax is noted. Mild bibasilar opacities are noted concerning for subsegmental atelectasis or edema with possible minimal associated pleural effusions. IMPRESSION: Endotracheal and nasogastric tubes in grossly good position. Mild bibasilar opacities are noted concerning for edema or subsegmental atelectasis with associated minimal pleural effusions. Electronically Signed   By: Lupita Raider, M.D.   On: 05/27/2015 17:21   Dg Abd 1 View  05/27/2015  CLINICAL DATA:  NG tube placement.  Initial encounter. EXAM: ABDOMEN - 1 VIEW COMPARISON:  Chest radiographs and CT same date. FINDINGS: 1708 hours. Nasogastric tube projects over the left upper quadrant of the abdomen, likely in the mid stomach. The visualized bowel gas pattern is normal. IMPRESSION: Nasogastric tube projects over the left upper quadrant of the abdomen, likely in the mid stomach. Electronically Signed   By: Carey Bullocks M.D.   On: 05/27/2015 17:19   Ct Chest W Contrast  05/27/2015  CLINICAL DATA:  Altered mental status and shortness of breath. Recent pneumonia. EXAM: CT CHEST WITH CONTRAST TECHNIQUE: Multidetector CT imaging of the chest was performed during intravenous contrast administration. CONTRAST:  75mL OMNIPAQUE IOHEXOL 350 MG/ML SOLN COMPARISON:  Chest radiograph 05/27/2015. FINDINGS: Mediastinum/Nodes: Mural thrombus is seen within the right common carotid artery, with associated luminal narrowing (series 2, image 1). Numerous mediastinal lymph nodes, none of which are pathologically enlarged. No hilar or axillary adenopathy. Atherosclerotic calcification of the arterial vasculature, including coronary  arteries. Heart size normal. No pericardial effusion. Lungs/Pleura: Image quality is degraded by respiratory motion. Centrilobular emphysema. Scattered peribronchovascular nodularity bilaterally with areas of consolidation in the right lower lobe. Mild volume loss in the left lower lobe. No pleural fluid. Debris is seen within the right lower lobe bronchus (series 3, image 27), as well as within right lower lobe bronchi. Airway is suboptimally evaluated due to motion and expiratory phase imaging. Upper abdomen: Visualized portions of the liver, adrenal glands, left kidney, spleen, pancreas and stomach are grossly unremarkable. No upper abdominal adenopathy. Musculoskeletal: No worrisome lytic or sclerotic lesions. Degenerative changes are seen in the spine. There may be very mild compression of the inferior endplate of T8. IMPRESSION: 1. Debris in the right lower lobe bronchi with consolidation in the right lower lobe. Findings are indicative of pneumonia. Followup CT chest without contrast is recommended in 3-4 weeks following trial  of antibiotic therapy to ensure resolution and exclude underlying malignancy. 2. Scattered peribronchovascular nodularity, also likely infectious. 3. Narrowing of the proximal right common carotid artery. 4. Coronary artery calcification. Electronically Signed   By: Leanna Battles M.D.   On: 05/27/2015 12:19   US Venous Img Lower Bilateral  05/28/2015  CLINICAL DATA:  Respiratory difficulty and bilateral leg swelling EXAM: BILATERAL LOWER EXTREMITY VENOUS DOPPLER ULTRASOUND TECHNIQUE: Gray-scale sonography with graded compression, as well as color Doppler and duplex ultrasound were performed to evaluate the lower extremity deep venous systems from the level of the common femoral vein and including the common femoral, femoral, profunda femoral, popliteal and calf veins including the posterior tibial, peroneal and gastrocnemius veins when visible. The superficial great saphenous vein  was also interrogated. Spectral Doppler was utilized to evaluate flow at rest and with distal augmentation maneuvers in the common femoral, femoral and popliteal veins. COMPARISON:  None. FINDINGS: RIGHT LOWER EXTREMITY Common Femoral Vein: No evidence of thrombus. Normal compressibility, respiratory phasicity and response to augmentation. Saphenofemoral Junction: No evidence of thrombus. Normal compressibility and flow on color Doppler imaging. Profunda Femoral Vein: No evidence of thrombus. Normal compressibility and flow on color Doppler imaging. Femoral Vein: No evidence of thrombus. Normal compressibility, respiratory phasicity and response to augmentation. Popliteal Vein: No evidence of thrombus. Normal compressibility, respiratory phasicity and response to augmentation. Calf Veins: No evidence of thrombus. Normal compressibility and flow on color Doppler imaging. Superficial Great Saphenous Vein: No evidence of thrombus. Normal compressibility and flow on color Doppler imaging. Venous Reflux:  None. Other Findings:  None. LEFT LOWER EXTREMITY Common Femoral Vein: No evidence of thrombus. Normal compressibility, respiratory phasicity and response to augmentation. Saphenofemoral Junction: No evidence of thrombus. Normal compressibility and flow on color Doppler imaging. Profunda Femoral Vein: No evidence of thrombus. Normal compressibility and flow on color Doppler imaging. Femoral Vein: No evidence of thrombus. Normal compressibility, respiratory phasicity and response to augmentation. Popliteal Vein: No evidence of thrombus. Normal compressibility, respiratory phasicity and response to augmentation. Calf Veins: No evidence of thrombus. Normal compressibility and flow on color Doppler imaging. Superficial Great Saphenous Vein: No evidence of thrombus. Normal compressibility and flow on color Doppler imaging. Venous Reflux:  None. Other Findings:  None. IMPRESSION: No evidence of deep venous thrombosis.  Electronically Signed   By: Alcide Clever M.D.   On: 05/28/2015 08:29   Dg Chest Port 1 View  05/28/2015  CLINICAL DATA:  Respiratory failure EXAM: PORTABLE CHEST - 1 VIEW COMPARISON:  05/27/2015 FINDINGS: Endotracheal tube and nasogastric catheter are again identified and stable. The cardiac shadow is unchanged. Aortic calcifications are again noted. The lungs are incompletely aerated. Increasing left basilar atelectasis is noted. Stable right basilar atelectasis is noted as well. IMPRESSION: Slight increase in the degree of left basilar atelectasis. Electronically Signed   By: Alcide Clever M.D.   On: 05/28/2015 08:17   Dg Chest Portable 1 View  05/27/2015  CLINICAL DATA:  Severe shortness of breath beginning yesterday. Fever and cough. COPD. EXAM: PORTABLE CHEST 1 VIEW COMPARISON:  05/03/2015 FINDINGS: Heart size remains stable. Ectasia of the thoracic aorta is unchanged. No evidence of acute infiltrate, pleural effusion, or pneumothorax. New irregular nodular opacity is seen in the right upper lobe. This was not seen on previous study and small bronchogenic carcinoma cannot be excluded. IMPRESSION: Right upper lobe nodular opacity. Chest CT with contrast recommended to exclude small bronchogenic carcinoma. Electronically Signed   By: Myles Rosenthal M.D.   On:  05/27/2015 07:31    Scheduled Meds: . antiseptic oral rinse  7 mL Mouth Rinse QID  . chlorhexidine gluconate  15 mL Mouth Rinse BID  . clopidogrel  75 mg Per Tube Daily  . enoxaparin (LOVENOX) injection  40 mg Subcutaneous Q24H  . escitalopram  10 mg Per Tube Daily  . famotidine (PEPCID) IV  20 mg Intravenous Q12H  . fluticasone  2 spray Each Nare Daily  . insulin aspart  0-15 Units Subcutaneous 6 times per day  . ipratropium-albuterol  3 mL Nebulization Q6H  . meropenem (MERREM) IV  1 g Intravenous 3 times per day  . miconazole  1 application Topical BID  . OLANZapine  2.5 mg Per Tube BID  . pravastatin  10 mg Per Tube QHS  . rOPINIRole   0.75 mg Per Tube QPM  . traZODone  150 mg Per Tube QHS  . vancomycin  1,000 mg Intravenous Q18H  . [START ON 06/25/2015] Vitamin D (Ergocalciferol)  50,000 Units Oral Q30 days   Continuous Infusions: . 0.9 % NaCl with KCl 20 mEq / L 75 mL/hr at 05/28/15 0735    Assessment/Plan:  1. Clinical sepsis, likely aspiration pneumonia.- Patient is on meropenem and vancomycin. Patient is status post bronchoscopy removing Peas from right main stem bronchus. Follow-up cultures. 2. Acute respiratory failure with hypoxia- patient did not do well today with the weaning protocol. Will rest on the vent today and try to extubate tomorrow. 3. Hyperglycemia on presentation- sugars have improved 4. Hypocalcemia on presentation- this is also improved 5. Wheezing in lungs with history of COPD start nebulizer treatments 6. History of stroke- on Plavix and pravastatin 7. Dysphasia- as per family, the patient has been worked up for swallow eval at [facility] recently. Will need another swallow evaluation after extubation.  Code Status:     Code Status Orders        Start     Ordered   05/27/15 1226  Full code   Continuous     05/27/15 1226    Advance Directive Documentation        Most Recent Value   Type of Advance Directive  Healthcare Power of Attorney, Living will   Pre-existing out of facility DNR order (yellow form or pink MOST form)     "MOST" Form in Place?       Family Communication: Family at bedside Disposition Plan: Back to [facility] once stable.  Consultants:  Critical care specialist  Procedures:  Bronchoscopy  ET tube placement  Antibiotics:  Meropenem  Vancomycin  Time spent: 35 minutes critical care time  Alford HighlandWIETING, Dayona Shaheen  [facility]

## 2015-05-28 NOTE — Progress Notes (Signed)
ANTIBIOTIC CONSULT NOTE - Follow Up  Pharmacy Consult for Vancomycin and Meropenem Indication: HCAP  Allergies  Allergen Reactions  . Aspirin   . Codeine Sulfate   . Ibuprofen   . Penicillins     Patient Measurements: Height: 5\' 6"  (167.6 cm) Weight: 175 lb 4.3 oz (79.5 kg) IBW/kg (Calculated) : 59.3 Adjusted Body Weight: 62.8 kg  Vital Signs: Temp: 98.4 F (36.9 C) (11/24 0600) BP: 139/70 mmHg (11/24 0600) Pulse Rate: 84 (11/24 0600) Intake/Output from previous day: 11/23 0701 - 11/24 0700 In: 1755 [I.V.:1005; IV Piggyback:750] Out: 920 [Urine:920] Intake/Output from this shift: Total I/O In: 118.8 [I.V.:118.8] Out: -   Labs:  Recent Labs  05/27/15 0651 05/27/15 0807 05/28/15 0458  WBC 18.0*  --  14.6*  HGB 14.2  --  11.6*  PLT 325  --  256  CREATININE  --  0.62 0.66   Estimated Creatinine Clearance: 61.7 mL/min (by C-G formula based on Cr of 0.66). No results for input(s): VANCOTROUGH, VANCOPEAK, VANCORANDOM, GENTTROUGH, GENTPEAK, GENTRANDOM, TOBRATROUGH, TOBRAPEAK, TOBRARND, AMIKACINPEAK, AMIKACINTROU, AMIKACIN in the last 72 hours.   Microbiology: Recent Results (from the past 720 hour(s))  Blood culture (routine x 2)     Status: None (Preliminary result)   Collection Time: 05/27/15  6:51 AM  Result Value Ref Range Status   Specimen Description BLOOD  Final   Special Requests NONE  Final   Culture NO GROWTH 1 DAY  Final   Report Status PENDING  Incomplete  Blood culture (routine x 2)     Status: None (Preliminary result)   Collection Time: 05/27/15  6:52 AM  Result Value Ref Range Status   Specimen Description BLOOD RIGHT HAND  Final   Special Requests BOTTLES DRAWN AEROBIC AND ANAEROBIC  2CC  Final   Culture NO GROWTH 1 DAY  Final   Report Status PENDING  Incomplete  MRSA PCR Screening     Status: None   Collection Time: 05/27/15 12:27 PM  Result Value Ref Range Status   MRSA by PCR NEGATIVE NEGATIVE Final    Comment:        The GeneXpert MRSA  Assay (FDA approved for NASAL specimens only), is one component of a comprehensive MRSA colonization surveillance program. It is not intended to diagnose MRSA infection nor to guide or monitor treatment for MRSA infections.     Medical History: Past Medical History  Diagnosis Date  . Other specified rehabilitation procedure(V57.89)   . Unspecified constipation   . Esophageal reflux   . Chronic airway obstruction, not elsewhere classified   . Other specified disorder of skin   . Other symptoms involving skin and integumentary tissues   . Difficulty in walking(719.7)   . Muscle weakness (generalized)   . Dysphagia, oropharyngeal phase   . Cognitive communication deficit   . Anxiety state, unspecified   . Unspecified essential hypertension   . Unspecified late effects of cerebrovascular disease   . Depressive disorder, not elsewhere classified   . Other and unspecified hyperlipidemia   . Restless legs syndrome (RLS)   . Other chronic pain   . Insomnia, unspecified   . Pressure ulcer, buttock(707.05)   . Unspecified episodic mood disorder   . Stroke [facility](HCC)     Medications:  Anti-infectives    Start     Dose/Rate Route Frequency Ordered Stop   05/28/15 1000  azithromycin (ZITHROMAX) 250 mg in dextrose 5 % 125 mL IVPB  Status:  Discontinued     250 mg 125  mL/hr over 60 Minutes Intravenous Every 24 hours 05/27/15 1108 05/27/15 1448   05/27/15 2200  meropenem (MERREM) 1 g in sodium chloride 0.9 % 100 mL IVPB     1 g 200 mL/hr over 30 Minutes Intravenous 3 times per day 05/27/15 1225     05/27/15 2100  vancomycin (VANCOCIN) IVPB 1000 mg/200 mL premix     1,000 mg 200 mL/hr over 60 Minutes Intravenous Every 18 hours 05/27/15 1238     05/27/15 1145  meropenem (MERREM) 1 g in sodium chloride 0.9 % 100 mL IVPB     1 g 200 mL/hr over 30 Minutes Intravenous  Once 05/27/15 1138 05/27/15 1410   05/27/15 0930  levofloxacin (LEVAQUIN) IVPB 750 mg     750 mg 100 mL/hr over 90  Minutes Intravenous  Once 05/27/15 0917 05/27/15 1121   05/27/15 0730  vancomycin (VANCOCIN) IVPB 1000 mg/200 mL premix     1,000 mg 200 mL/hr over 60 Minutes Intravenous  Once 05/27/15 0721 05/27/15 0841   05/27/15 0730  cefTRIAXone (ROCEPHIN) 1 g in dextrose 5 % 50 mL IVPB     1 g 100 mL/hr over 30 Minutes Intravenous  Once 05/27/15 1610 05/27/15 9604     Assessment: Patient is a 80 yo female admitted for possible HCAP, now being called possible aspiration PNA. While in the ED on 11/23 received Ceftriaxone 1g IV, Vancomycin 1g IV, and Levaquin  IV. Pharmacy was consulted to for dosing and monitoring of Meropenem and Vancomycin. MRSA swab negative on 05/27/15  Vancomycin Pharmacokinetic parameters: Ke=0.049 T1/2=14hr Vd=47.6 L  Goal of Therapy:  Vancomycin trough level 15-20 mcg/ml  Plan:   Will continue Vancomycin 1g IV q18 hours. Tough scheduled for 11/26 . Will continue Meropenem 1g IV q8h. Pharmacy will continue to monitor patient's renal function and labs and make adjustments as needed.  Will recommend considering DC of Vancomycin.  Cher Nakai, PharmD Pharmacy Resident 05/28/2015 11:14 AM

## 2015-05-28 NOTE — Progress Notes (Signed)
Pharmacy Antibiotic Time-Out Note  [name] Stuart is a 80 y.o. year-old female admitted on 05/27/2015.  The patient is currently on Meropenem and Vancomycin for Aspiration PNA.  Assessment/Plan: MRSA PCR negative, treating known aspiration PNA. Recommended D/C of Vancomycin. MD Approved.   Temp (24hrs), Avg:99.6 F (37.6 C), Min:98.4 F (36.9 C), Max:100.6 F (38.1 C)   Recent Labs Lab 05/27/15 0651 05/28/15 0458  WBC 18.0* 14.6*    Recent Labs Lab 05/27/15 0807 05/28/15 0458  CREATININE 0.62 0.66   Estimated Creatinine Clearance: 61.7 mL/min (by C-G formula based on Cr of 0.66).   Antimicrobial allergies: PCN/RASH  Antimicrobials this admission: 11/23 >> Vancomycin 11/23 >> Meropenem   Microbiology Results: 11/23 BCx: NG X 1 day  11/23 MRSA PCR: negative   Thank you for allowing pharmacy to be a part of this patient's care.  [name] Stuart PharmD 05/28/2015 1:50 PM

## 2015-05-28 NOTE — Progress Notes (Signed)
Pt sys BP 140-160, MD/N, no new orders received, nursing will cont to monitor

## 2015-05-28 NOTE — Consult Note (Signed)
PULMONARY / CRITICAL CARE MEDICINE   Name: [name] Stuart MRN: [id number] DOB: 25-Jan-1935    ADMISSION DATE:  05/27/2015 CONSULTATION DATE: 11/23  REFERRING MD : Dietrich Pates  PT PROFILE: 52F NH resident X several years after R hemispheric CVA with prior aspiration PNAs admitted via ED with acute respiratory distress and hypoxemia. CT chest reveals patchy infiltrates and "debris" in R main bronchus. Intubated 11/23 for FOB  HISTORY OF PRESENT ILLNESS:   80 y.o. female with a known history of cerebrovascular accident, with paralysis living in nursing home for last 2 years, using oxygen as needed basis and not able to walk but still able to feed herself with her arms, also history of the hypercholesterolemia, anxiety and bipolar disorder, esophageal reflux-was taken by husband yesterday to home for the holidays when she was at her baseline condition. She also uses albuterol nebulizers so husband gave her nebulizer treatment 2-3 times overnight but around 2 or 3 in the nighttime he noted that the patient is getting more and more short of breath in spite of using albuterol treatment and she felt very warm so he brought her to emergency room in the morning., In ER patient is noted to have right upper lobe pneumonia with questionable nodularity, hypoxic required BiPAP , high lactic acid, low calcium level, slight hypotension and tachycardia - and as per husband she is also more confused than her baseline - so she is given for admission for sepsis and pneumonia.   SUBJECTIVE: Awake, following simple commands, will give PSV trial today  PAST MEDICAL HISTORY :  She  has a past medical history of Other specified rehabilitation procedure(V57.89); Unspecified constipation; Esophageal reflux; Chronic airway obstruction, not elsewhere classified; Other specified disorder of skin; Other symptoms involving skin and integumentary tissues; Difficulty in walking(719.7); Muscle weakness (generalized);  Dysphagia, oropharyngeal phase; Cognitive communication deficit; Anxiety state, unspecified; Unspecified essential hypertension; Unspecified late effects of cerebrovascular disease; Depressive disorder, not elsewhere classified; Other and unspecified hyperlipidemia; Restless legs syndrome (RLS); Other chronic pain; Insomnia, unspecified; Pressure ulcer, buttock(707.05); Unspecified episodic mood disorder; and Stroke (HCC).  PAST SURGICAL HISTORY: She  has past surgical history that includes Cholecystectomy and Bony pelvis surgery.  Allergies  Allergen Reactions  . Aspirin   . Codeine Sulfate   . Ibuprofen   . Penicillins     No current facility-administered medications on file prior to encounter.   Current Outpatient Prescriptions on File Prior to Encounter  Medication Sig  . acetaminophen (TYLENOL) 325 MG tablet Take 650 mg by mouth every 4 (four) hours as needed for mild pain, moderate pain or fever.   . ALPRAZolam (XANAX) 0.25 MG tablet Take 0.25 mg by mouth every 6 (six) hours as needed for anxiety.   Marland Kitchen amLODipine (NORVASC) 2.5 MG tablet Take 2.5 mg by mouth daily.  . clopidogrel (PLAVIX) 75 MG tablet Take 75 mg by mouth daily.   . miconazole (MICOTIN) 2 % cream Apply 1 application topically 2 (two) times daily. To buttocks and sacrum  . omeprazole (PRILOSEC) 20 MG capsule Take 20 mg by mouth every morning.   . polyethylene glycol (MIRALAX / GLYCOLAX) packet Take 17 g by mouth daily as needed for mild constipation, moderate constipation or severe constipation.   . pravastatin (PRAVACHOL) 10 MG tablet Take 10 mg by mouth at bedtime.     FAMILY HISTORY:  Her has no family status information on file.   SOCIAL HISTORY: She  reports that she has been smoking.  She does not  have any smokeless tobacco history on file. She reports that she does not drink alcohol.  REVIEW OF SYSTEMS:   As per HPI  SUBJECTIVE:   VITAL SIGNS: BP 139/70 mmHg  Pulse 84  Temp(Src) 98.4 F (36.9 C)  (Oral)  Resp 17  Ht  (1.676 m)  Wt 175 lb 4.3 oz (79.5 kg)  BMI 28.30 kg/m2  SpO2 97%  HEMODYNAMICS:    VENTILATOR SETTINGS: Vent Mode:  [-] PRVC FiO2 (%):  [50 %-100 %] 60 % Set Rate:  [14 bmp] 14 bmp Vt Set:  [400 mL-500 mL] 500 mL PEEP:  [5 cmH20] 5 cmH20  INTAKE / OUTPUT: I/O last 3 completed shifts: In: 1755 [I.V.:1005; IV Piggyback:750] Out: 920 [Urine:920]  PHYSICAL EXAMINATION: General: RASS 0, + F/C Neuro: Mild weakness on L HEENT: edentulous, NCAT Cardiovascular: Tachy, reg, no M Lungs: absent BS on R, minimal rhonchi on L, no wheezes Abdomen: obese, soft, NT, +BS Ext: warm, R>L LE edema  LABS:  CBC  Recent Labs Lab 05/27/15 0651 05/28/15 0458  WBC 18.0* 14.6*  HGB 14.2 11.6*  HCT 43.0 35.0  PLT 325 256   Coag's No results for input(s): APTT, INR in the last 168 hours. BMET  Recent Labs Lab 05/27/15 0807 05/28/15 0458  NA 126* 133*  K 3.2* 5.0  CL 98* 104  CO2 20* 26  BUN 8 11  CREATININE 0.62 0.66  GLUCOSE 577* 136*   Electrolytes  Recent Labs Lab 05/27/15 0807 05/28/15 0458  CALCIUM 5.8* 8.3*  MG 1.2* 2.0  PHOS 2.4*  --    Sepsis Markers  Recent Labs Lab 05/27/15 0652 05/27/15 0945  LATICACIDVEN 4.4* 2.3*   ABG No results for input(s): PHART, PCO2ART, PO2ART in the last 168 hours. Liver Enzymes  Recent Labs Lab 05/27/15 0807 05/28/15 0458  AST 17 13*  ALT 6* 8*  ALKPHOS 102 113  BILITOT 2.0* 0.6  ALBUMIN 2.1* 2.7*   Cardiac Enzymes No results for input(s): TROPONINI, PROBNP in the last 168 hours. Glucose  Recent Labs Lab 05/27/15 1324 05/27/15 1729 05/27/15 2054 05/28/15 0020 05/28/15 0409 05/28/15 0731  GLUCAP 95 131* 122* 129* 130* 127*    Imaging Dg Chest 1 View  05/27/2015  CLINICAL DATA:  Encounter for intubation. EXAM: CHEST 1 VIEW COMPARISON:  Same day. FINDINGS: Stable cardiomediastinal silhouette. Endotracheal tube is in grossly good position with distal tip approximately 3.5 cm  above the carina. Nasogastric tube is seen entering the stomach. No pneumothorax is noted. Mild bibasilar opacities are noted concerning for subsegmental atelectasis or edema with possible minimal associated pleural effusions. IMPRESSION: Endotracheal and nasogastric tubes in grossly good position. Mild bibasilar opacities are noted concerning for edema or subsegmental atelectasis with associated minimal pleural effusions. Electronically Signed   By: Lupita Raider, M.D.   On: 05/27/2015 17:21   Dg Abd 1 View  05/27/2015  CLINICAL DATA:  NG tube placement.  Initial encounter. EXAM: ABDOMEN - 1 VIEW COMPARISON:  Chest radiographs and CT same date. FINDINGS: 1708 hours. Nasogastric tube projects over the left upper quadrant of the abdomen, likely in the mid stomach. The visualized bowel gas pattern is normal. IMPRESSION: Nasogastric tube projects over the left upper quadrant of the abdomen, likely in the mid stomach. Electronically Signed   By: Carey Bullocks M.D.   On: 05/27/2015 17:19   Ct Chest W Contrast  05/27/2015  CLINICAL DATA:  Altered mental status and shortness of breath. Recent pneumonia. EXAM: CT CHEST WITH  CONTRAST TECHNIQUE: Multidetector CT imaging of the chest was performed during intravenous contrast administration. CONTRAST:  75mL OMNIPAQUE IOHEXOL 350 MG/ML SOLN COMPARISON:  Chest radiograph 05/27/2015. FINDINGS: Mediastinum/Nodes: Mural thrombus is seen within the right common carotid artery, with associated luminal narrowing (series 2, image 1). Numerous mediastinal lymph nodes, none of which are pathologically enlarged. No hilar or axillary adenopathy. Atherosclerotic calcification of the arterial vasculature, including coronary arteries. Heart size normal. No pericardial effusion. Lungs/Pleura: Image quality is degraded by respiratory motion. Centrilobular emphysema. Scattered peribronchovascular nodularity bilaterally with areas of consolidation in the right lower lobe. Mild volume  loss in the left lower lobe. No pleural fluid. Debris is seen within the right lower lobe bronchus (series 3, image 27), as well as within right lower lobe bronchi. Airway is suboptimally evaluated due to motion and expiratory phase imaging. Upper abdomen: Visualized portions of the liver, adrenal glands, left kidney, spleen, pancreas and stomach are grossly unremarkable. No upper abdominal adenopathy. Musculoskeletal: No worrisome lytic or sclerotic lesions. Degenerative changes are seen in the spine. There may be very mild compression of the inferior endplate of T8. IMPRESSION: 1. Debris in the right lower lobe bronchi with consolidation in the right lower lobe. Findings are indicative of pneumonia. Followup CT chest without contrast is recommended in 3-4 weeks following trial of antibiotic therapy to ensure resolution and exclude underlying malignancy. 2. Scattered peribronchovascular nodularity, also likely infectious. 3. Narrowing of the proximal right common carotid artery. 4. Coronary artery calcification. Electronically Signed   By: Leanna BattlesMelinda  Blietz M.D.   On: 05/27/2015 12:19   [place] Venous Img Lower Bilateral  05/28/2015  CLINICAL DATA:  Respiratory difficulty and bilateral leg swelling EXAM: BILATERAL LOWER EXTREMITY VENOUS DOPPLER ULTRASOUND TECHNIQUE: Gray-scale sonography with graded compression, as well as color Doppler and duplex ultrasound were performed to evaluate the lower extremity deep venous systems from the level of the common femoral vein and including the common femoral, femoral, profunda femoral, popliteal and calf veins including the posterior tibial, peroneal and gastrocnemius veins when visible. The superficial great saphenous vein was also interrogated. Spectral Doppler was utilized to evaluate flow at rest and with distal augmentation maneuvers in the common femoral, femoral and popliteal veins. COMPARISON:  None. FINDINGS: RIGHT LOWER EXTREMITY Common Femoral Vein: No evidence of  thrombus. Normal compressibility, respiratory phasicity and response to augmentation. Saphenofemoral Junction: No evidence of thrombus. Normal compressibility and flow on color Doppler imaging. Profunda Femoral Vein: No evidence of thrombus. Normal compressibility and flow on color Doppler imaging. Femoral Vein: No evidence of thrombus. Normal compressibility, respiratory phasicity and response to augmentation. Popliteal Vein: No evidence of thrombus. Normal compressibility, respiratory phasicity and response to augmentation. Calf Veins: No evidence of thrombus. Normal compressibility and flow on color Doppler imaging. Superficial Great Saphenous Vein: No evidence of thrombus. Normal compressibility and flow on color Doppler imaging. Venous Reflux:  None. Other Findings:  None. LEFT LOWER EXTREMITY Common Femoral Vein: No evidence of thrombus. Normal compressibility, respiratory phasicity and response to augmentation. Saphenofemoral Junction: No evidence of thrombus. Normal compressibility and flow on color Doppler imaging. Profunda Femoral Vein: No evidence of thrombus. Normal compressibility and flow on color Doppler imaging. Femoral Vein: No evidence of thrombus. Normal compressibility, respiratory phasicity and response to augmentation. Popliteal Vein: No evidence of thrombus. Normal compressibility, respiratory phasicity and response to augmentation. Calf Veins: No evidence of thrombus. Normal compressibility and flow on color Doppler imaging. Superficial Great Saphenous Vein: No evidence of thrombus. Normal compressibility and flow on color  Doppler imaging. Venous Reflux:  None. Other Findings:  None. IMPRESSION: No evidence of deep venous thrombosis. Electronically Signed   By: Alcide Clever M.D.   On: 05/28/2015 08:29   Dg Chest Port 1 View  05/28/2015  CLINICAL DATA:  Respiratory failure EXAM: PORTABLE CHEST - 1 VIEW COMPARISON:  05/27/2015 FINDINGS: Endotracheal tube and nasogastric catheter are again  identified and stable. The cardiac shadow is unchanged. Aortic calcifications are again noted. The lungs are incompletely aerated. Increasing left basilar atelectasis is noted. Stable right basilar atelectasis is noted as well. IMPRESSION: Slight increase in the degree of left basilar atelectasis. Electronically Signed   By: Alcide Clever M.D.   On: 05/28/2015 08:17     CULTURES: Blood 11/23 >>  BAL 11/23 >>   ANTIBIOTICS: Vanc 11/23 >>  Meropenem 11/23 >>   SIGNIFICANT EVENTS: 11/23 electively intubated for FOB  LINES/TUBES: ETT 11/23 >>   DISCUSSION: "debris" in R main bronchus does not have appearance of mucus. It is likely either pill or food matter. Less likely endobronchial tumor  ASSESSMENT / PLAN:  PULMONARY A: Acute respiratory failure H/O recurrent aspirations R mainstem bronchus lesion - suspect aspirated foreign body/material P:   Vent settings established - PSV/SBT trial today Vent bundle implemented Daily SBT as indicated FOB today  CARDIOVASCULAR A:  No issues P:  Monitor hemodynamics  RENAL A:   Hypokalemia P:   Monitor BMET intermittently Monitor I/Os Correct electrolytes as indicated IVFs adjusted 11/23  GASTROINTESTINAL A:   Suspected dysphagia P:   SUP: IV famotidine Consider TFs if not extubated 11/24 Consider SLP eval before oral nutrition  HEMATOLOGIC A:   Asymmetric LE edema P:  DVT px: Enoxaparin Monitor CBC intermittently Transfuse per usual ICU guidelines LE venous Dopplers ordered 11/23  INFECTIOUS A:   Aspiration PNA P:   Monitor temp, WBC count Micro and abx as above  ENDOCRINE A:   Severe hyperglycemia without prior dx of DM   P:   SSI ordered  NEUROLOGIC A:   Prior CVA Chronic debilitation Chronic depression P:   RASS goal: -1, -2 PAD protocol   FAMILY  - Updates: I discussed the above plan with pt and her husband  I have personally obtained a history, examined the patient, evaluated laboratory  and imaging results, formulated the assessment and plan and placed orders. CRITICAL CARE: The patient is critically ill with multiple organ systems failure and requires high complexity decision making for assessment and support, frequent evaluation and titration of therapies, application of advanced monitoring technologies and extensive interpretation of multiple databases.   Critical Care Time devoted to patient care services described in this note is 45 minutes.    Stephanie Acre, MD [facility] Pager [phone] (please enter 7-digits) On Call Pager - [phone] (please enter 7-digits)       05/28/2015, 8:59 AM

## 2015-05-29 ENCOUNTER — Inpatient Hospital Stay: Payer: Medicare Other

## 2015-05-29 DIAGNOSIS — T17890D Other foreign object in other parts of respiratory tract causing asphyxiation, subsequent encounter: Secondary | ICD-10-CM

## 2015-05-29 LAB — BLOOD GAS, ARTERIAL
Acid-Base Excess: 0.2 mmol/L (ref 0.0–3.0)
BICARBONATE: 26 meq/L (ref 21.0–28.0)
FIO2: 0.35
MECHANICAL RATE: 12
MECHVT: 480 mL
O2 SAT: 92.5 %
PATIENT TEMPERATURE: 37
PCO2 ART: 46 mmHg (ref 32.0–48.0)
PEEP: 5 cmH2O
PO2 ART: 68 mmHg — AB (ref 83.0–108.0)
pH, Arterial: 7.36 (ref 7.350–7.450)

## 2015-05-29 LAB — CBC
HCT: 33.5 % — ABNORMAL LOW (ref 35.0–47.0)
Hemoglobin: 10.8 g/dL — ABNORMAL LOW (ref 12.0–16.0)
MCH: 29.2 pg (ref 26.0–34.0)
MCHC: 32.2 g/dL (ref 32.0–36.0)
MCV: 90.8 fL (ref 80.0–100.0)
PLATELETS: 266 10*3/uL (ref 150–440)
RBC: 3.69 MIL/uL — AB (ref 3.80–5.20)
RDW: 14.4 % (ref 11.5–14.5)
WBC: 9.7 10*3/uL (ref 3.6–11.0)

## 2015-05-29 LAB — GLUCOSE, CAPILLARY
GLUCOSE-CAPILLARY: 101 mg/dL — AB (ref 65–99)
GLUCOSE-CAPILLARY: 119 mg/dL — AB (ref 65–99)
GLUCOSE-CAPILLARY: 122 mg/dL — AB (ref 65–99)
GLUCOSE-CAPILLARY: 137 mg/dL — AB (ref 65–99)
GLUCOSE-CAPILLARY: 81 mg/dL (ref 65–99)
GLUCOSE-CAPILLARY: 83 mg/dL (ref 65–99)

## 2015-05-29 LAB — BLOOD GAS, VENOUS
ACID-BASE EXCESS: 0.7 mmol/L (ref 0.0–3.0)
Bicarbonate: 27.8 mEq/L (ref 21.0–28.0)
Patient temperature: 37
pCO2, Ven: 54 mmHg (ref 44.0–60.0)
pH, Ven: 7.32 (ref 7.320–7.430)

## 2015-05-29 LAB — BASIC METABOLIC PANEL
ANION GAP: 8 (ref 5–15)
BUN: 10 mg/dL (ref 6–20)
CALCIUM: 8.3 mg/dL — AB (ref 8.9–10.3)
CO2: 24 mmol/L (ref 22–32)
Chloride: 104 mmol/L (ref 101–111)
Creatinine, Ser: 0.5 mg/dL (ref 0.44–1.00)
GLUCOSE: 93 mg/dL (ref 65–99)
POTASSIUM: 3.9 mmol/L (ref 3.5–5.1)
SODIUM: 136 mmol/L (ref 135–145)

## 2015-05-29 LAB — TRIGLYCERIDES: TRIGLYCERIDES: 129 mg/dL (ref <150)

## 2015-05-29 MED ORDER — FENTANYL CITRATE (PF) 100 MCG/2ML IJ SOLN
INTRAMUSCULAR | Status: AC
  Administered 2015-05-29: 200 ug via INTRAVENOUS
  Filled 2015-05-29: qty 2

## 2015-05-29 MED ORDER — FENTANYL CITRATE (PF) 100 MCG/2ML IJ SOLN: 100.0000 ug | INTRAMUSCULAR | Status: DC | PRN

## 2015-05-29 MED ORDER — PROPOFOL 1000 MG/100ML IV EMUL
0.0000 ug/kg/min | INTRAVENOUS | Status: DC
  Administered 2015-05-29: 5 ug/kg/min via INTRAVENOUS
  Filled 2015-05-29: qty 100

## 2015-05-29 MED ORDER — DEXAMETHASONE SODIUM PHOSPHATE 10 MG/ML IJ SOLN
10.0000 mg | Freq: Once | INTRAMUSCULAR | Status: AC
  Administered 2015-05-29: 10 mg via INTRAVENOUS
  Filled 2015-05-29: qty 1

## 2015-05-29 MED ORDER — IPRATROPIUM-ALBUTEROL 0.5-2.5 (3) MG/3ML IN SOLN: 3.0000 mL | RESPIRATORY_TRACT | Status: DC | PRN

## 2015-05-29 MED ORDER — HYDRALAZINE HCL 20 MG/ML IJ SOLN: 20.0000 mg | Freq: Once | INTRAMUSCULAR | Status: DC

## 2015-05-29 MED ORDER — MIDAZOLAM HCL 2 MG/2ML IJ SOLN
4.0000 mg | Freq: Once | INTRAMUSCULAR | Status: AC
  Administered 2015-05-29: 4 mg via INTRAVENOUS

## 2015-05-29 MED ORDER — ACETYLCYSTEINE 20 % IN SOLN
2.0000 mL | Freq: Four times a day (QID) | RESPIRATORY_TRACT | Status: DC
  Administered 2015-05-29 – 2015-05-30 (×3): 2 mL via RESPIRATORY_TRACT
  Filled 2015-05-29 (×3): qty 4

## 2015-05-29 MED ORDER — MAGNESIUM SULFATE 2 GM/50ML IV SOLN
2.0000 g | Freq: Once | INTRAVENOUS | Status: AC
  Administered 2015-05-29: 2 g via INTRAVENOUS
  Filled 2015-05-29: qty 50

## 2015-05-29 MED ORDER — TRAMADOL HCL 50 MG PO TABS
50.0000 mg | ORAL_TABLET | Freq: Four times a day (QID) | ORAL | Status: DC | PRN
  Administered 2015-05-29 – 2015-06-04 (×7): 50 mg via ORAL
  Filled 2015-05-29 (×7): qty 1

## 2015-05-29 MED ORDER — SODIUM CHLORIDE 0.9 % IV SOLN
3.0000 g | Freq: Four times a day (QID) | INTRAVENOUS | Status: AC
  Administered 2015-05-29 – 2015-06-02 (×18): 3 g via INTRAVENOUS
  Filled 2015-05-29 (×20): qty 3

## 2015-05-29 MED ORDER — MIDAZOLAM HCL 2 MG/2ML IJ SOLN
INTRAMUSCULAR | Status: AC
  Administered 2015-05-29: 4 mg via INTRAVENOUS
  Filled 2015-05-29: qty 2

## 2015-05-29 MED ORDER — FENTANYL CITRATE (PF) 100 MCG/2ML IJ SOLN
INTRAMUSCULAR | Status: AC
  Administered 2015-05-29: 19:00:00
  Filled 2015-05-29: qty 2

## 2015-05-29 MED ORDER — FENTANYL CITRATE (PF) 100 MCG/2ML IJ SOLN
25.0000 ug | INTRAMUSCULAR | Status: AC
  Administered 2015-05-29: 25 ug via INTRAVENOUS
  Filled 2015-05-29: qty 2

## 2015-05-29 MED ORDER — VALPROATE SODIUM 250 MG/5ML PO SYRP
250.0000 mg | ORAL_SOLUTION | Freq: Three times a day (TID) | ORAL | Status: DC
  Administered 2015-05-29 – 2015-06-05 (×22): 250 mg via ORAL
  Filled 2015-05-29 (×25): qty 5

## 2015-05-29 MED ORDER — VECURONIUM BROMIDE 10 MG IV SOLR
10.0000 mg | Freq: Once | INTRAVENOUS | Status: AC
  Administered 2015-05-29: 10 mg via INTRAVENOUS

## 2015-05-29 MED ORDER — FENTANYL CITRATE (PF) 100 MCG/2ML IJ SOLN
200.0000 ug | Freq: Once | INTRAMUSCULAR | Status: AC
  Administered 2015-05-29 (×2): 200 ug via INTRAVENOUS

## 2015-05-29 MED ORDER — AMLODIPINE BESYLATE 5 MG PO TABS
5.0000 mg | ORAL_TABLET | Freq: Every day | ORAL | Status: DC
  Administered 2015-05-29 – 2015-06-05 (×6): 5 mg via ORAL
  Filled 2015-05-29 (×6): qty 1

## 2015-05-29 MED ORDER — METOPROLOL TARTRATE 1 MG/ML IV SOLN
5.0000 mg | Freq: Once | INTRAVENOUS | Status: AC
  Administered 2015-05-29: 5 mg via INTRAVENOUS
  Filled 2015-05-29: qty 5

## 2015-05-29 NOTE — Progress Notes (Signed)
Mrs. Okey DupreCrawford was extubated this morning at 1130 am. Was placed on 3L nasal cannula. Pt was having a hard time coughing up thick secretions. Tried repositioning, nebulizers, percussion, nasotracheal suctioning. Pt was eventually able to clear secretions and looked a lot better but throughout the shift she was having more labored breathing. Pt was re-intubated at 1900. HR dropped into 30's but was able to come back up on its on. Difficult intubation. Husband aware. U/o adequate. VSS.

## 2015-05-29 NOTE — Progress Notes (Signed)
Patient ID: [name] Stuart, female   DOB: Dec 15, 1934, 80 y.o.   MRN: [id number] [facility] PROGRESS NOTE  PCP: [facility] [place]  HPI/Subjective: Patient complaining of headache. She was just extubated this morning. Very congested and coughing. She did not do too well with the swallowing today  Objective: Filed Vitals:   05/29/15 0600 05/29/15 0700  BP: 141/70 153/83  Pulse: 87 88  Temp: 97.3 F (36.3 C) 97.3 F (36.3 C)  Resp: 16 18    Filed Weights   05/27/15 0702 05/27/15 1214  Weight: 68.04 kg (150 lb) 79.5 kg (175 lb 4.3 oz)    ROS: Review of Systems  Constitutional: Negative for fever and chills.  Respiratory: Positive for cough and shortness of breath.   Cardiovascular: Negative for chest pain.  Gastrointestinal: Positive for abdominal pain. Negative for nausea, vomiting, diarrhea and constipation.  Neurological: Positive for headaches.    Exam: Physical Exam  HENT:  Nose: No mucosal edema.  Mouth/Throat: No oropharyngeal exudate or posterior oropharyngeal edema.  Eyes: Conjunctivae and lids are normal. Pupils are equal, round, and reactive to light.  Neck: No JVD present. Carotid bruit is not present. No edema present. No thyroid mass and no thyromegaly present.  Cardiovascular: S1 normal and S2 normal.  Exam reveals no gallop.   No murmur heard. Pulses:      Dorsalis pedis pulses are 2+ on the right side, and 2+ on the left side.  Respiratory: Accessory muscle usage present. No respiratory distress. She has decreased breath sounds in the right lower field and the left lower field. She has wheezes in the right middle field, the right lower field, the left middle field and the left lower field. She has no rhonchi. She has no rales.  Stridor and upper airway heard  GI: Soft. Bowel sounds are normal. There is no tenderness.  Musculoskeletal:       Right ankle: She exhibits swelling.       Left ankle: She exhibits swelling.  Lymphadenopathy:     She has no cervical adenopathy.  Neurological: She is alert.  Unable to straight leg raise her legs up off the bed. Able to wiggle toes. Able to move both arms.  Skin: Skin is warm. Nails show no clubbing.  Chronic lower extremity discoloration  Psychiatric: She has a normal mood and affect.    Data Reviewed: Basic Metabolic Panel:  Recent Labs Lab 05/27/15 0807 05/28/15 0458 05/29/15 0625  NA 126* 133* 136  K 3.2* 5.0 3.9  CL 98* 104 104  CO2 20* 26 24  GLUCOSE 577* 136* 93  BUN 8 11 10   CREATININE 0.62 0.66 0.50  CALCIUM 5.8* 8.3* 8.3*  MG 1.2* 2.0  --   PHOS 2.4*  --   --    Liver Function Tests:  Recent Labs Lab 05/27/15 0807 05/28/15 0458  AST 17 13*  ALT 6* 8*  ALKPHOS 102 113  BILITOT 2.0* 0.6  PROT 4.7* 5.7*  ALBUMIN 2.1* 2.7*   CBC:  Recent Labs Lab 05/27/15 0651 05/28/15 0458 05/29/15 0625  WBC 18.0* 14.6* 9.7  HGB 14.2 11.6* 10.8*  HCT 43.0 35.0 33.5*  MCV 90.7 90.5 90.8  PLT 325 256 266    CBG:  Recent Labs Lab 05/28/15 1559 05/28/15 2000 05/28/15 2351 05/29/15 0438 05/29/15 0742  GLUCAP 97 99 101* 81 83    Recent Results (from the past 240 hour(s))  Blood culture (routine x 2)     Status: None (  Preliminary result)   Collection Time: 05/27/15  6:51 AM  Result Value Ref Range Status   Specimen Description BLOOD  Final   Special Requests NONE  Final   Culture NO GROWTH 2 DAYS  Final   Report Status PENDING  Incomplete  Urine culture     Status: None (Preliminary result)   Collection Time: 05/27/15  6:51 AM  Result Value Ref Range Status   Specimen Description URINE, CLEAN CATCH  Final   Special Requests NONE  Final   Culture NO GROWTH < 24 HOURS  Final   Report Status PENDING  Incomplete  Blood culture (routine x 2)     Status: None (Preliminary result)   Collection Time: 05/27/15  6:52 AM  Result Value Ref Range Status   Specimen Description BLOOD RIGHT HAND  Final   Special Requests BOTTLES DRAWN AEROBIC AND  ANAEROBIC  2CC  Final   Culture NO GROWTH 2 DAYS  Final   Report Status PENDING  Incomplete  MRSA PCR Screening     Status: None   Collection Time: 05/27/15 12:27 PM  Result Value Ref Range Status   MRSA by PCR NEGATIVE NEGATIVE Final    Comment:        The GeneXpert MRSA Assay (FDA approved for NASAL specimens only), is one component of a comprehensive MRSA colonization surveillance program. It is not intended to diagnose MRSA infection nor to guide or monitor treatment for MRSA infections.      Studies: Dg Chest 1 View  05/27/2015  CLINICAL DATA:  Encounter for intubation. EXAM: CHEST 1 VIEW COMPARISON:  Same day. FINDINGS: Stable cardiomediastinal silhouette. Endotracheal tube is in grossly good position with distal tip approximately 3.5 cm above the carina. Nasogastric tube is seen entering the stomach. No pneumothorax is noted. Mild bibasilar opacities are noted concerning for subsegmental atelectasis or edema with possible minimal associated pleural effusions. IMPRESSION: Endotracheal and nasogastric tubes in grossly good position. Mild bibasilar opacities are noted concerning for edema or subsegmental atelectasis with associated minimal pleural effusions. Electronically Signed   By: Lupita Raider, M.D.   On: 05/27/2015 17:21   Dg Abd 1 View  05/27/2015  CLINICAL DATA:  NG tube placement.  Initial encounter. EXAM: ABDOMEN - 1 VIEW COMPARISON:  Chest radiographs and CT same date. FINDINGS: 1708 hours. Nasogastric tube projects over the left upper quadrant of the abdomen, likely in the mid stomach. The visualized bowel gas pattern is normal. IMPRESSION: Nasogastric tube projects over the left upper quadrant of the abdomen, likely in the mid stomach. Electronically Signed   By: Carey Bullocks M.D.   On: 05/27/2015 17:19   US Venous Img Lower Bilateral  05/28/2015  CLINICAL DATA:  Respiratory difficulty and bilateral leg swelling EXAM: BILATERAL LOWER EXTREMITY VENOUS DOPPLER  ULTRASOUND TECHNIQUE: Gray-scale sonography with graded compression, as well as color Doppler and duplex ultrasound were performed to evaluate the lower extremity deep venous systems from the level of the common femoral vein and including the common femoral, femoral, profunda femoral, popliteal and calf veins including the posterior tibial, peroneal and gastrocnemius veins when visible. The superficial great saphenous vein was also interrogated. Spectral Doppler was utilized to evaluate flow at rest and with distal augmentation maneuvers in the common femoral, femoral and popliteal veins. COMPARISON:  None. FINDINGS: RIGHT LOWER EXTREMITY Common Femoral Vein: No evidence of thrombus. Normal compressibility, respiratory phasicity and response to augmentation. Saphenofemoral Junction: No evidence of thrombus. Normal compressibility and flow on color Doppler imaging.  Profunda Femoral Vein: No evidence of thrombus. Normal compressibility and flow on color Doppler imaging. Femoral Vein: No evidence of thrombus. Normal compressibility, respiratory phasicity and response to augmentation. Popliteal Vein: No evidence of thrombus. Normal compressibility, respiratory phasicity and response to augmentation. Calf Veins: No evidence of thrombus. Normal compressibility and flow on color Doppler imaging. Superficial Great Saphenous Vein: No evidence of thrombus. Normal compressibility and flow on color Doppler imaging. Venous Reflux:  None. Other Findings:  None. LEFT LOWER EXTREMITY Common Femoral Vein: No evidence of thrombus. Normal compressibility, respiratory phasicity and response to augmentation. Saphenofemoral Junction: No evidence of thrombus. Normal compressibility and flow on color Doppler imaging. Profunda Femoral Vein: No evidence of thrombus. Normal compressibility and flow on color Doppler imaging. Femoral Vein: No evidence of thrombus. Normal compressibility, respiratory phasicity and response to augmentation.  Popliteal Vein: No evidence of thrombus. Normal compressibility, respiratory phasicity and response to augmentation. Calf Veins: No evidence of thrombus. Normal compressibility and flow on color Doppler imaging. Superficial Great Saphenous Vein: No evidence of thrombus. Normal compressibility and flow on color Doppler imaging. Venous Reflux:  None. Other Findings:  None. IMPRESSION: No evidence of deep venous thrombosis. Electronically Signed   By: Alcide Clever M.D.   On: 05/28/2015 08:29   Dg Chest Port 1 View  05/28/2015  CLINICAL DATA:  Respiratory failure EXAM: PORTABLE CHEST - 1 VIEW COMPARISON:  05/27/2015 FINDINGS: Endotracheal tube and nasogastric catheter are again identified and stable. The cardiac shadow is unchanged. Aortic calcifications are again noted. The lungs are incompletely aerated. Increasing left basilar atelectasis is noted. Stable right basilar atelectasis is noted as well. IMPRESSION: Slight increase in the degree of left basilar atelectasis. Electronically Signed   By: Alcide Clever M.D.   On: 05/28/2015 08:17    Scheduled Meds: . amLODipine  5 mg Oral Daily  . ampicillin-sulbactam (UNASYN) IV  3 g Intravenous Q6H  . antiseptic oral rinse  7 mL Mouth Rinse QID  . budesonide (PULMICORT) nebulizer solution  0.25 mg Nebulization BID  . chlorhexidine gluconate  15 mL Mouth Rinse BID  . clopidogrel  75 mg Per Tube Daily  . dexamethasone  10 mg Intravenous Once  . enoxaparin (LOVENOX) injection  40 mg Subcutaneous Q24H  . escitalopram  10 mg Per Tube Daily  . famotidine (PEPCID) IV  20 mg Intravenous Q12H  . fluticasone  2 spray Each Nare Daily  . insulin aspart  0-15 Units Subcutaneous 6 times per day  . ipratropium-albuterol  3 mL Nebulization Q6H  . magnesium sulfate 1 - 4 g bolus IVPB  2 g Intravenous Once  . metoprolol  5 mg Intravenous Once  . miconazole  1 application Topical BID  . OLANZapine  2.5 mg Per Tube BID  . pravastatin  10 mg Per Tube QHS  . rOPINIRole   0.75 mg Per Tube QPM  . traZODone  150 mg Per Tube QHS  . valproic acid  250 mg Oral TID  . [START ON 06/25/2015] Vitamin D (Ergocalciferol)  50,000 Units Oral Q30 days   Continuous Infusions: . sodium chloride 50 mL/hr at 05/28/15 2000    Assessment/Plan:  1. Clinical sepsis, likely aspiration pneumonia.- Patient is Unasyn. Patient is status post bronchoscopy removing Peas from right main stem bronchus. Follow-up cultures. 2. Acute respiratory failure with hypoxia- patient now having some stridor. I spoke with nursing staff and respiratory therapist. Patient is high risk for reintubation. Need to watch in the ICU at least for another 24  hours. Continue nasal cannula oxygen supplementation. Patient needs frequent suctioning. Patient will be given a dose of Decadron now. 3. Hyperglycemia on presentation- sugars have improved 4. Hypocalcemia on presentation- this is also improved 5. Wheezing in lungs with history of COPD start nebulizer treatments 6. History of stroke- on Plavix and pravastatin 7. Dysphasia- patient was only cleared for meds in applesauce.  8. headache- will give a dose of Decadron and magnesium. Patient has an allergy to aspirin and ibuprofen so I cannot give Toradol.  Code Status:     Code Status Orders        Start     Ordered   05/27/15 1226  Full code   Continuous     05/27/15 1226    Advance Directive Documentation        Most Recent Value   Type of Advance Directive  Healthcare Power of Attorney, Living will   Pre-existing out of facility DNR order (yellow form or pink MOST form)     "MOST" Form in Place?       Family Communication: Family at bedside Disposition Plan: Back to [facility] once stable.  Consultants:  Critical care specialist  Procedures:  Bronchoscopy  ET tube placement  Antibiotics:  Unasyn   Time spent: 20 minutes   Alford Highland  [facility]

## 2015-05-29 NOTE — Progress Notes (Addendum)
eLink Physician-Brief Progress Note Patient Name: [name] Stuart DOB: 1935/02/21 MRN: [id number]   Date of Service  05/29/2015  HPI/Events of Note  Obtunded and guppy breathing  eICU Interventions  Needs intubation - RN to call RRT and  Hospitalist     Intervention Category Major Interventions: Change in mental status - evaluation and management;Respiratory failure - evaluation and management  Thaily Hackworth 05/29/2015, 6:46 PM

## 2015-05-29 NOTE — Evaluation (Signed)
Clinical/Bedside Swallow Evaluation Patient Details  Name: [name] Stuart MRN: [id number] Date of Birth: October 14, 1934  Today's Date: 05/29/2015 Time: SLP Start Time (ACUTE ONLY): 1030 SLP Stop Time (ACUTE ONLY): 1120 SLP Time Calculation (min) (ACUTE ONLY): 50 min  Past Medical History:  Past Medical History  Diagnosis Date  . Other specified rehabilitation procedure(V57.89)   . Unspecified constipation   . Esophageal reflux   . Chronic airway obstruction, not elsewhere classified   . Other specified disorder of skin   . Other symptoms involving skin and integumentary tissues   . Difficulty in walking(719.7)   . Muscle weakness (generalized)   . Dysphagia, oropharyngeal phase   . Cognitive communication deficit   . Anxiety state, unspecified   . Unspecified essential hypertension   . Unspecified late effects of cerebrovascular disease   . Depressive disorder, not elsewhere classified   . Other and unspecified hyperlipidemia   . Restless legs syndrome (RLS)   . Other chronic pain   . Insomnia, unspecified   . Pressure ulcer, buttock(707.05)   . Unspecified episodic mood disorder   . Stroke [facility])    Past Surgical History:  Past Surgical History  Procedure Laterality Date  . Cholecystectomy    . Bony pelvis surgery     HPI:  [name] Stuart is a 80 y.o. female with a known history of cerebrovascular accident, with paralysis living in nursing home for last 2 years, using oxygen as needed basis and not able to walk but still able to feed herself with her arms, also history of the hypercholesterolemia, anxiety and bipolar disorder, esophageal reflux-was taken by husband yesterday to home for the holidays when she was at her baseline condition. She also uses albuterol nebulizers so husband gave her nebulizer treatment 2-3 times overnight but around 2 or 3 in the nighttime he noted that the patient is getting more and more short of breath in spite of using albuterol treatment and she  felt very warm so he brought her to emergency room in the morning., In ER patient is noted to have right upper lobe pneumonia with questionable nodularity, hypoxic required BiPAP , high lactic acid, low calcium level, slight hypotension and tachycardia - and as per husband she is also more confused than her baseline - so she is given for admission for sepsis and pneumonia. Pt recently extuabted this am and nsg reports she would like to know if she is able to take oral medications.    Assessment / Plan / Recommendation Clinical Impression  Pt demonstrated pharyngeal dysphagia c/b suspected swallow delay resulting in overt s/s of aspiration w/tsp of honey thick liquids. Pt was recently extubated this am after admission for right lower lobe pneumonia and per report peas were removed during bronchoscope. Pt also has a h/o dysphagia and has been on Dysphagia III diet w/nectar thick liquids and is also being followed by ST at SNF. Per family report they were unsure if the SLP was planning on placing pt on honey thick liquids. Pt demonstrated immediate cough followin 2/3 tsps of honey thick liquids. Pt appeared to tolerate puree by tsp well. Vocal quality remained clear and no overt s/s of aspiration were observed with puree. Did not assess liquids thinner than honey or solid consistencies d/t pt's h/o dysphagia. Oral mech exam revealed oral  motor abilities to be grossly [facility] Pt is judged to be high risk of aspiration d/t current respiratory status, h/o dysphagia and s/s of aspiration. Recommend continue w/NPO except meds crushed in  applesauce as necessary. Please observed strict aspiration precautions when giving medications.  Will f/u with further trials at bedside in hopes of implementing diet.     Aspiration Risk  Severe aspiration risk    Diet Recommendation NPO except meds   Medication Administration: Crushed with puree Supervision: Full supervision/cueing for compensatory strategies Compensations:  Minimize environmental distractions;Slow rate;Small sips/bites Postural Changes: Seated upright at 90 degrees    Other  Recommendations Oral Care Recommendations: Oral care BID;Staff/trained caregiver to provide oral care   Follow up Recommendations  Skilled Nursing facility    Frequency and Duration min 3x week  2 weeks       Prognosis Prognosis for Safe Diet Advancement: Guarded Barriers to Reach Goals: Severity of deficits      Swallow Study   General Date of Onset: 05/27/15 HPI: [name] Stuart is a 80 y.o. female with a known history of cerebrovascular accident, with paralysis living in nursing home for last 2 years, using oxygen as needed basis and not able to walk but still able to feed herself with her arms, also history of the hypercholesterolemia, anxiety and bipolar disorder, esophageal reflux-was taken by husband yesterday to home for the holidays when she was at her baseline condition. She also uses albuterol nebulizers so husband gave her nebulizer treatment 2-3 times overnight but around 2 or 3 in the nighttime he noted that the patient is getting more and more short of breath in spite of using albuterol treatment and she felt very warm so he brought her to emergency room in the morning., In ER patient is noted to have right upper lobe pneumonia with questionable nodularity, hypoxic required BiPAP , high lactic acid, low calcium level, slight hypotension and tachycardia - and as per husband she is also more confused than her baseline - so she is given for admission for sepsis and pneumonia. Pt recently extuabted this am and nsg reports she would like to know if she is able to take oral medications.  Type of Study: Bedside Swallow Evaluation Previous Swallow Assessment: Pt's family reports that pt has been on Dysphagia 3 diet w/nectar thick liquids and that she has been previously assessed with MBSS. Reports that pt is currently being followed by ST at SNF.  Diet Prior to this  Study: NPO Temperature Spikes Noted: N/A Respiratory Status: Nasal cannula History of Recent Intubation: Yes Length of Intubations (days): 1 days Date extubated: 05/29/15 Behavior/Cognition: Alert;Cooperative;Pleasant mood Oral Cavity Assessment: Within Functional Limits Oral Care Completed by SLP: No Oral Cavity - Dentition: Edentulous Vision: Functional for self-feeding Self-Feeding Abilities: Total assist Patient Positioning: Upright in bed Baseline Vocal Quality: Aphonic Volitional Cough: Weak Volitional Swallow: Able to elicit    Oral/Motor/Sensory Function Overall Oral Motor/Sensory Function: Within functional limits   Ice Chips Ice chips: Not tested   Thin Liquid Thin Liquid: Not tested    Nectar Thick Nectar Thick Liquid: Not tested   Honey Thick Honey Thick Liquid: Impaired Presentation: Spoon Pharyngeal Phase Impairments: Suspected delayed Swallow;Cough - Immediate   Puree Puree: Within functional limits Presentation: Spoon   Solid Solid: Not tested       Isola,Maaran 05/29/2015,11:57 AM

## 2015-05-29 NOTE — [place] FL2 (Signed)
MEDICAID FL2 LEVEL OF CARE SCREENING TOOL     IDENTIFICATION  Patient Name: [name] Stuart Birthdate: 1934-10-25 Sex: female Admission Date (Current Location): 05/27/2015  [facility] and [place] Number:  [address] )  ([id number]) Facility and Address:  [facility], [address], [place], [place] [address]      Provider Number: [id number]  Attending Physician Name and Address:  Alford Highland, MD  Relative Name and Phone Number:       Current Level of Care: Hospital Recommended Level of Care: Skilled Nursing Facility Prior Approval Number:    Date Approved/Denied:   PASRR Number:  [id number])  Discharge Plan: SNF    Current Diagnoses: Patient Active Problem List   Diagnosis Date Noted  . Aspiration pneumonia of right upper lobe (HCC)   . Respiratory failure (HCC)   . HCAP (healthcare-associated pneumonia) 05/27/2015  . Aspiration of food     Orientation ACTIVITIES/SOCIAL BLADDER RESPIRATION    Self, Time, Situation, Place    Indwelling catheter    BEHAVIORAL SYMPTOMS/MOOD NEUROLOGICAL BOWEL NUTRITION STATUS      Continent Diet (puree)  PHYSICIAN VISITS COMMUNICATION OF NEEDS Height & Weight Skin    Verbally  (167.6 cm) 150 lbs. Normal          AMBULATORY STATUS RESPIRATION    Assist extensive        Personal Care Assistance Level of Assistance  Bathing, Feeding, Dressing Bathing Assistance: Maximum assistance Feeding assistance: Limited assistance Dressing Assistance: Maximum assistance      Functional Limitations Info  Sight, Hearing, Speech Sight Info: Adequate Hearing Info: Adequate Speech Info: Impaired       SPECIAL CARE FACTORS FREQUENCY  Speech therapy                   Additional Factors Info  Allergies, Psychotropic   Allergies Info:  (Aspirin, Codeine Sulfate, Ibuprofen, Penicillins)           Current Medications (05/29/2015): Current Facility-Administered Medications   Medication Dose Route Frequency Provider Last Rate Last Dose  . 0.9 %  sodium chloride infusion   Intravenous Continuous Alford Highland, MD 50 mL/hr at 05/28/15 2000    . acetaminophen (TYLENOL) solution 650 mg  650 mg Per Tube Q4H PRN Merwyn Katos, MD      . albuterol (PROVENTIL) (2.5 MG/3ML) 0.083% nebulizer solution 2.5 mg  2.5 mg Nebulization Q3H PRN Altamese Dilling, MD      . ALPRAZolam Prudy Feeler) tablet 0.25 mg  0.25 mg Oral Q6H PRN Altamese Dilling, MD      . amLODipine (NORVASC) tablet 5 mg  5 mg Oral Daily Alford Highland, MD   5 mg at 05/29/15 1132  . Ampicillin-Sulbactam (UNASYN) 3 g in sodium chloride 0.9 % 100 mL IVPB  3 g Intravenous Q6H Vishal Mungal, MD      . antiseptic oral rinse solution (CORINZ)  7 mL Mouth Rinse QID Merwyn Katos, MD   7 mL at 05/29/15 1147  . budesonide (PULMICORT) nebulizer solution 0.25 mg  0.25 mg Nebulization BID Alford Highland, MD   0.25 mg at 05/29/15 0748  . chlorhexidine gluconate (PERIDEX) 0.12 % solution 15 mL  15 mL Mouth Rinse BID Merwyn Katos, MD   15 mL at 05/29/15 0817  . clopidogrel (PLAVIX) tablet 75 mg  75 mg Per Tube Daily Merwyn Katos, MD   75 mg at 05/29/15 1132  . enoxaparin (LOVENOX) injection 40 mg  40 mg Subcutaneous Q24H  Altamese Dilling, MD   40 mg at 05/28/15 2226  . escitalopram (LEXAPRO) tablet 10 mg  10 mg Per Tube Daily Merwyn Katos, MD   10 mg at 05/29/15 1132  . famotidine (PEPCID) IVPB 20 mg premix  20 mg Intravenous Q12H Merwyn Katos, MD   20 mg at 05/28/15 2237  . fentaNYL (SUBLIMAZE) injection 50 mcg  50 mcg Intravenous Q2H PRN Merwyn Katos, MD      . fluticasone [facility]) 50 MCG/ACT nasal spray 2 spray  2 spray Each Nare Daily Altamese Dilling, MD   2 spray at 05/28/15 1059  . insulin aspart (novoLOG) injection 0-15 Units  0-15 Units Subcutaneous 6 times per day Merwyn Katos, MD   2 Units at 05/28/15 [phone]  . ipratropium-albuterol (DUONEB) 0.5-2.5 (3) MG/3ML nebulizer solution 3  mL  3 mL Nebulization Q6H Merwyn Katos, MD   3 mL at 05/29/15 0748  . metoprolol (LOPRESSOR) injection 5 mg  5 mg Intravenous Once Vishal Mungal, MD      . miconazole (MICOTIN) 2 % cream 1 application  1 application Topical BID Altamese Dilling, MD   1 application at 05/29/15 1148  . midazolam (VERSED) injection 1 mg  1 mg Intravenous Q2H PRN Merwyn Katos, MD      . Dennison Bulla [facility]) tablet 2.5 mg  2.5 mg Per Tube BID Merwyn Katos, MD   2.5 mg at 05/29/15 1132  . polyethylene glycol (MIRALAX / GLYCOLAX) packet 17 g  17 g Per Tube Daily PRN Merwyn Katos, MD      . pravastatin (PRAVACHOL) tablet 10 mg  10 mg Per Tube QHS Merwyn Katos, MD   10 mg at 05/28/15 2226  . rOPINIRole (REQUIP) tablet 0.75 mg  0.75 mg Per Tube QPM Merwyn Katos, MD   0.75 mg at 05/28/15 1722  . traMADol (ULTRAM) tablet 50 mg  50 mg Oral Q6H PRN Vishal Mungal, MD      . traZODone (DESYREL) tablet 150 mg  150 mg Per Tube QHS Merwyn Katos, MD   150 mg at 05/28/15 2226  . valproic acid (DEPAKENE) 250 MG/5ML syrup 250 mg  250 mg Oral TID Stephanie Acre, MD      . Melene Muller ON 06/25/2015] Vitamin D (Ergocalciferol) (DRISDOL) capsule 50,000 Units  50,000 Units Oral Q30 days Altamese Dilling, MD       Do not use this list as official medication orders. Please verify with discharge summary.  Discharge Medications:   Medication List    ASK your doctor about these medications        acetaminophen 325 MG tablet  Commonly known as:  TYLENOL  Take 650 mg by mouth every 4 (four) hours as needed for mild pain, moderate pain or fever.     albuterol (2.5 MG/3ML) 0.083% nebulizer solution  Commonly known as:  PROVENTIL  Take 2.5 mg by nebulization every 3 (three) hours as needed for wheezing or shortness of breath.     ALPRAZolam 0.25 MG tablet  Commonly known as:  XANAX  Take 0.25 mg by mouth every 6 (six) hours as needed for anxiety.     amLODipine 2.5 MG tablet  Commonly known as:  NORVASC  Take  2.5 mg by mouth daily.     divalproex 250 MG 24 hr tablet  Commonly known as:  DEPAKOTE ER  Take 750 mg by mouth every evening.     escitalopram 10 MG tablet  Commonly known  asJudye Bos:  LEXAPRO  Take 10 mg by mouth daily.     fluticasone 50 MCG/ACT nasal spray  Commonly known as:  FLONASE  Place 2 sprays into both nostrils daily.     miconazole 2 % cream  Commonly known as:  MICOTIN  Apply 1 application topically 2 (two) times daily. To buttocks and sacrum     nystatin 100000 UNIT/GM Powd  Apply topically 2 (two) times daily. To groin     OLANZapine 2.5 MG tablet  Commonly known as:  ZYPREXA  Take 2.5 mg by mouth 2 (two) times daily.     omeprazole 20 MG capsule  Commonly known as:  PRILOSEC  Take 20 mg by mouth every morning.     PLAVIX 75 MG tablet  Generic drug:  clopidogrel  Take 75 mg by mouth daily.     polyethylene glycol packet  Commonly known as:  MIRALAX / GLYCOLAX  Take 17 g by mouth daily as needed for mild constipation, moderate constipation or severe constipation.     pravastatin 10 MG tablet  Commonly known as:  PRAVACHOL  Take 10 mg by mouth at bedtime.     rOPINIRole 0.25 MG tablet  Commonly known as:  REQUIP  Take 0.75 mg by mouth every evening.     traMADol 50 MG tablet  Commonly known as:  ULTRAM  Take 50 mg by mouth every 6 (six) hours as needed for moderate pain or severe pain.     traZODone 150 MG tablet  Commonly known as:  DESYREL  Take 150 mg by mouth at bedtime.     Vitamin D (Ergocalciferol) 50000 UNITS Caps capsule  Commonly known as:  DRISDOL  Take 50,000 Units by mouth every 30 (thirty) days. On the 22nd of each month        Relevant Imaging Results:  Relevant Lab Results:  Recent Labs    Additional Information  (SS: [id number])  Soundra PilonMoore, Khadir Roam H, LCSW

## 2015-05-29 NOTE — Progress Notes (Signed)
eLink Physician-Brief Progress Note Patient Name: Katheren ShamsGlera B Andy DOB: 1935/03/23 MRN: [id number]   Date of Service  05/29/2015  HPI/Events of Note  Post intubatin camera cehck - comfortable on vent  csr - et tube 2cm above carina  eICU Interventions  dipivan o for sedation Await abg     Intervention Category Intermediate Interventions: Other:  Makena Murdock 05/29/2015, 8:14 PM

## 2015-05-29 NOTE — Evaluation (Signed)
Physical Therapy Evaluation Patient Details Name: [name] Stuart MRN: [id number] DOB: 31-Dec-1934 Today's Date: 05/29/2015   History of Present Illness  Pt is a 80 y/o female that has been a SNF resident ~ 2 years now, she went home for the holidays with family and became short of breath. She was intubated on this admission, and is now extubated. At baseline she has been transferring via an overhead lift to a wheelchair and is able to feed herself.   Clinical Impression  Patient has a history of CVA leaving her unable to complete bed mobility, transfers, etc. From family report patient has been using a lift for total assist to a wheelchair where she is able to sit with back support. Family reports she has not attempted sitting at EOB for at least 1 year. Today patient demonstrates R quad activation MMT 1/5, 2+/5 great toe flexion/extension bilaterally. No palpable contractions of other toe flexors/extensors. She is able to provide initial contraction but requires essentially PROM to complete heel slides bilaterally. She is able to touch the top of her head with RUE and has 4/5 grip strength, elbow flexion/extension strength. LUE 3-/5 elbow flexion, 4-/5 grip strength. This appears to be roughly her baseline physically, though she would benefit from skilled PT services to address her seated balance to return to use of wheelchair and positioning as she tends to "fall" onto her left side even in bed.     Follow Up Recommendations Home health PT (at [facility])     Equipment Recommendations       Recommendations for Other Services       Precautions / Restrictions Precautions Precautions: None Restrictions Weight Bearing Restrictions: No      Mobility  Bed Mobility               General bed mobility comments: Deferred as patient is unable to complete at baseline  Transfers                    Ambulation/Gait                Stairs            Wheelchair  Mobility    Modified Rankin (Stroke Patients Only)       Balance                                             Pertinent Vitals/Pain Pain Assessment: No/denies pain    Home Living Family/patient expects to be discharged to:: Skilled nursing facility                      Prior Function Level of Independence: Needs assistance   Gait / Transfers Assistance Needed: Pt has used a lift to transfer to a w/c (more for positioning, she is unable to complete mobility in chair).   ADL's / Homemaking Assistance Needed: She is able to feed herself with RUE.         Hand Dominance        Extremity/Trunk Assessment   Upper Extremity Assessment:  (See assessment for full evaluation)                     Communication   Communication:  (Soft spoken)  Cognition Arousal/Alertness: Awake/alert;Lethargic Behavior During Therapy: WFL for tasks assessed/performed Overall Cognitive Status:  (She  appears oriented and somewhat alert, she drifts between alert and lethargic.)                      General Comments      Exercises        Assessment/Plan    PT Assessment Patient needs continued PT services  PT Diagnosis Generalized weakness   PT Problem List Decreased balance;Decreased activity tolerance;Cardiopulmonary status limiting activity  PT Treatment Interventions Therapeutic exercise;Therapeutic activities;Balance training;Patient/family education   PT Goals (Current goals can be found in the Care Plan section) Acute Rehab PT Goals Patient Stated Goal: To return to [facility]  PT Goal Formulation: With patient/family Time For Goal Achievement: 06/12/15 Potential to Achieve Goals: Good    Frequency Min 2X/week   Barriers to discharge        Co-evaluation               End of Session Equipment Utilized During Treatment: Oxygen Activity Tolerance: Patient tolerated treatment well Patient left: in bed;with call  bell/phone within reach;with family/visitor present Nurse Communication: Mobility status         Time: [id number] PT Time Calculation (min) (ACUTE ONLY): 22 min   Charges:   PT Evaluation $Initial PT Evaluation Tier I: 1 Procedure     PT G Codes:       Kerin RansomPatrick A McNamara, PT, DPT    05/29/2015, 1:27 PM

## 2015-05-29 NOTE — Progress Notes (Signed)
Extubated without complications to 4lnc 

## 2015-05-29 NOTE — Clinical Social Work Note (Addendum)
Clinical Social Work Assessment  Patient Details  Name: [name] Stuart MRN: [id number] Date of Birth: 05-15-1935  Date of referral:  05/29/15               Reason for consult:   (from [facility])                Permission sought to share information with:  Facility Medical sales representativeContact Representative, Family Supports Permission granted to share information::  Yes, Verbal Permission Granted  Name::      (Husband [name] StableBill [phone])  Agency::     Relationship::     Contact Information:     Housing/Transportation Living arrangements for the past 2 months:  Skilled Building surveyorursing Facility Source of Information:  Patient, Adult Children, Spouse, Medical Team, Facility Patient Interpreter Needed:  None Criminal Activity/Legal Involvement Pertinent to Current Situation/Hospitalization:  No - Comment as needed Significant Relationships:  Adult Children, Spouse Lives with:  Facility Resident Do you feel safe going back to the place where you live?  Yes Need for family participation in patient care:  Yes (Comment)  Care giving concerns:  Concerned that patient's family is giving her inappropriate food.   Social Worker assessment / plan:  Visual merchandiserClinical Social Worker (CSW) consult, patient is from SNF.   Patient was alert, she was not engaged in conversation with CSW as she was recently extubated but did tell CSW to talk with her husband. Patient was pleasant and interjected information, shook her head to answer questions during the conversation with her husband of 38 years (Information provided by husband).  CSW introduced self and explained role of CSW department Patient [facility], per husband has lived there on and off for the past two years, the plan is for patient to return at discharge.     Patient's support system is her husband and daughter.  Patient's baseline is using a lift to go from bed to wheelchair at [facility], she was also able to feed herself.   Husband informed CSW he took  patient home for a visit.  He did give her cream potatoes, however when she choked they saw peas, not sure where she got peas as they did not have any.  Thought patient may have eaten them at [facility].  Husband states patient will need a swallow evaluation.    Family did not pay for a bed hold but would like patient to return to [facility] at discharge.  CSW will complete FL2,  in anticipation of patient returning to [facility] at discharge.  Employment status:  Disabled (Comment on whether or not currently receiving Disability) Insurance information:  Teacher, English as a foreign languageManaged Medicare, [place] Recommendations:  Not assessed at this time Information / Referral to community resources:  Skilled Nursing Facility  Patient/Family's Response to care:  Patient was appreciative of information provided by CSW.  Patient/Family's Understanding of and Emotional Response to Diagnosis, Current Treatment, and Prognosis:  Patient and husband understands that she is under continued medical work up at this time.  Once medically stable patient will discharge back to [facility].  Emotional Assessment Appearance:  Appears stated age Attitude/Demeanor/Rapport:    Affect (typically observed):  Accepting, Pleasant, Calm Orientation:  Oriented to Self, Oriented to Place Alcohol / Substance use:  Other (hx of smoking) Psych involvement (Current and /or in the community):  No (Comment)  Discharge Needs  Concerns to be addressed:  Care Coordination, Discharge Planning Concerns Readmission within the last 30 days:  No Current discharge risk:  Chronically ill, Dependent with Mobility Barriers to Discharge:  Continued Medical Work up   Christian Mate 05/29/2015, 12:59 PM

## 2015-05-29 NOTE — Consult Note (Signed)
PULMONARY / CRITICAL CARE MEDICINE   Name: [name] Stuart MRN: [id number] DOB: March 09, 1935    ADMISSION DATE:  05/27/2015 CONSULTATION DATE: 11/23  REFERRING MD : Dietrich Pates  PT PROFILE: 4F NH resident X several years after R hemispheric CVA with prior aspiration PNAs admitted via ED with acute respiratory distress and hypoxemia. CT chest reveals patchy infiltrates and "debris" in R main bronchus. Intubated 11/23 for FOB  HISTORY OF PRESENT ILLNESS:   80 y.o. female with a known history of cerebrovascular accident, with paralysis living in nursing home for last 2 years, using oxygen as needed basis and not able to walk but still able to feed herself with her arms, also history of the hypercholesterolemia, anxiety and bipolar disorder, esophageal reflux-was taken by husband yesterday to home for the holidays when she was at her baseline condition. She also uses albuterol nebulizers so husband gave her nebulizer treatment 2-3 times overnight but around 2 or 3 in the nighttime he noted that the patient is getting more and more short of breath in spite of using albuterol treatment and she felt very warm so he brought her to emergency room in the morning., In ER patient is noted to have right upper lobe pneumonia with questionable nodularity, hypoxic required BiPAP , high lactic acid, low calcium level, slight hypotension and tachycardia - and as per husband she is also more confused than her baseline - so she is given for admission for sepsis and pneumonia.   SUBJECTIVE: More Awake this morning, failed SBT yesterday (tachypnea/tachycardia, low Vt), following simple commands, will give another SBT today and plan to extubate.   PAST MEDICAL HISTORY :  She  has a past medical history of Other specified rehabilitation procedure(V57.89); Unspecified constipation; Esophageal reflux; Chronic airway obstruction, not elsewhere classified; Other specified disorder of skin; Other symptoms involving skin  and integumentary tissues; Difficulty in walking(719.7); Muscle weakness (generalized); Dysphagia, oropharyngeal phase; Cognitive communication deficit; Anxiety state, unspecified; Unspecified essential hypertension; Unspecified late effects of cerebrovascular disease; Depressive disorder, not elsewhere classified; Other and unspecified hyperlipidemia; Restless legs syndrome (RLS); Other chronic pain; Insomnia, unspecified; Pressure ulcer, buttock(707.05); Unspecified episodic mood disorder; and Stroke (HCC).  PAST SURGICAL HISTORY: She  has past surgical history that includes Cholecystectomy and Bony pelvis surgery.  Allergies  Allergen Reactions  . Aspirin   . Codeine Sulfate   . Ibuprofen   . Penicillins     No current facility-administered medications on file prior to encounter.   Current Outpatient Prescriptions on File Prior to Encounter  Medication Sig  . acetaminophen (TYLENOL) 325 MG tablet Take 650 mg by mouth every 4 (four) hours as needed for mild pain, moderate pain or fever.   . ALPRAZolam (XANAX) 0.25 MG tablet Take 0.25 mg by mouth every 6 (six) hours as needed for anxiety.   Marland Kitchen amLODipine (NORVASC) 2.5 MG tablet Take 2.5 mg by mouth daily.  . clopidogrel (PLAVIX) 75 MG tablet Take 75 mg by mouth daily.   . miconazole (MICOTIN) 2 % cream Apply 1 application topically 2 (two) times daily. To buttocks and sacrum  . omeprazole (PRILOSEC) 20 MG capsule Take 20 mg by mouth every morning.   . polyethylene glycol (MIRALAX / GLYCOLAX) packet Take 17 g by mouth daily as needed for mild constipation, moderate constipation or severe constipation.   . pravastatin (PRAVACHOL) 10 MG tablet Take 10 mg by mouth at bedtime.     FAMILY HISTORY:  Her has no family status information on file.  SOCIAL HISTORY: She  reports that she has been smoking.  She does not have any smokeless tobacco history on file. She reports that she does not drink alcohol.  REVIEW OF SYSTEMS:   HEENT - no eye  discharge, no eye pain, no headaches, no neck pain CVS - no chest pain, no palpitations RESP - no cough, + mild sob ABD - no abd pain, no distension EXT - no joint pain    VITAL SIGNS: BP 153/83 mmHg  Pulse 88  Temp(Src) 97.3 F (36.3 C) (Core (Comment))  Resp 18  Ht 5\' 6"  (1.676 m)  Wt 175 lb 4.3 oz (79.5 kg)  BMI 28.30 kg/m2  SpO2 97%  HEMODYNAMICS:    VENTILATOR SETTINGS: Vent Mode:  [-] PRVC FiO2 (%):  [35 %] 35 % Set Rate:  [14 bmp] 14 bmp Vt Set:  [500 mL] 500 mL PEEP:  [5 cmH20] 5 cmH20  INTAKE / OUTPUT: I/O last 3 completed shifts: In: 3343.3 [I.V.:1893.3; Other:600; IV Piggyback:850] Out: 875 [Urine:875]  PHYSICAL EXAMINATION: General: RASS 0, + F/C Neuro: Mild weakness on L HEENT: edentulous, NCAT Cardiovascular: nl rate, reg, no M Lungs: good air movement throughout, mild dec right lung sounds Abdomen: obese, soft, NT, +BS Ext: warm, R>L LE edema  LABS:  CBC  Recent Labs Lab 05/27/15 0651 05/28/15 0458 05/29/15 0625  WBC 18.0* 14.6* 9.7  HGB 14.2 11.6* 10.8*  HCT 43.0 35.0 33.5*  PLT 325 256 266   Coag's No results for input(s): APTT, INR in the last 168 hours. BMET  Recent Labs Lab 05/27/15 0807 05/28/15 0458 05/29/15 0625  NA 126* 133* 136  K 3.2* 5.0 3.9  CL 98* 104 104  CO2 20* 26 24  BUN 8 11 10   CREATININE 0.62 0.66 0.50  GLUCOSE 577* 136* 93   Electrolytes  Recent Labs Lab 05/27/15 0807 05/28/15 0458 05/29/15 0625  CALCIUM 5.8* 8.3* 8.3*  MG 1.2* 2.0  --   PHOS 2.4*  --   --    Sepsis Markers  Recent Labs Lab 05/27/15 0652 05/27/15 0945  LATICACIDVEN 4.4* 2.3*   ABG No results for input(s): PHART, PCO2ART, PO2ART in the last 168 hours. Liver Enzymes  Recent Labs Lab 05/27/15 0807 05/28/15 0458  AST 17 13*  ALT 6* 8*  ALKPHOS 102 113  BILITOT 2.0* 0.6  ALBUMIN 2.1* 2.7*   Cardiac Enzymes No results for input(s): TROPONINI, PROBNP in the last 168 hours. Glucose  Recent Labs Lab  05/28/15 1137 05/28/15 1559 05/28/15 2000 05/28/15 2351 05/29/15 0438 05/29/15 0742  GLUCAP 100* 97 99 101* 81 83    Imaging No results found.   CULTURES: Blood 11/23 >>  BAL 11/23 >>   ANTIBIOTICS: Vanc 11/23 >>  Meropenem 11/23 >>   SIGNIFICANT EVENTS: 11/23 electively intubated for FOB  LINES/TUBES: ETT 11/23 >>   DISCUSSION: "debris" in R main bronchus does not have appearance of mucus. It is likely either pill or food matter. Less likely endobronchial tumor  ASSESSMENT / PLAN:  PULMONARY A: Acute respiratory failure H/O recurrent aspirations R mainstem bronchus lesion - suspect aspirated foreign body/material P:   Vent settings established - PSV/SBT trial today, with possible extubation Vent bundle implemented Daily SBT as indicated   CARDIOVASCULAR A:  No issues P:  Monitor hemodynamics  RENAL A:   Hypokalemia - improving P:   Monitor BMET intermittently Monitor I/Os Correct electrolytes as indicated IVFs adjusted 11/23  GASTROINTESTINAL A:   Suspected dysphagia P:  SUP: IV famotidine Consider SLP eval before oral nutrition  HEMATOLOGIC A:   Asymmetric LE edema P:  DVT px: Enoxaparin Monitor CBC intermittently Transfuse per usual ICU guidelines LE venous Dopplers ordered 11/23  INFECTIOUS A:   Aspiration PNA P:   Monitor temp, WBC count Micro and abx as above  ENDOCRINE A:   Severe hyperglycemia without prior dx of DM   P:   SSI ordered  NEUROLOGIC A:   Prior CVA Chronic debilitation Chronic depression P:   RASS goal: -1, -2 PAD protocol   I have personally obtained a history, examined the patient, evaluated laboratory and imaging results, formulated the assessment and plan and placed orders. CRITICAL CARE: The patient is critically ill with multiple organ systems failure and requires high complexity decision making for assessment and support, frequent evaluation and titration of therapies, application of  advanced monitoring technologies and extensive interpretation of multiple databases.   Critical Care Time devoted to patient care services described in this note is 35 minutes.    Stephanie Acre, MD [facility] Pager [phone] (please enter 7-digits) On Call Pager - [phone] (please enter 7-digits)  05/29/2015, 8:47 AM

## 2015-05-29 NOTE — Progress Notes (Signed)
Called to see pt for laboured breathing and intubation around 6;45.  Pt is extubated earlier,  Exam- Drowsy.  laboured breathing- rate 20-24  wheezing present.   E ICU doctor suggested intubation.  Plan: Ac respi failure    Intubation performed By respi therapist . It was a difficult intubation.  Started on vent support.  Manage per primary team.  Additional critical care time spent 20 min.

## 2015-05-29 NOTE — Progress Notes (Signed)
Update: Patient with wheezing and coarse upper airway sounds. Awake and mentating well, sats>88%, dec BS at the bases. SBP >160, on multiple rechecks Extubated this morning.  Plan - duonebs scheduled - mucomyst scheduled - chest physiotherapy - BP control   Stephanie AcreVishal Jameis Newsham, MD [facility] Pager [phone]- [phone] (please enter 7-digits) On Call Pager - [phone] (please enter 7-digits)

## 2015-05-29 NOTE — Progress Notes (Signed)
ANTIBIOTIC CONSULT NOTE - INITIAL  Pharmacy Consult for Unasyn  Indication: pneumonia  Allergies  Allergen Reactions  . Aspirin   . Codeine Sulfate   . Ibuprofen   . Penicillins     Patient Measurements: Height: 5\' 6"  (167.6 cm) Weight: 175 lb 4.3 oz (79.5 kg) IBW/kg (Calculated) : 59.3   Vital Signs: Temp: 97.3 F (36.3 C) (11/25 0700) Temp Source: Core (Comment) (11/25 0400) BP: 153/83 mmHg (11/25 0700) Pulse Rate: 88 (11/25 0700)  Labs:  Recent Labs  05/27/15 0651 05/27/15 0807 05/28/15 0458 05/29/15 0625  WBC 18.0*  --  14.6* 9.7  HGB 14.2  --  11.6* 10.8*  PLT 325  --  256 266  CREATININE  --  0.62 0.66 0.50   Estimated Creatinine Clearance: 61.7 mL/min (by C-G formula based on Cr of 0.5). No results for input(s): VANCOTROUGH, VANCOPEAK, VANCORANDOM, GENTTROUGH, GENTPEAK, GENTRANDOM, TOBRATROUGH, TOBRAPEAK, TOBRARND, AMIKACINPEAK, AMIKACINTROU, AMIKACIN in the last 72 hours.   Microbiology: Recent Results (from the past 720 hour(s))  Blood culture (routine x 2)     Status: None (Preliminary result)   Collection Time: 05/27/15  6:51 AM  Result Value Ref Range Status   Specimen Description BLOOD  Final   Special Requests NONE  Final   Culture NO GROWTH 2 DAYS  Final   Report Status PENDING  Incomplete  Urine culture     Status: None (Preliminary result)   Collection Time: 05/27/15  6:51 AM  Result Value Ref Range Status   Specimen Description URINE, CLEAN CATCH  Final   Special Requests NONE  Final   Culture NO GROWTH < 24 HOURS  Final   Report Status PENDING  Incomplete  Blood culture (routine x 2)     Status: None (Preliminary result)   Collection Time: 05/27/15  6:52 AM  Result Value Ref Range Status   Specimen Description BLOOD RIGHT HAND  Final   Special Requests BOTTLES DRAWN AEROBIC AND ANAEROBIC  2CC  Final   Culture NO GROWTH 2 DAYS  Final   Report Status PENDING  Incomplete  MRSA PCR Screening     Status: None   Collection Time: 05/27/15  12:27 PM  Result Value Ref Range Status   MRSA by PCR NEGATIVE NEGATIVE Final    Comment:        The GeneXpert MRSA Assay (FDA approved for NASAL specimens only), is one component of a comprehensive MRSA colonization surveillance program. It is not intended to diagnose MRSA infection nor to guide or monitor treatment for MRSA infections.     Assessment: 80 yo female being treated for aspiration PNA. Pharmacy consulted for Unasyn dosing and monitoring. Patient was started on meropenem and vanc on admission 11/23, Vancomycin was D'cd 11/24, meropenem D'cd during AM rounds.   WBC: 9.7 (down from 14.6), Scr: 0.5, CrCl 61.7, patient is afebrile  Plan:  Will start patient on Unasyn 3g q6H. Pharmacy will continue to monitor renal function and labs and make adjustments as needed.   Cher NakaiSheema Fonda Rochon, PharmD Pharmacy Resident 05/29/2015

## 2015-05-30 DIAGNOSIS — I5031 Acute diastolic (congestive) heart failure: Secondary | ICD-10-CM

## 2015-05-30 LAB — COMPREHENSIVE METABOLIC PANEL WITH GFR
ALT: 8 U/L — ABNORMAL LOW (ref 14–54)
AST: 13 U/L — ABNORMAL LOW (ref 15–41)
Albumin: 2.8 g/dL — ABNORMAL LOW (ref 3.5–5.0)
Alkaline Phosphatase: 120 U/L (ref 38–126)
Anion gap: 10 (ref 5–15)
BUN: 11 mg/dL (ref 6–20)
CO2: 24 mmol/L (ref 22–32)
Calcium: 8.5 mg/dL — ABNORMAL LOW (ref 8.9–10.3)
Chloride: 104 mmol/L (ref 101–111)
Creatinine, Ser: 0.54 mg/dL (ref 0.44–1.00)
GFR calc Af Amer: >60 mL/min
GFR calc non Af Amer: >60 mL/min
Glucose, Bld: 105 mg/dL — ABNORMAL HIGH (ref 65–99)
Potassium: 4.2 mmol/L (ref 3.5–5.1)
Sodium: 138 mmol/L (ref 135–145)
Total Bilirubin: 0.6 mg/dL (ref 0.3–1.2)
Total Protein: 5.8 g/dL — ABNORMAL LOW (ref 6.5–8.1)

## 2015-05-30 LAB — EXPECTORATED SPUTUM ASSESSMENT W REFEX TO RESP CULTURE

## 2015-05-30 LAB — URINE CULTURE: Culture: NO GROWTH

## 2015-05-30 LAB — EXPECTORATED SPUTUM ASSESSMENT W GRAM STAIN, RFLX TO RESP C: Special Requests: NORMAL

## 2015-05-30 LAB — GLUCOSE, CAPILLARY
GLUCOSE-CAPILLARY: 129 mg/dL — AB (ref 65–99)
GLUCOSE-CAPILLARY: 111 mg/dL — AB (ref 65–99)
Glucose-Capillary: 112 mg/dL — ABNORMAL HIGH (ref 65–99)
Glucose-Capillary: 95 mg/dL (ref 65–99)
Glucose-Capillary: 104 mg/dL — ABNORMAL HIGH (ref 65–99)

## 2015-05-30 LAB — MAGNESIUM: Magnesium: 2.2 mg/dL (ref 1.7–2.4)

## 2015-05-30 MED ORDER — MIDAZOLAM HCL 2 MG/2ML IJ SOLN
1.0000 mg | INTRAMUSCULAR | Status: DC | PRN
  Administered 2015-05-30: 1 mg via INTRAVENOUS

## 2015-05-30 MED ORDER — MIDAZOLAM HCL 2 MG/2ML IJ SOLN: 1.0000 mg | INTRAMUSCULAR | Status: DC | PRN

## 2015-05-30 MED ORDER — PREDNISONE 20 MG PO TABS
40.0000 mg | ORAL_TABLET | Freq: Every day | ORAL | Status: AC
  Administered 2015-05-31 – 2015-06-02 (×3): 40 mg via ORAL
  Filled 2015-05-30 (×3): qty 2

## 2015-05-30 MED ORDER — FREE WATER
200.0000 mL | Freq: Three times a day (TID) | Status: DC
  Administered 2015-05-30 – 2015-06-04 (×15): 200 mL

## 2015-05-30 MED ORDER — VITAL HIGH PROTEIN PO LIQD
1000.0000 mL | ORAL | Status: DC
  Administered 2015-05-30 (×4)
  Administered 2015-05-31 (×2): 1000 mL
  Administered 2015-05-31: 15:00:00
  Administered 2015-05-31: 1000 mL
  Administered 2015-06-01 (×2)
  Administered 2015-06-01 – 2015-06-02 (×3): 1000 mL
  Administered 2015-06-02 (×3)
  Administered 2015-06-03: 1000 mL

## 2015-05-30 NOTE — Progress Notes (Signed)
This RN updated Black Box RN/Jill of pt urine output of 250 since 0600, MD to be updated, no new orders received, nursing will cont to monitor

## 2015-05-30 NOTE — Progress Notes (Signed)
   05/30/15 1331  Clinical Encounter Type  Visited With Patient;Family  Visit Type Initial  Referral From Family  Consult/Referral To Chaplain  Stress Factors  Family Stress Factors Lack of knowledge  Chaplain spoke to patients husband and offered pastoral care and a compassionate presence. I then visited with patient and offered a compassionate presence.   Fisher ScientificChaplain Jabez Molner [phone]

## 2015-05-30 NOTE — Progress Notes (Signed)
Gastric Res: 0 

## 2015-05-30 NOTE — Consult Note (Signed)
PULMONARY / CRITICAL CARE MEDICINE   Name: [name] Stuart MRN: [id number] DOB: [birth year]    ADMISSION DATE:  05/27/2015 CONSULTATION DATE: 11/23  REFERRING MD : Dietrich PatesVachhani, [place]  PT PROFILE: [id number] NH resident X several years after R hemispheric CVA with prior aspiration PNAs admitted via ED with acute respiratory distress and hypoxemia. CT chest reveals patchy infiltrates and "debris" in R main bronchus. Intubated 11/23 for FOB  HISTORY OF PRESENT ILLNESS:   80 y.o. female with a known history of cerebrovascular accident, with paralysis living in nursing home for last 2 years, using oxygen as needed basis and not able to walk but still able to feed herself with her arms, also history of the hypercholesterolemia, anxiety and bipolar disorder, esophageal reflux-was taken by husband yesterday to home for the holidays when she was at her baseline condition. She also uses albuterol nebulizers so husband gave her nebulizer treatment 2-3 times overnight but around 2 or 3 in the nighttime he noted that the patient is getting more and more short of breath in spite of using albuterol treatment and she felt very warm so he brought her to emergency room in the morning., In ER patient is noted to have right upper lobe pneumonia with questionable nodularity, hypoxic required BiPAP , high lactic acid, low calcium level, slight hypotension and tachycardia - and as per husband she is also more confused than her baseline - so she is given for admission for sepsis and pneumonia.   SUBJECTIVE: Overnight had respiratory failure after extubation during the day, noted to have moderate thick gurgling upper airway breath sounds,  reintubated urgently and placed back on mechanical ventilator. This morning on mechanical ventilator awake following commands.    PAST MEDICAL HISTORY :  She  has a past medical history of Other specified rehabilitation procedure(V57.89); Unspecified constipation; Esophageal reflux; Chronic  airway obstruction, not elsewhere classified; Other specified disorder of skin; Other symptoms involving skin and integumentary tissues; Difficulty in walking(719.7); Muscle weakness (generalized); Dysphagia, oropharyngeal phase; Cognitive communication deficit; Anxiety state, unspecified; Unspecified essential hypertension; Unspecified late effects of cerebrovascular disease; Depressive disorder, not elsewhere classified; Other and unspecified hyperlipidemia; Restless legs syndrome (RLS); Other chronic pain; Insomnia, unspecified; Pressure ulcer, buttock(707.05); Unspecified episodic mood disorder; and Stroke (HCC).  PAST SURGICAL HISTORY: She  has past surgical history that includes Cholecystectomy and Bony pelvis surgery.  Allergies  Allergen Reactions   Aspirin    Codeine Sulfate    Ibuprofen    Penicillins     No current facility-administered medications on file prior to encounter.   Current Outpatient Prescriptions on File Prior to Encounter  Medication Sig   acetaminophen (TYLENOL) 325 MG tablet Take 650 mg by mouth every 4 (four) hours as needed for mild pain, moderate pain or fever.    ALPRAZolam (XANAX) 0.25 MG tablet Take 0.25 mg by mouth every 6 (six) hours as needed for anxiety.    amLODipine (NORVASC) 2.5 MG tablet Take 2.5 mg by mouth daily.   clopidogrel (PLAVIX) 75 MG tablet Take 75 mg by mouth daily.    miconazole (MICOTIN) 2 % cream Apply 1 application topically 2 (two) times daily. To buttocks and sacrum   omeprazole (PRILOSEC) 20 MG capsule Take 20 mg by mouth every morning.    polyethylene glycol (MIRALAX / GLYCOLAX) packet Take 17 g by mouth daily as needed for mild constipation, moderate constipation or severe constipation.    pravastatin (PRAVACHOL) 10 MG tablet Take 10 mg by mouth at bedtime.  FAMILY HISTORY:  Her has no family status information on file.   SOCIAL HISTORY: She  reports that she has been smoking.  She does not have any smokeless  tobacco history on file. She reports that she does not drink alcohol.  REVIEW OF SYSTEMS:   HEENT - no eye discharge, no eye pain, no headaches, no neck pain CVS - no chest pain, no palpitations RESP - no cough, + mild sob ABD - no abd pain, no distension EXT - no joint pain    VITAL SIGNS: BP 119/57 mmHg   Pulse 62   Temp(Src) 97.5 F (36.4 C) (Axillary)   Resp 15   Ht  (1.676 m)   Wt 175 lb 4.3 oz (79.5 kg)   BMI 28.30 kg/m2   SpO2 96%  HEMODYNAMICS:    VENTILATOR SETTINGS: Vent Mode:  [-] PRVC FiO2 (%):  [28 %-35 %] 28 % Set Rate:  [12 bmp] 12 bmp Vt Set:  [480 mL] 480 mL PEEP:  [5 cmH20] 5 cmH20  INTAKE / OUTPUT: I/O last 3 completed shifts: In: 2582.8 [I.V.:1772.8; NG/GT:60; IV Piggyback:750] Out: 2265 [Urine:2265]  PHYSICAL EXAMINATION: General: RASS 0, + F/C Neuro: Mild weakness on L HEENT: edentulous, NCAT Cardiovascular: nl rate, reg, no M Lungs: good air movement throughout, mild dec right lung sounds Abdomen: obese, soft, NT, +BS Ext: warm, R>L LE edema  LABS:  CBC  Recent Labs Lab 05/27/15 0651 05/28/15 0458 05/29/15 0625  WBC 18.0* 14.6* 9.7  HGB 14.2 11.6* 10.8*  HCT 43.0 35.0 33.5*  PLT 325 256 266   Coag's No results for input(s): APTT, INR in the last 168 hours. BMET  Recent Labs Lab 05/27/15 0807 05/28/15 0458 05/29/15 0625  NA 126* 133* 136  K 3.2* 5.0 3.9  CL 98* 104 104  CO2 20* 26 24  BUN CREATININE 0.62 0.66 0.50  GLUCOSE 577* 136* 93   Electrolytes  Recent Labs Lab 05/27/15 0807 05/28/15 0458 05/29/15 0625  CALCIUM 5.8* 8.3* 8.3*  MG 1.2* 2.0  --   PHOS 2.4*  --   --    Sepsis Markers  Recent Labs Lab 05/27/15 0652 05/27/15 0945  LATICACIDVEN 4.4* 2.3*   ABG  Recent Labs Lab 05/29/15 2130  PHART 7.36  PCO2ART 46  PO2ART 68*   Liver Enzymes  Recent Labs Lab 05/27/15 0807 05/28/15 0458  AST 17 13*  ALT 6* 8*  ALKPHOS 102 113  BILITOT 2.0* 0.6  ALBUMIN 2.1* 2.7*   Cardiac  Enzymes No results for input(s): TROPONINI, PROBNP in the last 168 hours. Glucose  Recent Labs Lab 05/29/15 0742 05/29/15 1816 05/29/15 2032 05/29/15 2328 05/30/15 0415 05/30/15 0705  GLUCAP 83 119* 137* 122* 129* 112*    Imaging Dg Chest 1 View  05/29/2015  CLINICAL DATA:  Intubation. EXAM: CHEST 1 VIEW COMPARISON:  05/28/2015 FINDINGS: Patient slightly rotated to the right. Endotracheal tube has tip 1.8 cm above the carina. Lungs are adequately inflated demonstrate bibasilar opacification left worse than right likely small effusions with associated atelectasis. Cannot exclude infection in the lung bases. Cardiomediastinal silhouette and remainder of the exam is unchanged. IMPRESSION: Bibasilar opacification left greater than right likely small effusions with atelectasis. Cannot exclude infection in the lung bases. Endotracheal tube with tip 1.8 cm above the carina. Electronically Signed   By: Elberta Fortis M.D.   On: 05/29/2015 19:54   Dg Abd 1 View  05/29/2015  CLINICAL DATA:  OG tube placement  EXAM: ABDOMEN - 1 VIEW COMPARISON:  Radiograph 05/27/2015 FINDINGS: NG tube extends stomach.  Tip is in the gastric antrum. IMPRESSION: NG tube with tip in the gastric antrum. Electronically Signed   By: Genevive Bi M.D.   On: 05/29/2015 21:07     CULTURES: Blood 11/23 >>  BAL 11/23 >>   ANTIBIOTICS: Vanc 11/23 >> 11/25 Meropenem 11/23 >> 11/25 Unasyn 11/25>>  SIGNIFICANT EVENTS: 11/23 electively intubated for FOB 11/25>>extubated>>reintubated 2/2 to resp distress and secretions  LINES/TUBES: ETT 11/23 >> 11/25 ETT 11/25>>  DISCUSSION: "debris" in R main bronchus does not have appearance of mucus. It is likely either pill or food matter. Less likely endobronchial tumor  ASSESSMENT / PLAN:  PULMONARY A: Acute respiratory failure H/O recurrent aspirations R mainstem bronchus lesion - suspect aspirated foreign body/material P:   Vent settings established - PSV, failed  extubation on 11/25. Plan to keep on vent for today, cont with steroids, nebs and abx. Prednisone  for another 3 days, then taper over 2 weeks Vent bundle implemented Daily SBT as indicated   CARDIOVASCULAR A:  No issues P:  Monitor hemodynamics  RENAL A:   Hypokalemia - improving P:   Monitor BMET intermittently Monitor I/Os Correct electrolytes as indicated IVFs adjusted 11/23  GASTROINTESTINAL A:   Suspected dysphagia P:   SUP: IV famotidine Consider SLP eval before oral nutrition  HEMATOLOGIC A:   Asymmetric LE edema P:  DVT px: Enoxaparin Monitor CBC intermittently Transfuse per usual ICU guidelines LE venous Dopplers ordered 11/23  INFECTIOUS A:   Aspiration PNA P:   Monitor temp, WBC count Micro and abx as above  ENDOCRINE A:   Severe hyperglycemia without prior dx of DM   P:   SSI ordered  NEUROLOGIC A:   Prior CVA Chronic debilitation Chronic depression P:   RASS goal: -1, 0 PAD protocol Avoid over sedation  PSYCH Hx of Bipolar - resides at [facility], may need to address psych/sedative meds after extubation, given high aspiration risk.   I have personally obtained a history, examined the patient, evaluated laboratory and imaging results, formulated the assessment and plan and placed orders. CRITICAL CARE: The patient is critically ill with multiple organ systems failure and requires high complexity decision making for assessment and support, frequent evaluation and titration of therapies, application of advanced monitoring technologies and extensive interpretation of multiple databases.   Critical Care Time devoted to patient care services described in this note is 40 minutes.    Stephanie Acre, MD [facility] Pager [phone] (please enter 7-digits) On Call Pager - [phone] (please enter 7-digits)  05/30/2015, 9:24 AM

## 2015-05-30 NOTE — Progress Notes (Addendum)
Pt HR 50- 60 with irregular beats,with PVCs, and couplet PVCs pt OBS sleeping, pt easily arousable, pt without complaints, Black Box MD made aware no new orders received, nursing will cont to monitor

## 2015-05-30 NOTE — Progress Notes (Signed)
Patient ID: [name] Stuart, female   DOB: 07-Jun-1935, 80 y.o.   MRN: [id number] [facility] PROGRESS NOTE  PCP: [facility]  HPI/Subjective: Patient having lots of secretions. Nurse having to suction every hour. Patient was reintubated yesterday evening.  Objective: Filed Vitals:   05/30/15 1200 05/30/15 1300  BP: 138/64 134/78  Pulse: 73 75  Temp: 97.9 F (36.6 C)   Resp: 21     Filed Weights   05/27/15 0702 05/27/15 1214  Weight: 68.04 kg (150 lb) 79.5 kg (175 lb 4.3 oz)    ROS: Review of Systems  Unable to perform ROS  Limited review of systems secondary to patient being on the ventilator  Exam: Physical Exam  HENT:  Nose: No mucosal edema.  Mouth/Throat: No oropharyngeal exudate or posterior oropharyngeal edema.  Eyes: Conjunctivae and lids are normal. Pupils are equal, round, and reactive to light.  Neck: No JVD present. Carotid bruit is not present. No edema present. No thyroid mass and no thyromegaly present.  Cardiovascular: S1 normal and S2 normal.  Exam reveals no gallop.   No murmur heard. Pulses:      Dorsalis pedis pulses are 2+ on the right side, and 2+ on the left side.  Respiratory: No accessory muscle usage. No respiratory distress. She has decreased breath sounds in the right lower field and the left lower field. She has no wheezes. She has rhonchi in the right lower field and the left lower field. She has no rales.  GI: Soft. Bowel sounds are normal. There is no tenderness.  Musculoskeletal:       Right ankle: She exhibits swelling.       Left ankle: She exhibits swelling.  Lymphadenopathy:    She has no cervical adenopathy.  Neurological: She is alert.  Skin: Skin is warm. Nails show no clubbing.  Chronic lower extremity discoloration  Psychiatric: She has a normal mood and affect.    Data Reviewed: Basic Metabolic Panel:  Recent Labs Lab 05/27/15 0807 05/28/15 0458 05/29/15 0625 05/30/15 1151  NA 126* 133*  136 138  K 3.2* 5.0 3.9 4.2  CL 98* 104 104 104  CO2 20* GLUCOSE 577* 136* 93 105*  BUN CREATININE 0.62 0.66 0.50 0.54  CALCIUM 5.8* 8.3* 8.3* 8.5*  MG 1.2* 2.0  --   --   PHOS 2.4*  --   --   --    Liver Function Tests:  Recent Labs Lab 05/27/15 0807 05/28/15 0458 05/30/15 1151  AST 17 13* 13*  ALT 6* 8* 8*  ALKPHOS 102 113 120  BILITOT 2.0* 0.6 0.6  PROT 4.7* 5.7* 5.8*  ALBUMIN 2.1* 2.7* 2.8*   CBC:  Recent Labs Lab 05/27/15 0651 05/28/15 0458 05/29/15 0625  WBC 18.0* 14.6* 9.7  HGB 14.2 11.6* 10.8*  HCT 43.0 35.0 33.5*  MCV 90.7 90.5 90.8  PLT 325 256 266    CBG:  Recent Labs Lab 05/29/15 2032 05/29/15 2328 05/30/15 0415 05/30/15 0705 05/30/15 1113  GLUCAP 137* 122* 129* 112* 111*    Recent Results (from the past 240 hour(s))  Blood culture (routine x 2)     Status: None (Preliminary result)   Collection Time: 05/27/15  6:51 AM  Result Value Ref Range Status   Specimen Description BLOOD  Final   Special Requests NONE  Final   Culture NO GROWTH 3 DAYS  Final   Report Status PENDING  Incomplete  Urine  culture     Status: None   Collection Time: 05/27/15  6:51 AM  Result Value Ref Range Status   Specimen Description URINE, CLEAN CATCH  Final   Special Requests NONE  Final   Culture NO GROWTH 2 DAYS  Final   Report Status 05/30/2015 FINAL  Final  Blood culture (routine x 2)     Status: None (Preliminary result)   Collection Time: 05/27/15  6:52 AM  Result Value Ref Range Status   Specimen Description BLOOD RIGHT HAND  Final   Special Requests BOTTLES DRAWN AEROBIC AND ANAEROBIC  2CC  Final   Culture NO GROWTH 3 DAYS  Final   Report Status PENDING  Incomplete  MRSA PCR Screening     Status: None   Collection Time: 05/27/15 12:27 PM  Result Value Ref Range Status   MRSA by PCR NEGATIVE NEGATIVE Final    Comment:        The GeneXpert MRSA Assay (FDA approved for NASAL specimens only), is one component of  a comprehensive MRSA colonization surveillance program. It is not intended to diagnose MRSA infection nor to guide or monitor treatment for MRSA infections.      Studies: Dg Chest 1 View  05/29/2015  CLINICAL DATA:  Intubation. EXAM: CHEST 1 VIEW COMPARISON:  05/28/2015 FINDINGS: Patient slightly rotated to the right. Endotracheal tube has tip 1.8 cm above the carina. Lungs are adequately inflated demonstrate bibasilar opacification left worse than right likely small effusions with associated atelectasis. Cannot exclude infection in the lung bases. Cardiomediastinal silhouette and remainder of the exam is unchanged. IMPRESSION: Bibasilar opacification left greater than right likely small effusions with atelectasis. Cannot exclude infection in the lung bases. Endotracheal tube with tip 1.8 cm above the carina. Electronically Signed   By: Elberta Fortisaniel  Boyle M.D.   On: 05/29/2015 19:54   Dg Abd 1 View  05/29/2015  CLINICAL DATA:  OG tube placement EXAM: ABDOMEN - 1 VIEW COMPARISON:  Radiograph 05/27/2015 FINDINGS: NG tube extends stomach.  Tip is in the gastric antrum. IMPRESSION: NG tube with tip in the gastric antrum. Electronically Signed   By: Genevive BiStewart  Edmunds M.D.   On: 05/29/2015 21:07    Scheduled Meds: . amLODipine  5 mg Oral Daily  . ampicillin-sulbactam (UNASYN) IV  3 g Intravenous Q6H  . antiseptic oral rinse  7 mL Mouth Rinse QID  . budesonide (PULMICORT) nebulizer solution  0.25 mg Nebulization BID  . chlorhexidine gluconate  15 mL Mouth Rinse BID  . clopidogrel  75 mg Per Tube Daily  . enoxaparin (LOVENOX) injection  40 mg Subcutaneous Q24H  . famotidine (PEPCID) IV  20 mg Intravenous Q12H  . feeding supplement (VITAL HIGH PROTEIN)  1,000 mL Per Tube Q24H  . fluticasone  2 spray Each Nare Daily  . free water  200 mL Per Tube 3 times per day  . hydrALAZINE  20 mg Intravenous Once  . insulin aspart  0-15 Units Subcutaneous 6 times per day  . ipratropium-albuterol  3 mL  Nebulization Q6H  . miconazole  1 application Topical BID  . OLANZapine  2.5 mg Per Tube BID  . pravastatin  10 mg Per Tube QHS  . [START ON 05/31/2015] predniSONE  40 mg Oral Q breakfast  . rOPINIRole  0.75 mg Per Tube QPM  . traZODone  150 mg Per Tube QHS  . valproic acid  250 mg Oral TID  . [START ON 06/25/2015] Vitamin D (Ergocalciferol)  50,000 Units Oral Q30 days  Continuous Infusions: . sodium chloride 50 mL/hr at 05/30/15 1417    Assessment/Plan:  1. Clinical sepsis, aspiration pneumonia.- Patient is Unasyn. Patient is status post bronchoscopy removing Peas from right main stem bronchus. Send sputum culture. Blood culture and urine culture negative 2. Acute respiratory failure with hypoxia, patient with lots of secretions last night and had to be reintubated. Still with lots of secretions at this time and needs frequent suctioning. Continue ventilator support today and weaning process tomorrow. FiO2 was 28%. 3. Hyperglycemia on presentation- sugars have improved 4. Hypocalcemia on presentation- this is also improved 5. Wheezing in lungs with history of COPD- nebulizer treatments, prednisone 6. History of stroke- on Plavix and pravastatin 7. Dysphasia- a safe diet for this patient will have to be figured out prior to discharge back to facility    Code Status:     Code Status Orders        Start     Ordered   05/27/15 1226  Full code   Continuous     05/27/15 1226    Advance Directive Documentation        Most Recent Value   Type of Advance Directive  Healthcare Power of Attorney, Living will   Pre-existing out of facility DNR order (yellow form or pink MOST form)     "MOST" Form in Place?       Family Communication: Husband at bedside Disposition Plan: Back to [facility] once stable.  Consultants:  Critical care specialist  Procedures:  Bronchoscopy  ET tube placement x2  Antibiotics:  Unasyn   Time spent: 20 minutes   Alford Highland  [facility]

## 2015-05-30 NOTE — Progress Notes (Signed)
Initial Nutrition Assessment     INTERVENTION:  EN: Verbal order to start tube feeding received from MD Mungal this am.  Recommend vital high protein at goal rate of 2860ml/hr.  Will provide 1440 kcals, 126 g of protein and 1209ml free water.    NUTRITION DIAGNOSIS:   Inadequate oral intake related to acute illness as evidenced by NPO status.    GOAL:   Provide needs based on ASPEN/SCCM guidelines    MONITOR:    (Energy intake, Pulmonary profile, Electrolyte and renal profile)  REASON FOR ASSESSMENT:   Consult Enteral/tube feeding initiation and management  ASSESSMENT:      Pt admitted with acute respiratory failure, failed extubation on 11/25, reintubated last pm.     Current Nutrition: NPO  Food/Nutrition-Related History: unable to obtain at this time   Scheduled Medications:  . amLODipine  5 mg Oral Daily  . ampicillin-sulbactam (UNASYN) IV  3 g Intravenous Q6H  . antiseptic oral rinse  7 mL Mouth Rinse QID  . budesonide (PULMICORT) nebulizer solution  0.25 mg Nebulization BID  . chlorhexidine gluconate  15 mL Mouth Rinse BID  . clopidogrel  75 mg Per Tube Daily  . enoxaparin (LOVENOX) injection  40 mg Subcutaneous Q24H  . famotidine (PEPCID) IV  20 mg Intravenous Q12H  . fluticasone  2 spray Each Nare Daily  . hydrALAZINE  20 mg Intravenous Once  . insulin aspart  0-15 Units Subcutaneous 6 times per day  . ipratropium-albuterol  3 mL Nebulization Q6H  . miconazole  1 application Topical BID  . OLANZapine  2.5 mg Per Tube BID  . pravastatin  10 mg Per Tube QHS  . [START ON 05/31/2015] predniSONE  40 mg Oral Q breakfast  . rOPINIRole  0.75 mg Per Tube QPM  . traZODone  150 mg Per Tube QHS  . valproic acid  250 mg Oral TID  . [START ON 06/25/2015] Vitamin D (Ergocalciferol)  50,000 Units Oral Q30 days    Continuous Medications:  . sodium chloride 50 mL/hr at 05/30/15 0713     Electrolyte/Renal Profile and Glucose Profile:   Recent Labs Lab  05/27/15 0807 05/28/15 0458 05/29/15 0625  NA 126* 133* 136  K 3.2* 5.0 3.9  CL 98* 104 104  CO2 20* 26 24  BUN 8 11 10   CREATININE 0.62 0.66 0.50  CALCIUM 5.8* 8.3* 8.3*  MG 1.2* 2.0  --   PHOS 2.4*  --   --   GLUCOSE 577* 136* 93   Protein Profile:  Recent Labs Lab 05/27/15 0807 05/28/15 0458  ALBUMIN 2.1* 2.7*    Gastrointestinal Profile: Last BM: 11/25 soft, small BM   Nutrition-Focused Physical Exam Findings:  Unable to complete Nutrition-Focused physical exam at this time.     Weight Change: wt gain per wt encounters    Diet Order:  Diet NPO time specified  Skin:   reviewed  Height:   Ht Readings from Last 1 Encounters:  05/27/15 5\' 6"  (1.676 m)    Weight:   Wt Readings from Last 1 Encounters:  05/27/15 175 lb 4.3 oz (79.5 kg)    Ideal Body Weight:     BMI:  Body mass index is 28.3 kg/(m^2).  Estimated Nutritional Needs:   Kcal:  TEE 1446 kcals/d (Ve 7.6, T max 37)  Protein:  (1.2-2.0 g/kg) 96-160 g/d  Fluid:  (25-3230ml/kg) 2000-24600ml/d  EDUCATION NEEDS:   No education needs identified at this time  HIGH Care Level  Rendy Lazard B. Freida BusmanAllen,  RD, LDN [phone] (pager)

## 2015-05-30 NOTE — Progress Notes (Signed)
Pt HR 110-120 with activity, pt without complaints and texting on cell phone, after rest pt HR returned to 100-110s per baseline

## 2015-05-30 NOTE — Progress Notes (Signed)
Pt with decreased urine output MD Weiting, no new orders given, nursing to continue to monitor

## 2015-05-30 NOTE — Progress Notes (Signed)
Gastric Res: 0

## 2015-05-30 NOTE — Progress Notes (Signed)
ANTIBIOTIC CONSULT NOTE - INITIAL  Pharmacy Consult for Unasyn  Indication: pneumonia  Allergies  Allergen Reactions  . Aspirin   . Codeine Sulfate   . Ibuprofen   . Penicillins     Patient Measurements: Height: 5\' 6"  (167.6 cm) Weight: 175 lb 4.3 oz (79.5 kg) IBW/kg (Calculated) : 59.3   Vital Signs: Temp: 97.5 F (36.4 C) (11/26 0400) Temp Source: Axillary (11/26 0400) BP: 143/53 mmHg (11/26 1000) Pulse Rate: 73 (11/26 1000)  Labs:  Recent Labs  05/28/15 0458 05/29/15 0625  WBC 14.6* 9.7  HGB 11.6* 10.8*  PLT 256 266  CREATININE 0.66 0.50   Estimated Creatinine Clearance: 61.7 mL/min (by C-G formula based on Cr of 0.5). No results for input(s): VANCOTROUGH, VANCOPEAK, VANCORANDOM, GENTTROUGH, GENTPEAK, GENTRANDOM, TOBRATROUGH, TOBRAPEAK, TOBRARND, AMIKACINPEAK, AMIKACINTROU, AMIKACIN in the last 72 hours.   Microbiology: Recent Results (from the past 720 hour(s))  Blood culture (routine x 2)     Status: None (Preliminary result)   Collection Time: 05/27/15  6:51 AM  Result Value Ref Range Status   Specimen Description BLOOD  Final   Special Requests NONE  Final   Culture NO GROWTH 3 DAYS  Final   Report Status PENDING  Incomplete  Urine culture     Status: None (Preliminary result)   Collection Time: 05/27/15  6:51 AM  Result Value Ref Range Status   Specimen Description URINE, CLEAN CATCH  Final   Special Requests NONE  Final   Culture NO GROWTH < 24 HOURS  Final   Report Status PENDING  Incomplete  Blood culture (routine x 2)     Status: None (Preliminary result)   Collection Time: 05/27/15  6:52 AM  Result Value Ref Range Status   Specimen Description BLOOD RIGHT HAND  Final   Special Requests BOTTLES DRAWN AEROBIC AND ANAEROBIC  2CC  Final   Culture NO GROWTH 3 DAYS  Final   Report Status PENDING  Incomplete  MRSA PCR Screening     Status: None   Collection Time: 05/27/15 12:27 PM  Result Value Ref Range Status   MRSA by PCR NEGATIVE NEGATIVE  Final    Comment:        The GeneXpert MRSA Assay (FDA approved for NASAL specimens only), is one component of a comprehensive MRSA colonization surveillance program. It is not intended to diagnose MRSA infection nor to guide or monitor treatment for MRSA infections.     Assessment: 80 yo female being treated for aspiration PNA. Pharmacy consulted for Unasyn dosing and monitoring. Patient was started on meropenem and vanc on admission 11/23, Vancomycin was D'cd 11/24, meropenem D'cd during AM rounds. Unknown PCN allergy, tolerated meropenem and no ADRs seen with administration of Unasyn.   WBC: 9.7 (down from 14.6), Scr: 0.5, CrCl 61.7, patient is afebrile  Plan:  Will continue patient on Unasyn 3g q6H.  Pharmacy will continue to monitor renal function and labs and make adjustments as needed.   Cher NakaiSheema Thales Knipple, PharmD Pharmacy Resident 05/30/2015

## 2015-05-31 ENCOUNTER — Inpatient Hospital Stay: Payer: Medicare Other

## 2015-05-31 DIAGNOSIS — R001 Bradycardia, unspecified: Secondary | ICD-10-CM

## 2015-05-31 LAB — BASIC METABOLIC PANEL
Anion gap: 9 (ref 5–15)
BUN: 16 mg/dL (ref 6–20)
CALCIUM: 8 mg/dL — AB (ref 8.9–10.3)
CO2: 23 mmol/L (ref 22–32)
CREATININE: 0.54 mg/dL (ref 0.44–1.00)
Chloride: 107 mmol/L (ref 101–111)
GFR calc Af Amer: >60 mL/min (ref >60)
GLUCOSE: 108 mg/dL — AB (ref 65–99)
Potassium: 5 mmol/L (ref 3.5–5.1)
Sodium: 139 mmol/L (ref 135–145)

## 2015-05-31 LAB — CBC
HCT: 36.8 % (ref 35.0–47.0)
Hemoglobin: 12 g/dL (ref 12.0–16.0)
MCH: 29.1 pg (ref 26.0–34.0)
MCHC: 32.5 g/dL (ref 32.0–36.0)
MCV: 89.6 fL (ref 80.0–100.0)
PLATELETS: 293 10*3/uL (ref 150–440)
RBC: 4.1 MIL/uL (ref 3.80–5.20)
RDW: 14.1 % (ref 11.5–14.5)
WBC: 8.9 10*3/uL (ref 3.6–11.0)

## 2015-05-31 LAB — VALPROIC ACID LEVEL: Valproic Acid Lvl: 16 ug/mL — ABNORMAL LOW (ref 50.0–100.0)

## 2015-05-31 LAB — GLUCOSE, CAPILLARY
GLUCOSE-CAPILLARY: 100 mg/dL — AB (ref 65–99)
GLUCOSE-CAPILLARY: 102 mg/dL — AB (ref 65–99)
GLUCOSE-CAPILLARY: 143 mg/dL — AB (ref 65–99)
GLUCOSE-CAPILLARY: 93 mg/dL (ref 65–99)
GLUCOSE-CAPILLARY: 98 mg/dL (ref 65–99)
Glucose-Capillary: 141 mg/dL — ABNORMAL HIGH (ref 65–99)

## 2015-05-31 NOTE — Progress Notes (Addendum)
Patient ID: [name] Stuart, female   DOB: [birth year], 80 y.o.   MRN: [id number] [facility] PROGRESS NOTE  PCP: [facility]  HPI/Subjective: Patient arouses from sleep but went right back to sleep. As per the nurse she still having lots of secretions. The patient requires frequent suctioning.  Objective: Filed Vitals:   05/31/15 1200 05/31/15 1300  BP: 147/85 93/58  Pulse: 70 54  Temp: 97.8 F (36.6 C)   Resp: 21 15    Filed Weights   05/27/15 0702 05/27/15 1214 05/31/15 0500  Weight: 68.04 kg (150 lb) 79.5 kg (175 lb 4.3 oz) 86.6 kg (190 lb 14.7 oz)    ROS: Review of Systems  Unable to perform ROS  Limited review of systems secondary to patient being on the ventilator  Exam: Physical Exam  Constitutional: She appears lethargic.  HENT:  Nose: No mucosal edema.  Mouth/Throat: No oropharyngeal exudate or posterior oropharyngeal edema.  Eyes: Conjunctivae and lids are normal. Pupils are equal, round, and reactive to light.  Neck: No JVD present. Carotid bruit is not present. No edema present. No thyroid mass and no thyromegaly present.  Cardiovascular: S1 normal and S2 normal.  Exam reveals no gallop.   No murmur heard. Pulses:      Dorsalis pedis pulses are 2+ on the right side, and 2+ on the left side.  Respiratory: No accessory muscle usage. No respiratory distress. She has decreased breath sounds in the right lower field and the left lower field. She has no wheezes. She has rhonchi in the right middle field, the right lower field and the left lower field. She has no rales.  GI: Soft. Bowel sounds are normal. There is no tenderness.  Musculoskeletal:       Right ankle: She exhibits swelling.       Left ankle: She exhibits swelling.  Lymphadenopathy:    She has no cervical adenopathy.  Neurological: She appears lethargic.  Skin: Skin is warm. Nails show no clubbing.  Chronic lower extremity discoloration  Psychiatric:  Unable to assess  today    Data Reviewed: Basic Metabolic Panel:  Recent Labs Lab 05/27/15 0807 05/28/15 0458 05/29/15 0625 05/30/15 1151 05/31/15 0934  NA 126* 133* 136 138 139  K 3.2* 5.0 3.9 4.2 5.0  CL 98* 104 104 104 107  CO2 20* GLUCOSE 577* 136* 93 105* 108*  BUN CREATININE 0.62 0.66 0.50 0.54 0.54  CALCIUM 5.8* 8.3* 8.3* 8.5* 8.0*  MG 1.2* 2.0  --  2.2  --   PHOS 2.4*  --   --   --   --    Liver Function Tests:  Recent Labs Lab 05/27/15 0807 05/28/15 0458 05/30/15 1151  AST 17 13* 13*  ALT 6* 8* 8*  ALKPHOS 102 113 120  BILITOT 2.0* 0.6 0.6  PROT 4.7* 5.7* 5.8*  ALBUMIN 2.1* 2.7* 2.8*   CBC:  Recent Labs Lab 05/27/15 0651 05/28/15 0458 05/29/15 0625 05/31/15 0934  WBC 18.0* 14.6* 9.7 8.9  HGB 14.2 11.6* 10.8* 12.0  HCT 43.0 35.0 33.5* 36.8  MCV 90.7 90.5 90.8 89.6  PLT 325 256 266 293    CBG:  Recent Labs Lab 05/30/15 1938 05/31/15 0021 05/31/15 0354 05/31/15 0714 05/31/15 1101  GLUCAP 104* 98 102* 93 143*    Recent Results (from the past 240 hour(s))  Blood culture (routine x 2)     Status: None (Preliminary result)  Collection Time: 05/27/15  6:51 AM  Result Value Ref Range Status   Specimen Description BLOOD  Final   Special Requests NONE  Final   Culture NO GROWTH 4 DAYS  Final   Report Status PENDING  Incomplete  Urine culture     Status: None   Collection Time: 05/27/15  6:51 AM  Result Value Ref Range Status   Specimen Description URINE, CLEAN CATCH  Final   Special Requests NONE  Final   Culture NO GROWTH 2 DAYS  Final   Report Status 05/30/2015 FINAL  Final  Blood culture (routine x 2)     Status: None (Preliminary result)   Collection Time: 05/27/15  6:52 AM  Result Value Ref Range Status   Specimen Description BLOOD RIGHT HAND  Final   Special Requests BOTTLES DRAWN AEROBIC AND ANAEROBIC  2CC  Final   Culture NO GROWTH 4 DAYS  Final   Report Status PENDING  Incomplete  MRSA PCR Screening     Status:  None   Collection Time: 05/27/15 12:27 PM  Result Value Ref Range Status   MRSA by PCR NEGATIVE NEGATIVE Final    Comment:        The GeneXpert MRSA Assay (FDA approved for NASAL specimens only), is one component of a comprehensive MRSA colonization surveillance program. It is not intended to diagnose MRSA infection nor to guide or monitor treatment for MRSA infections.   Culture, expectorated sputum-assessment     Status: None   Collection Time: 05/30/15  3:10 PM  Result Value Ref Range Status   Specimen Description EXPECTORATED SPUTUM  Final   Special Requests Normal  Final   Sputum evaluation THIS SPECIMEN IS ACCEPTABLE FOR SPUTUM CULTURE  Final   Report Status 05/30/2015 FINAL  Final  Culture, respiratory (NON-Expectorated)     Status: None (Preliminary result)   Collection Time: 05/30/15  3:10 PM  Result Value Ref Range Status   Specimen Description EXPECTORATED SPUTUM  Final   Special Requests Normal Reflexed from [id number]  Final   Gram Stain PENDING  Incomplete   Culture NO GROWTH < 24 HOURS  Final   Report Status PENDING  Incomplete     Studies: Dg Chest 1 View  05/29/2015  CLINICAL DATA:  Intubation. EXAM: CHEST 1 VIEW COMPARISON:  05/28/2015 FINDINGS: Patient slightly rotated to the right. Endotracheal tube has tip 1.8 cm above the carina. Lungs are adequately inflated demonstrate bibasilar opacification left worse than right likely small effusions with associated atelectasis. Cannot exclude infection in the lung bases. Cardiomediastinal silhouette and remainder of the exam is unchanged. IMPRESSION: Bibasilar opacification left greater than right likely small effusions with atelectasis. Cannot exclude infection in the lung bases. Endotracheal tube with tip 1.8 cm above the carina. Electronically Signed   By: Elberta Fortisaniel  Boyle M.D.   On: 05/29/2015 19:54   Dg Abd 1 View  05/29/2015  CLINICAL DATA:  OG tube placement EXAM: ABDOMEN - 1 VIEW COMPARISON:  Radiograph 05/27/2015  FINDINGS: NG tube extends stomach.  Tip is in the gastric antrum. IMPRESSION: NG tube with tip in the gastric antrum. Electronically Signed   By: Genevive BiStewart  Edmunds M.D.   On: 05/29/2015 21:07   Dg Chest Port 1 View  05/31/2015  CLINICAL DATA:  80 year old female with respiratory failure. History of hypertension. EXAM: PORTABLE CHEST 1 VIEW COMPARISON:  Chest x-ray 05/29/2015. FINDINGS: An endotracheal tube is in place with tip 4.1 cm above the carina. A nasogastric tube is seen extending into the  stomach, however, the tip of the nasogastric tube extends below the lower margin of the image. Lung volumes are low. Moderate left pleural effusion. Opacities throughout the left mid to lower lung, and in the right lung base, may reflect areas of atelectasis and/or airspace consolidation. Overall appearance similar to the prior study. No evidence of pulmonary edema. Heart size is normal. Upper mediastinal contours are slightly distorted by patient positioning. IMPRESSION: 1. Support apparatus, as above. 2. Moderate left pleural effusion with areas of atelectasis and/or consolidation throughout the right lung base and left mid to lower lung, as above, similar to examination from 05/29/2015. Electronically Signed   By: Trudie Reed M.D.   On: 05/31/2015 14:12    Scheduled Meds: . amLODipine  5 mg Oral Daily  . ampicillin-sulbactam (UNASYN) IV  3 g Intravenous Q6H  . antiseptic oral rinse  7 mL Mouth Rinse QID  . budesonide (PULMICORT) nebulizer solution  0.25 mg Nebulization BID  . chlorhexidine gluconate  15 mL Mouth Rinse BID  . clopidogrel  75 mg Per Tube Daily  . enoxaparin (LOVENOX) injection  40 mg Subcutaneous Q24H  . famotidine (PEPCID) IV  20 mg Intravenous Q12H  . feeding supplement (VITAL HIGH PROTEIN)  1,000 mL Per Tube Q24H  . fluticasone  2 spray Each Nare Daily  . free water  200 mL Per Tube 3 times per day  . hydrALAZINE  20 mg Intravenous Once  . insulin aspart  0-15 Units Subcutaneous  6 times per day  . ipratropium-albuterol  3 mL Nebulization Q6H  . miconazole  1 application Topical BID  . OLANZapine  2.5 mg Per Tube BID  . pravastatin  10 mg Per Tube QHS  . predniSONE  40 mg Oral Q breakfast  . rOPINIRole  0.75 mg Per Tube QPM  . traZODone  150 mg Per Tube QHS  . valproic acid  250 mg Oral TID  . [START ON 06/25/2015] Vitamin D (Ergocalciferol)  50,000 Units Oral Q30 days   Continuous Infusions: . sodium chloride 50 mL/hr at 05/31/15 1400    Assessment/Plan:  1. Clinical sepsis, aspiration pneumonia.- Patient is Unasyn. Patient is status post bronchoscopy removing Peas from right main stem bronchus. So far sputum culture is negative. Blood culture and urine culture negative 2. Acute respiratory failure with hypoxia- Still with lots of secretions at this time and needs frequent suctioning. Continue ventilator support today and weaning process tomorrow. FiO2 was 28%. Pulmonary left on the ventilator for today. 3. Hyperglycemia on presentation- sugars have improved 4. Hypocalcemia on presentation- this is also improved 5. Wheezing in lungs with history of COPD- nebulizer treatments, prednisone 6. History of stroke- on Plavix and pravastatin 7. Dysphasia- a safe diet for this patient will have to be figured out prior to discharge back to facility  8. Bradycardia on telemetry monitoring- no further workup at this point   Code Status:     Code Status Orders        Start     Ordered   05/27/15 1226  Full code   Continuous     05/27/15 1226    Advance Directive Documentation        Most Recent Value   Type of Advance Directive  Healthcare Power of Attorney, Living will   Pre-existing out of facility DNR order (yellow form or pink MOST form)     "MOST" Form in Place?       Family Communication: Husband at bedside Disposition Plan: Back to  [facility] once stable.  Consultants:  Critical care specialist  Procedures:  Bronchoscopy  ET tube  placement x2  Antibiotics:  Unasyn   Time spent: 30 minutes   Alford Highland  [facility]

## 2015-05-31 NOTE — Progress Notes (Signed)
Patient's HR dropped to 43.  Attempted to arouse, much more lethargic than the last assessment; not very responsive to loud voice, shaking, nailbed pressure, or sternal rub.  Patient finally aroused and opened eyes to gag reflex and suctioning through ET tube, patient once again following commands and making eye contact.  Pupils still equal and brisk, blood sugar 98, O2 saturation 95%.  HR came back up to the 50/60's as it was before.  Notified eLink MD, no new orders received.  Will continue to monitor.

## 2015-05-31 NOTE — Consult Note (Signed)
PULMONARY / CRITICAL CARE MEDICINE   Name: [name] Stuart MRN: [id number] DOB: 11/27/1934    ADMISSION DATE:  05/27/2015 CONSULTATION DATE: 11/23  REFERRING MD : Dietrich Pates  PT PROFILE: 50F NH resident X several years after R hemispheric CVA with prior aspiration PNAs admitted via ED with acute respiratory distress and hypoxemia. CT chest reveals patchy infiltrates and "debris" in R main bronchus. Intubated 11/23 for FOB  HISTORY OF PRESENT ILLNESS:   80 y.o. female with a known history of cerebrovascular accident, with paralysis living in nursing home for last 2 years, using oxygen as needed basis and not able to walk but still able to feed herself with her arms, also history of the hypercholesterolemia, anxiety and bipolar disorder, esophageal reflux-was taken by husband yesterday to home for the holidays when she was at her baseline condition. She also uses albuterol nebulizers so husband gave her nebulizer treatment 2-3 times overnight but around 2 or 3 in the nighttime he noted that the patient is getting more and more short of breath in spite of using albuterol treatment and she felt very warm so he brought her to emergency room in the morning., In ER patient is noted to have right upper lobe pneumonia with questionable nodularity, hypoxic required BiPAP , high lactic acid, low calcium level, slight hypotension and tachycardia - and as per husband she is also more confused than her baseline - so she is given for admission for sepsis and pneumonia.   SUBJECTIVE: Overnight had an episode of bradycardia, asymptomatic, was moderately lethargic, then more awake with stimulation. This morning following commands awake alert, per nurse report still with thick secretions, sputum sample sent.   PAST MEDICAL HISTORY :  She  has a past medical history of Other specified rehabilitation procedure(V57.89); Unspecified constipation; Esophageal reflux; Chronic airway obstruction, not elsewhere  classified; Other specified disorder of skin; Other symptoms involving skin and integumentary tissues; Difficulty in walking(719.7); Muscle weakness (generalized); Dysphagia, oropharyngeal phase; Cognitive communication deficit; Anxiety state, unspecified; Unspecified essential hypertension; Unspecified late effects of cerebrovascular disease; Depressive disorder, not elsewhere classified; Other and unspecified hyperlipidemia; Restless legs syndrome (RLS); Other chronic pain; Insomnia, unspecified; Pressure ulcer, buttock(707.05); Unspecified episodic mood disorder; and Stroke (HCC).  PAST SURGICAL HISTORY: She  has past surgical history that includes Cholecystectomy and Bony pelvis surgery.  Allergies  Allergen Reactions  . Aspirin   . Codeine Sulfate   . Ibuprofen   . Penicillins     No current facility-administered medications on file prior to encounter.   Current Outpatient Prescriptions on File Prior to Encounter  Medication Sig  . acetaminophen (TYLENOL) 325 MG tablet Take 650 mg by mouth every 4 (four) hours as needed for mild pain, moderate pain or fever.   . ALPRAZolam (XANAX) 0.25 MG tablet Take 0.25 mg by mouth every 6 (six) hours as needed for anxiety.   Marland Kitchen amLODipine (NORVASC) 2.5 MG tablet Take 2.5 mg by mouth daily.  . clopidogrel (PLAVIX) 75 MG tablet Take 75 mg by mouth daily.   . miconazole (MICOTIN) 2 % cream Apply 1 application topically 2 (two) times daily. To buttocks and sacrum  . omeprazole (PRILOSEC) 20 MG capsule Take 20 mg by mouth every morning.   . polyethylene glycol (MIRALAX / GLYCOLAX) packet Take 17 g by mouth daily as needed for mild constipation, moderate constipation or severe constipation.   . pravastatin (PRAVACHOL) 10 MG tablet Take 10 mg by mouth at bedtime.     FAMILY HISTORY:  Her  has no family status information on file.   SOCIAL HISTORY: She  reports that she has been smoking.  She does not have any smokeless tobacco history on file. She  reports that she does not drink alcohol.  REVIEW OF SYSTEMS:   - Unable to fully obtain, patient on mechanical ventilator    VITAL SIGNS: BP 120/54 mmHg  Pulse 46  Temp(Src) 97.9 F (36.6 C) (Axillary)  Resp 15  Ht  (1.676 m)  Wt 190 lb 14.7 oz (86.6 kg)  BMI 30.83 kg/m2  SpO2 97%  HEMODYNAMICS:    VENTILATOR SETTINGS: Vent Mode:  [-] PRVC FiO2 (%):  [28 %] 28 % Set Rate:  [12 bmp] 12 bmp Vt Set:  [480 mL] 480 mL PEEP:  [5 cmH20] 5 cmH20  INTAKE / OUTPUT: I/O last 3 completed shifts: In: 3245.8 [I.V.:1775.8; NG/GT:670; IV Piggyback:800] Out: 1220 [Urine:1135; Emesis/NG output:85]  PHYSICAL EXAMINATION: General: RASS 0, + F/C Neuro: Mild weakness on L HEENT: edentulous, NCAT Cardiovascular: nl rate, reg, no M Lungs: good air movement throughout, mild dec right lung sounds Abdomen: obese, soft, NT, +BS Ext: warm, R>L LE edema  LABS:  CBC  Recent Labs Lab 05/27/15 0651 05/28/15 0458 05/29/15 0625  WBC 18.0* 14.6* 9.7  HGB 14.2 11.6* 10.8*  HCT 43.0 35.0 33.5*  PLT 325 256 266   Coag's No results for input(s): APTT, INR in the last 168 hours. BMET  Recent Labs Lab 05/28/15 0458 05/29/15 0625 05/30/15 1151  NA 133* 136 138  K 5.0 3.9 4.2  CL 104 104 104  CO2 BUN CREATININE 0.66 0.50 0.54  GLUCOSE 136* 93 105*   Electrolytes  Recent Labs Lab 05/27/15 0807 05/28/15 0458 05/29/15 0625 05/30/15 1151  CALCIUM 5.8* 8.3* 8.3* 8.5*  MG 1.2* 2.0  --  2.2  PHOS 2.4*  --   --   --    Sepsis Markers  Recent Labs Lab 05/27/15 0652 05/27/15 0945  LATICACIDVEN 4.4* 2.3*   ABG  Recent Labs Lab 05/29/15 2130  PHART 7.36  PCO2ART 46  PO2ART 68*   Liver Enzymes  Recent Labs Lab 05/27/15 0807 05/28/15 0458 05/30/15 1151  AST 17 13* 13*  ALT 6* 8* 8*  ALKPHOS 102 113 120  BILITOT 2.0* 0.6 0.6  ALBUMIN 2.1* 2.7* 2.8*   Cardiac Enzymes No results for input(s): TROPONINI, PROBNP in the last 168  hours. Glucose  Recent Labs Lab 05/30/15 0705 05/30/15 1113 05/30/15 1626 05/30/15 1938 05/31/15 0021 05/31/15 0714  GLUCAP 112* 111* 95 104* 98 93    Imaging No results found.   CULTURES: Blood 11/23 >>  BAL 11/23 >>  Sputum Cx 11/26>>  ANTIBIOTICS: Vanc 11/23 >> 11/25 Meropenem 11/23 >> 11/25 Unasyn 11/25>>  SIGNIFICANT EVENTS: 11/23 electively intubated for FOB 11/25>>extubated>>reintubated 2/2 to resp distress and secretions  LINES/TUBES: ETT 11/23 >> 11/25 ETT 11/25>>  DISCUSSION: "debris" in R main bronchus does not have appearance of mucus. It is likely either pill or food matter. Less likely endobronchial tumor  ASSESSMENT / PLAN:  PULMONARY A: Acute respiratory failure H/O recurrent aspirations R mainstem bronchus lesion - aspirated foreign body/material P:   Vent settings established - PSV, failed extubation on 11/25. Plan to keep on vent for today, cont with steroids, nebs and abx. Prednisone  for another 1 days, then taper over 2 weeks Vent bundle implemented Daily SBT as indicated CXR today and ABG  CARDIOVASCULAR A:  Bradycardia  P:  Monitor hemodynamics Hold Requip for now  RENAL A:   Hypokalemia - improving P:   Monitor BMET intermittently Monitor I/Os Correct electrolytes as indicated IVFs adjusted 11/23  GASTROINTESTINAL A:   Suspected dysphagia P:   SUP: IV famotidine Consider SLP eval before oral nutrition  HEMATOLOGIC A:   Asymmetric LE edema P:  DVT px: Enoxaparin Monitor CBC intermittently Transfuse per usual ICU guidelines LE venous Dopplers ordered 11/23  INFECTIOUS A:   Aspiration PNA P:   Monitor temp, WBC count Micro and abx as above  ENDOCRINE A:   Severe hyperglycemia without prior dx of DM   P:   SSI ordered  NEUROLOGIC A:   Prior CVA Chronic debilitation Chronic depression P:   RASS goal: -1, 0 PAD protocol Avoid over sedation  PSYCH Hx of Bipolar - resides at [facility]  resources, may need to address psych/sedative meds after extubation, given high aspiration risk.   I have personally obtained a history, examined the patient, evaluated laboratory and imaging results, formulated the assessment and plan and placed orders. CRITICAL CARE: The patient is critically ill with multiple organ systems failure and requires high complexity decision making for assessment and support, frequent evaluation and titration of therapies, application of advanced monitoring technologies and extensive interpretation of multiple databases.   Critical Care Time devoted to patient care services described in this note is 40 minutes.    Stephanie AcreVishal Ciro Tashiro, MD [facility] Pager [phone] (please enter 7-digits) On Call Pager - [phone] (please enter 7-digits)  05/31/2015, 9:35 AM

## 2015-05-31 NOTE — Progress Notes (Signed)
Nutrition Follow-up      INTERVENTION:  EN: Continue vital high protein at goal rate of 6060ml/hr to meet nutritional needs   NUTRITION DIAGNOSIS:   Inadequate oral intake related to acute illness as evidenced by NPO status.    GOAL:   Provide needs based on ASPEN/SCCM guidelines    MONITOR:    (Energy intake, Pulmonary profile, Electrolyte and renal profile)  REASON FOR ASSESSMENT:   Consult Enteral/tube feeding initiation and management  ASSESSMENT:      Pt remains on vent   Current Nutrition: tolerating vital high protein at 4160ml/hr   Gastrointestinal Profile: noted 0 ml residuals Last BM: 11/25   Scheduled Medications:  . amLODipine  5 mg Oral Daily  . ampicillin-sulbactam (UNASYN) IV  3 g Intravenous Q6H  . antiseptic oral rinse  7 mL Mouth Rinse QID  . budesonide (PULMICORT) nebulizer solution  0.25 mg Nebulization BID  . chlorhexidine gluconate  15 mL Mouth Rinse BID  . clopidogrel  75 mg Per Tube Daily  . enoxaparin (LOVENOX) injection  40 mg Subcutaneous Q24H  . famotidine (PEPCID) IV  20 mg Intravenous Q12H  . feeding supplement (VITAL HIGH PROTEIN)  1,000 mL Per Tube Q24H  . fluticasone  2 spray Each Nare Daily  . free water  200 mL Per Tube 3 times per day  . hydrALAZINE  20 mg Intravenous Once  . insulin aspart  0-15 Units Subcutaneous 6 times per day  . ipratropium-albuterol  3 mL Nebulization Q6H  . miconazole  1 application Topical BID  . OLANZapine  2.5 mg Per Tube BID  . pravastatin  10 mg Per Tube QHS  . predniSONE  40 mg Oral Q breakfast  . rOPINIRole  0.75 mg Per Tube QPM  . traZODone  150 mg Per Tube QHS  . valproic acid  250 mg Oral TID  . [START ON 06/25/2015] Vitamin D (Ergocalciferol)  50,000 Units Oral Q30 days    Continuous Medications:  . sodium chloride 50 mL/hr at 05/30/15 2200     Electrolyte/Renal Profile and Glucose Profile:   Recent Labs Lab 05/27/15 0807 05/28/15 0458 05/29/15 0625 05/30/15 1151  NA  126* 133* 136 138  K 3.2* 5.0 3.9 4.2  CL 98* 104 104 104  CO2 20* 26 24 24   BUN 8 11 10 11   CREATININE 0.62 0.66 0.50 0.54  CALCIUM 5.8* 8.3* 8.3* 8.5*  MG 1.2* 2.0  --  2.2  PHOS 2.4*  --   --   --   GLUCOSE 577* 136* 93 105*   Protein Profile:  Recent Labs Lab 05/27/15 0807 05/28/15 0458 05/30/15 1151  ALBUMIN 2.1* 2.7* 2.8*     Weight Trend since Admission: Filed Weights   05/27/15 0702 05/27/15 1214 05/31/15 0500  Weight: 150 lb (68.04 kg) 175 lb 4.3 oz (79.5 kg) 190 lb 14.7 oz (86.6 kg)      Diet Order:  Diet NPO time specified  Skin:   reviewed    Height:   Ht Readings from Last 1 Encounters:  05/27/15 5\' 6"  (1.676 m)    Weight:   Wt Readings from Last 1 Encounters:  05/31/15 190 lb 14.7 oz (86.6 kg)    Ideal Body Weight:     BMI:  Body mass index is 30.83 kg/(m^2).  Estimated Nutritional Needs:   Kcal:  TEE 1446 kcals/d (Ve 7.6, T max 37)  Protein:  (1.2-2.0 g/kg) 96-160 g/d  Fluid:  (25-7430ml/kg) 2000-246900ml/d  EDUCATION NEEDS:  No education needs identified at this time  [name] Stuart  Terrie Haring B. Zenia Resides, [place], [place] (pager)

## 2015-06-01 LAB — CULTURE, BLOOD (ROUTINE X 2)
CULTURE: NO GROWTH
CULTURE: NO GROWTH

## 2015-06-01 LAB — BLOOD GAS, ARTERIAL
Acid-Base Excess: 4.6 mmol/L — ABNORMAL HIGH (ref 0.0–3.0)
Allens test (pass/fail): POSITIVE — AB
BICARBONATE: 28.3 meq/L — AB (ref 21.0–28.0)
FIO2: 0.28
MECHVT: 480 mL
O2 Saturation: 94.6 %
PEEP: 5 cmH2O
PH ART: 7.48 — AB (ref 7.350–7.450)
Patient temperature: 37
RATE: 12 resp/min
pCO2 arterial: 38 mmHg (ref 32.0–48.0)
pO2, Arterial: 68 mmHg — ABNORMAL LOW (ref 83.0–108.0)

## 2015-06-01 LAB — CULTURE, RESPIRATORY W GRAM STAIN
Culture: NO GROWTH
Special Requests: NORMAL

## 2015-06-01 LAB — BASIC METABOLIC PANEL
ANION GAP: 5 (ref 5–15)
BUN: 23 mg/dL — ABNORMAL HIGH (ref 6–20)
CALCIUM: 8 mg/dL — AB (ref 8.9–10.3)
CHLORIDE: 106 mmol/L (ref 101–111)
CO2: 27 mmol/L (ref 22–32)
Creatinine, Ser: 0.55 mg/dL (ref 0.44–1.00)
GFR calc non Af Amer: >60 mL/min (ref >60)
Glucose, Bld: 179 mg/dL — ABNORMAL HIGH (ref 65–99)
Potassium: 3.6 mmol/L (ref 3.5–5.1)
SODIUM: 138 mmol/L (ref 135–145)

## 2015-06-01 LAB — GLUCOSE, CAPILLARY
GLUCOSE-CAPILLARY: 119 mg/dL — AB (ref 65–99)
GLUCOSE-CAPILLARY: 152 mg/dL — AB (ref 65–99)
Glucose-Capillary: 109 mg/dL — ABNORMAL HIGH (ref 65–99)
Glucose-Capillary: 130 mg/dL — ABNORMAL HIGH (ref 65–99)
Glucose-Capillary: 85 mg/dL (ref 65–99)
Glucose-Capillary: 91 mg/dL (ref 65–99)
Glucose-Capillary: 98 mg/dL (ref 65–99)

## 2015-06-01 MED ORDER — ACETYLCYSTEINE 20 % IN SOLN
2.0000 mL | RESPIRATORY_TRACT | Status: DC
  Administered 2015-06-01 – 2015-06-02 (×6): 2 mL via RESPIRATORY_TRACT
  Administered 2015-06-02: 4 mL via RESPIRATORY_TRACT
  Administered 2015-06-02 (×2): 2 mL via RESPIRATORY_TRACT
  Administered 2015-06-03: 4 mL via RESPIRATORY_TRACT
  Administered 2015-06-03 (×2): 2 mL via RESPIRATORY_TRACT
  Administered 2015-06-03 (×2): 4 mL via RESPIRATORY_TRACT
  Administered 2015-06-04 (×2): 2 mL via RESPIRATORY_TRACT
  Administered 2015-06-04: 04:00:00 4 mL via RESPIRATORY_TRACT
  Administered 2015-06-04: 2 mL via RESPIRATORY_TRACT
  Administered 2015-06-04: 4 mL via RESPIRATORY_TRACT
  Administered 2015-06-05 (×2): 2 mL via RESPIRATORY_TRACT
  Administered 2015-06-05: 11:00:00 4 mL via RESPIRATORY_TRACT
  Filled 2015-06-01 (×19): qty 4

## 2015-06-01 MED ORDER — IPRATROPIUM-ALBUTEROL 0.5-2.5 (3) MG/3ML IN SOLN
3.0000 mL | RESPIRATORY_TRACT | Status: DC
  Administered 2015-06-01 – 2015-06-05 (×23): 3 mL via RESPIRATORY_TRACT
  Filled 2015-06-01 (×23): qty 3

## 2015-06-01 MED ORDER — ACETYLCYSTEINE 10% NICU INHALATION SOLUTION
2.0000 mL | RESPIRATORY_TRACT | Status: DC
  Filled 2015-06-01 (×7): qty 2

## 2015-06-01 NOTE — Progress Notes (Signed)
   06/01/15 1300  Clinical Encounter Type  Visited With Patient and family together;Health care provider  Visit Type Initial  Consult/Referral To Chaplain  Spiritual Encounters  Spiritual Needs Emotional  Stress Factors  Patient Stress Factors Health changes  Family Stress Factors Other (Comment)  Chaplain rounded in the unit and went in to visit with patient and family. Offered a compassionate presence and care.  Chaplain Franca Stakes A. Dehaven Sine Ext. [phone]

## 2015-06-01 NOTE — Progress Notes (Signed)
Initial Nutrition Assessment    INTERVENTION:   EN: recommend continuing current TF at current goal rate as per order set   NUTRITION DIAGNOSIS:   Inadequate oral intake related to acute illness as evidenced by NPO status. Being addressed via TF  GOAL:   Provide needs based on ASPEN/SCCM guidelines  MONITOR:    (Energy intake, Pulmonary profile, Electrolyte and renal profile)  REASON FOR ASSESSMENT:   Consult Enteral/tube feeding initiation and management  ASSESSMENT:    Pt remains on vent, lots of secretions with frequent suctioning, no plan for extubation today  Diet Order:  Diet NPO time specified   EN: tolerating Vital High Protein at rate of 60 ml/hr  Digestive System: +BM 11/28, no signs of TF intolerance   Electrolyte and Renal Profile:  Recent Labs Lab 05/27/15 0807 05/28/15 0458 05/29/15 0625 05/30/15 1151 05/31/15 0934  BUN 8 11 10 11 16   CREATININE 0.62 0.66 0.50 0.54 0.54  NA 126* 133* 136 138 139  K 3.2* 5.0 3.9 4.2 5.0  MG 1.2* 2.0  --  2.2  --   PHOS 2.4*  --   --   --   --    Glucose Profile:  Recent Labs  06/01/15 0026 06/01/15 0412 06/01/15 0707  GLUCAP 109* 98 85   Meds: NS at 50 ml/hr, ss novolog, prednisone  Height:   Ht Readings from Last 1 Encounters:  05/27/15 5\' 6"  (1.676 m)    Weight:   Wt Readings from Last 1 Encounters:  06/01/15 193 lb 12.6 oz (87.9 kg)   Filed Weights   05/27/15 1214 05/31/15 0500 06/01/15 0435  Weight: 175 lb 4.3 oz (79.5 kg) 190 lb 14.7 oz (86.6 kg) 193 lb 12.6 oz (87.9 kg)    BMI:  Body mass index is 31.29 kg/(m^2).  Estimated Nutritional Needs:   Kcal:  TEE 1446 kcals/d (Ve 7.6, T max 37)  Protein:  (1.2-2.0 g/kg) 96-160 g/d  Fluid:  (25-530ml/kg) 2000-245500ml/d  EDUCATION NEEDS:   No education needs identified at this time  HIGH Care Level  Romelle Starcherate Neidy Guerrieri MS, RD, LDN [phone] Pager

## 2015-06-01 NOTE — Progress Notes (Addendum)
ANTIBIOTIC CONSULT NOTE - INITIAL  Pharmacy Consult for Unasyn  Indication: pneumonia  Allergies  Allergen Reactions  . Aspirin   . Codeine Sulfate   . Ibuprofen   . Penicillins     Patient Measurements: Height: 5\' 6"  (167.6 cm) Weight: 193 lb 12.6 oz (87.9 kg) IBW/kg (Calculated) : 59.3   Vital Signs: Temp: 97.9 F (36.6 C) (11/28 0715) Temp Source: Axillary (11/28 0715) BP: 140/59 mmHg (11/28 0700) Pulse Rate: 106 (11/28 0700)  Labs:  Recent Labs  05/30/15 1151 05/31/15 0934  WBC  --  8.9  HGB  --  12.0  PLT  --  293  CREATININE 0.54 0.54   Estimated Creatinine Clearance: 64.7 mL/min (by C-G formula based on Cr of 0.54). No results for input(s): VANCOTROUGH, VANCOPEAK, VANCORANDOM, GENTTROUGH, GENTPEAK, GENTRANDOM, TOBRATROUGH, TOBRAPEAK, TOBRARND, AMIKACINPEAK, AMIKACINTROU, AMIKACIN in the last 72 hours.   Microbiology: Recent Results (from the past 720 hour(s))  Blood culture (routine x 2)     Status: None   Collection Time: 05/27/15  6:51 AM  Result Value Ref Range Status   Specimen Description BLOOD  Final   Special Requests NONE  Final   Culture NO GROWTH 5 DAYS  Final   Report Status 06/01/2015 FINAL  Final  Urine culture     Status: None   Collection Time: 05/27/15  6:51 AM  Result Value Ref Range Status   Specimen Description URINE, CLEAN CATCH  Final   Special Requests NONE  Final   Culture NO GROWTH 2 DAYS  Final   Report Status 05/30/2015 FINAL  Final  Blood culture (routine x 2)     Status: None   Collection Time: 05/27/15  6:52 AM  Result Value Ref Range Status   Specimen Description BLOOD RIGHT HAND  Final   Special Requests BOTTLES DRAWN AEROBIC AND ANAEROBIC  2CC  Final   Culture NO GROWTH 5 DAYS  Final   Report Status 06/01/2015 FINAL  Final  MRSA PCR Screening     Status: None   Collection Time: 05/27/15 12:27 PM  Result Value Ref Range Status   MRSA by PCR NEGATIVE NEGATIVE Final    Comment:        The GeneXpert MRSA Assay  (FDA approved for NASAL specimens only), is one component of a comprehensive MRSA colonization surveillance program. It is not intended to diagnose MRSA infection nor to guide or monitor treatment for MRSA infections.   Culture, expectorated sputum-assessment     Status: None   Collection Time: 05/30/15  3:10 PM  Result Value Ref Range Status   Specimen Description EXPECTORATED SPUTUM  Final   Special Requests Normal  Final   Sputum evaluation THIS SPECIMEN IS ACCEPTABLE FOR SPUTUM CULTURE  Final   Report Status 05/30/2015 FINAL  Final  Culture, respiratory (NON-Expectorated)     Status: None   Collection Time: 05/30/15  3:10 PM  Result Value Ref Range Status   Specimen Description EXPECTORATED SPUTUM  Final   Special Requests Normal Reflexed from [id number]  Final   Gram Stain   Final    MODERATE WBC SEEN NO ORGANISMS SEEN GOOD SPECIMEN - 80-90% WBCS    Culture NO GROWTH 2 DAYS  Final   Report Status 06/01/2015 FINAL  Final    Assessment: 80 yo female being treated for aspiration PNA. Pharmacy consulted for Unasyn dosing and monitoring. Patient was started on meropenem and vanc on admission 11/23, Vancomycin was D'cd 11/24, meropenem D'cd during AM rounds. Unknown PCN  allergy, tolerated meropenem and no ADRs seen with administration of Unasyn.   Plan:  Will continue patient on Unasyn 3g q6H. Stop date for Unasyn is 12/2.  Pharmacy will continue to monitor renal function and labs and make adjustments as needed.   Luisa Hart, PharmD 06/01/2015

## 2015-06-01 NOTE — Progress Notes (Signed)
[facility] Critical Care Medicine Progess Note    ASSESSMENT/PLAN   33F NH resident X several years after R hemispheric CVA with prior aspiration PNAs admitted via ED with acute respiratory distress and hypoxemia. Intubated 11/23 for  FOB which revealed aspirated food.   PULMONARY A: Acute respiratory failure; Requiring suctioning every 45 minutes of thick secretions.  H/O recurrent aspirations R mainstem bronchus lesion - aspirated foreign body/material P:  Vent settings established - PSV, failed extubation on 11/25.  Prednisone  for another 1 days, then taper over 2 weeks Vent bundle implemented Daily SBT as indicated-however, given the necessity of very frequent suctionings with difficulty in controlling her secretions, I discussed with the patient and husband at bedside that we will hold off on extubation today.   CARDIOVASCULAR A:  Bradycardia P:  Monitor hemodynamics Hold Requip for now  RENAL A:  Hypokalemia - improving P:  Monitor BMET intermittently Monitor I/Os Correct electrolytes as indicated IVFs adjusted 11/23  GASTROINTESTINAL A:  Suspected dysphagia P:  SUP: IV famotidine Consider SLP eval before oral nutrition  HEMATOLOGIC A:  Asymmetric LE edema P:  DVT px: Enoxaparin Monitor CBC intermittently Transfuse per usual ICU guidelines   INFECTIOUS A:  Aspiration PNA P:  Monitor temp, WBC count Micro and abx as above  Unasyn 05/29/15>> Sputum 05/30/15; pending.  Bld Cx, 05/27/15; negative.  Urine Cx; 05/27/15; negative.    CXR 05/31/15; small pleural effusion, left; bilat. Infiltrates.   ENDOCRINE A:  Severe hyperglycemia without prior dx of DM  P:  SSI ordered  NEUROLOGIC A:  Prior CVA Chronic debilitation Chronic depression P:  RASS goal: -1, 0 PAD protocol Avoid over sedation  PSYCH Hx of Bipolar - resides at [facility], may need to address psych/sedative meds after extubation,  given high aspiration risk.   ---------------------------------------   ----------------------------------------   Name: [name] Stuart MRN: [id number] DOB: June 06, 1935    ADMISSION DATE:  05/27/2015    CHIEF COMPLAINT: Dyspnea   SUBJECTIVE/ review of systems:   Pt currently on the ventilator, can not provide history or review of systems.     VITAL SIGNS: Temp:  [97.6 F (36.4 C)-98.4 F (36.9 C)] 97.9 F (36.6 C) (11/28 0715) Pulse Rate:  [33-106] 106 (11/28 0700) Resp:  [14-27] 16 (11/28 0700) BP: (93-189)/(50-110) 140/59 mmHg (11/28 0700) SpO2:  [93 %-100 %] 97 % (11/28 0808) FiO2 (%):  [28 %] 28 % (11/28 0808) Weight:  [87.9 kg (193 lb 12.6 oz)] 87.9 kg (193 lb 12.6 oz) (11/28 0435) HEMODYNAMICS:   VENTILATOR SETTINGS: Vent Mode:  [-] PRVC FiO2 (%):  [28 %] 28 % Set Rate:  [12 bmp] 12 bmp Vt Set:  [480 mL] 480 mL PEEP:  [5 cmH20] 5 cmH20 INTAKE / OUTPUT:  Intake/Output Summary (Last 24 hours) at 06/01/15 0943 Last data filed at 06/01/15 0715  Gross per 24 hour  Intake 3522.5 ml  Output    870 ml  Net 2652.5 ml    PHYSICAL EXAMINATION: Physical Examination:   VS: BP 140/59 mmHg  Pulse 106  Temp(Src) 97.9 F (36.6 C) (Axillary)  Resp 16  Ht  (1.676 m)  Wt 87.9 kg (193 lb 12.6 oz)  BMI 31.29 kg/m2  SpO2 97%  General Appearance: No distress  Neuro:without focal findings, mental status normal. HEENT: PERRLA, EOM intact. Pulmonary: normal breath sounds   CardiovascularNormal S1,S2.  No m/r/g.   Abdomen: Benign, Soft, non-tender. Renal:  No costovertebral tenderness  GU:  Not performed at  this time. Endocrine: No evident thyromegaly. Skin:   warm, no rashes, no ecchymosis  Extremities: normal, no cyanosis, clubbing.   LABS:   LABORATORY PANEL:   CBC  Recent Labs Lab 05/31/15 0934  WBC 8.9  HGB 12.0  HCT 36.8  PLT 293    Chemistries   Recent Labs Lab 05/27/15 0807  05/30/15 1151 05/31/15 0934  NA 126*  < > 138 139    K 3.2*  < > 4.2 5.0  CL 98*  < > 104 107  CO2 20*  < > 24 23  GLUCOSE 577*  < > 105* 108*  BUN 8  < > 11 16  CREATININE 0.62  < > 0.54 0.54  CALCIUM 5.8*  < > 8.5* 8.0*  MG 1.2*  < > 2.2  --   PHOS 2.4*  --   --   --   AST 17  < > 13*  --   ALT 6*  < > 8*  --   ALKPHOS 102  < > 120  --   BILITOT 2.0*  < > 0.6  --   < > = values in this interval not displayed.   Recent Labs Lab 05/31/15 1101 05/31/15 1608 05/31/15 2010 06/01/15 0026 06/01/15 0412 06/01/15 0707  GLUCAP 143* 141* 100* 109* 98 85    Recent Labs Lab 05/29/15 2130 05/31/15 1300  PHART 7.36 7.48*  PCO2ART 46 38  PO2ART 68* 68*    Recent Labs Lab 05/27/15 0807 05/28/15 0458 05/30/15 1151  AST 17 13* 13*  ALT 6* 8* 8*  ALKPHOS 102 113 120  BILITOT 2.0* 0.6 0.6  ALBUMIN 2.1* 2.7* 2.8*    Cardiac Enzymes No results for input(s): TROPONINI in the last 168 hours.  RADIOLOGY:  Dg Chest Port 1 View  05/31/2015  CLINICAL DATA:  80 year old female with respiratory failure. History of hypertension. EXAM: PORTABLE CHEST 1 VIEW COMPARISON:  Chest x-ray 05/29/2015. FINDINGS: An endotracheal tube is in place with tip 4.1 cm above the carina. A nasogastric tube is seen extending into the stomach, however, the tip of the nasogastric tube extends below the lower margin of the image. Lung volumes are low. Moderate left pleural effusion. Opacities throughout the left mid to lower lung, and in the right lung base, may reflect areas of atelectasis and/or airspace consolidation. Overall appearance similar to the prior study. No evidence of pulmonary edema. Heart size is normal. Upper mediastinal contours are slightly distorted by patient positioning. IMPRESSION: 1. Support apparatus, as above. 2. Moderate left pleural effusion with areas of atelectasis and/or consolidation throughout the right lung base and left mid to lower lung, as above, similar to examination from 05/29/2015. Electronically Signed   By: Trudie Reedaniel  Entrikin  M.D.   On: 05/31/2015 14:12       --Wells Guileseep Drae Mitzel, MD.  [facility]   Santiago Gladavid Kasa, M.D.  Stephanie AcreVishal Mungal, M.D.  Billy Fischeravid Simonds, M.D  Critical Care Attestation.  I have personally obtained a history, examined the patient, evaluated laboratory and imaging results, formulated the assessment and plan and placed orders.the case was discussed with the critical care RN, the unit manager, respiratory therapist, nutrition, ICU pharmacist. The Patient requires high complexity decision making for assessment and support, frequent evaluation and titration of therapies, application of advanced monitoring technologies and extensive interpretation of multiple databases. The patient has critical illness that could lead imminently to failure of 1 or more organ systems and requires the highest level of physician preparedness  to intervene.  Critical Care Time devoted to patient care services described in this note is 35 minutes and is exclusive of time spent in procedures.

## 2015-06-01 NOTE — Care Management Note (Signed)
Case Management Note  Patient Details  Name: KAMRON PORTEE MRN: [id number] Date of Birth: 1935/03/23  Subjective/Objective:                  Met with patient and her husband. Patient is vented but not sedated. She is a resident at [facility] over last 2 years. Her husband takes her home on occasion. She is not on O2 at [facility]. She is able to feed herself per husband "but has been aspirating" which he relates to this admission. He states she has been on a mechanical soft diet at [facility]/home. She is however wheelchair bound. PT had recommending HHPT but per the husband the plan is for patient to return to [facility] under long-term care.  Action/Plan:  CSW consult placed.   Expected Discharge Date:                  Expected Discharge Plan:     In-House Referral:  Clinical Social Work  Discharge planning Services  CM Consult  Post Acute Care Choice:    Choice offered to:  Spouse  DME Arranged:    DME Agency:     HH Arranged:    [facility] Agency:     Status of Service:  In process, will continue to follow  Medicare Important Message Given:    Date Medicare IM Given:    Medicare IM give by:    Date Additional Medicare IM Given:    Additional Medicare Important Message give by:     If discussed at [place] of Stay Meetings, dates discussed:    Additional Comments:  Marshell Garfinkel, RN 06/01/2015, 12:57 PM

## 2015-06-01 NOTE — Progress Notes (Signed)
PT Cancellation Note  Patient Details Name: [name] Stuart MRN: [id number] DOB: [birth year]   Cancelled Treatment:    Reason Eval/Treat Not Completed: Medical issues which prohibited therapy (Patient re-intubated due to respiratory decline since initial PT evaluation.  Due to change/decline in status, will require new PT orders to resume care.  Please re-consult as medically appropriate once extubated.)   Lamoyne Hessel H. Manson PasseyBrown, PT, DPT, NCS 06/01/2015, 3:24 PM [phone]

## 2015-06-01 NOTE — Progress Notes (Signed)
Patient ID: [name] Stuart, female   DOB: [birth year], 80 y.o.   MRN: [id number] [facility] PROGRESS NOTE  PCP: [facility]  HPI/Subjective: Patient awakened. She tries to talk but cannot because of the ventilator. She is very anxious and had a shaking tremor of her left arm when I was in the room. She was following commands at that time.  Objective: Filed Vitals:   06/01/15 1300 06/01/15 1500  BP: 125/57 116/58  Pulse: 77 53  Temp:    Resp: 17 14    Filed Weights   05/27/15 1214 05/31/15 0500 06/01/15 0435  Weight: 79.5 kg (175 lb 4.3 oz) 86.6 kg (190 lb 14.7 oz) 87.9 kg (193 lb 12.6 oz)    ROS: Review of Systems  Respiratory: Positive for shortness of breath.   Cardiovascular: Negative for chest pain.  Gastrointestinal: Negative for abdominal pain.  Psychiatric/Behavioral: The patient is nervous/anxious.    Limited review of systems secondary to patient being on the ventilator  Exam: Physical Exam  HENT:  Nose: No mucosal edema.  Mouth/Throat: No oropharyngeal exudate or posterior oropharyngeal edema.  Eyes: Conjunctivae and lids are normal. Pupils are equal, round, and reactive to light.  Neck: No JVD present. Carotid bruit is not present. No edema present. No thyroid mass and no thyromegaly present.  Cardiovascular: S1 normal and S2 normal.  Exam reveals no gallop.   No murmur heard. Pulses:      Dorsalis pedis pulses are 2+ on the right side, and 2+ on the left side.  Respiratory: No accessory muscle usage. No respiratory distress. She has decreased breath sounds in the right lower field and the left lower field. She has no wheezes. She has rhonchi in the right middle field, the right lower field and the left lower field. She has no rales.  GI: Soft. Bowel sounds are normal. There is no tenderness.  Musculoskeletal:       Right ankle: She exhibits swelling.       Left ankle: She exhibits swelling.  Lymphadenopathy:    She has no cervical  adenopathy.  Neurological: She is alert.  Skin: Skin is warm. Nails show no clubbing.  Chronic lower extremity discoloration  Psychiatric: She has a normal mood and affect.    Data Reviewed: Basic Metabolic Panel:  Recent Labs Lab 05/27/15 0807 05/28/15 0458 05/29/15 0625 05/30/15 1151 05/31/15 0934 06/01/15 1525  NA 126* 133* 136 138 139 138  K 3.2* 5.0 3.9 4.2 5.0 3.6  CL 98* 104 104 104 107 106  CO2 20* GLUCOSE 577* 136* 93 105* 108* 179*  BUN 23*  CREATININE 0.62 0.66 0.50 0.54 0.54 0.55  CALCIUM 5.8* 8.3* 8.3* 8.5* 8.0* 8.0*  MG 1.2* 2.0  --  2.2  --   --   PHOS 2.4*  --   --   --   --   --    Liver Function Tests:  Recent Labs Lab 05/27/15 0807 05/28/15 0458 05/30/15 1151  AST 17 13* 13*  ALT 6* 8* 8*  ALKPHOS 102 113 120  BILITOT 2.0* 0.6 0.6  PROT 4.7* 5.7* 5.8*  ALBUMIN 2.1* 2.7* 2.8*   CBC:  Recent Labs Lab 05/27/15 0651 05/28/15 0CYRILLA [id number] 05/31/15 0934  WBC 18.0* 14.6* 9.7 8.9  HGB 14.2 11.6* 10.8* 12.0  HCT 43.0 35.0 33.5* 36.8  MCV 90.7 90.5 90.8 89.6  PLT 325 256 266 293  CBG:  Recent Labs Lab 05/31/15 1608 05/31/15 2010 06/01/15 0026 06/01/15 0412 06/01/15 0707  GLUCAP 141* 100* 109* 98 85    Recent Results (from the past 240 hour(s))  Blood culture (routine x 2)     Status: None   Collection Time: 05/27/15  6:51 AM  Result Value Ref Range Status   Specimen Description BLOOD  Final   Special Requests NONE  Final   Culture NO GROWTH 5 DAYS  Final   Report Status 06/01/2015 FINAL  Final  Urine culture     Status: None   Collection Time: 05/27/15  6:51 AM  Result Value Ref Range Status   Specimen Description URINE, CLEAN CATCH  Final   Special Requests NONE  Final   Culture NO GROWTH 2 DAYS  Final   Report Status 05/30/2015 FINAL  Final  Blood culture (routine x 2)     Status: None   Collection Time: 05/27/15  6:52 AM  Result Value Ref Range Status   Specimen Description BLOOD  RIGHT HAND  Final   Special Requests BOTTLES DRAWN AEROBIC AND ANAEROBIC  2CC  Final   Culture NO GROWTH 5 DAYS  Final   Report Status 06/01/2015 FINAL  Final  MRSA PCR Screening     Status: None   Collection Time: 05/27/15 12:27 PM  Result Value Ref Range Status   MRSA by PCR NEGATIVE NEGATIVE Final    Comment:        The GeneXpert MRSA Assay (FDA approved for NASAL specimens only), is one component of a comprehensive MRSA colonization surveillance program. It is not intended to diagnose MRSA infection nor to guide or monitor treatment for MRSA infections.   Culture, expectorated sputum-assessment     Status: None   Collection Time: 05/30/15  3:10 PM  Result Value Ref Range Status   Specimen Description EXPECTORATED SPUTUM  Final   Special Requests Normal  Final   Sputum evaluation THIS SPECIMEN IS ACCEPTABLE FOR SPUTUM CULTURE  Final   Report Status 05/30/2015 FINAL  Final  Culture, respiratory (NON-Expectorated)     Status: None   Collection Time: 05/30/15  3:10 PM  Result Value Ref Range Status   Specimen Description EXPECTORATED SPUTUM  Final   Special Requests Normal Reflexed from [id number]  Final   Gram Stain   Final    MODERATE WBC SEEN NO ORGANISMS SEEN GOOD SPECIMEN - 80-90% WBCS    Culture NO GROWTH 2 DAYS  Final   Report Status 06/01/2015 FINAL  Final     Studies: Dg Chest Port 1 View  05/31/2015  CLINICAL DATA:  80 year old female with respiratory failure. History of hypertension. EXAM: PORTABLE CHEST 1 VIEW COMPARISON:  Chest x-ray 05/29/2015. FINDINGS: An endotracheal tube is in place with tip 4.1 cm above the carina. A nasogastric tube is seen extending into the stomach, however, the tip of the nasogastric tube extends below the lower margin of the image. Lung volumes are low. Moderate left pleural effusion. Opacities throughout the left mid to lower lung, and in the right lung base, may reflect areas of atelectasis and/or airspace consolidation. Overall  appearance similar to the prior study. No evidence of pulmonary edema. Heart size is normal. Upper mediastinal contours are slightly distorted by patient positioning. IMPRESSION: 1. Support apparatus, as above. 2. Moderate left pleural effusion with areas of atelectasis and/or consolidation throughout the right lung base and left mid to lower lung, as above, similar to examination from 05/29/2015. Electronically Signed   By:  Trudie Reedaniel  Entrikin M.D.   On: 05/31/2015 14:12    Scheduled Meds: . acetylcysteine  2 mL Nebulization Q4H  . amLODipine  5 mg Oral Daily  . ampicillin-sulbactam (UNASYN) IV  3 g Intravenous Q6H  . antiseptic oral rinse  7 mL Mouth Rinse QID  . budesonide (PULMICORT) nebulizer solution  0.25 mg Nebulization BID  . chlorhexidine gluconate  15 mL Mouth Rinse BID  . clopidogrel  75 mg Per Tube Daily  . enoxaparin (LOVENOX) injection  40 mg Subcutaneous Q24H  . famotidine (PEPCID) IV  20 mg Intravenous Q12H  . feeding supplement (VITAL HIGH PROTEIN)  1,000 mL Per Tube Q24H  . fluticasone  2 spray Each Nare Daily  . free water  200 mL Per Tube 3 times per day  . hydrALAZINE  20 mg Intravenous Once  . insulin aspart  0-15 Units Subcutaneous 6 times per day  . ipratropium-albuterol  3 mL Nebulization Q4H  . miconazole  1 application Topical BID  . OLANZapine  2.5 mg Per Tube BID  . pravastatin  10 mg Per Tube QHS  . predniSONE  40 mg Oral Q breakfast  . rOPINIRole  0.75 mg Per Tube QPM  . traZODone  150 mg Per Tube QHS  . valproic acid  250 mg Oral TID  . [START ON 06/25/2015] Vitamin D (Ergocalciferol)  50,000 Units Oral Q30 days   Continuous Infusions: . sodium chloride 50 mL/hr at 06/01/15 1500    Assessment/Plan:  1. Clinical sepsis, aspiration pneumonia.- Patient is Unasyn. Patient is status post bronchoscopy removing [place] from right main stem bronchus. So far sputum culture is negative. Blood culture and urine culture negative 2. Acute respiratory failure with  hypoxia- Still with lots of secretions at this time and needs frequent suctioning. Continue ventilator support today and weaning process tomorrow. FiO2 was 28%. Continue daily weaning trials. 3. Hyperglycemia on presentation- sugars have improved 4. Hypocalcemia on presentation- this is also improved 5. Wheezing in lungs with history of COPD- nebulizer treatments, prednisone 6. History of stroke- on Plavix and pravastatin 7. Dysphasia- a safe diet for this patient will have to be figured out prior to discharge back to facility  8. Bradycardia on telemetry monitoring -when sleeping is normal. No further workup.   Code Status:     Code Status Orders        Start     Ordered   05/27/15 1226  Full code   Continuous     05/27/15 1226    Advance Directive Documentation        Most Recent Value   Type of Advance Directive  Healthcare Power of Attorney, Living will   Pre-existing out of facility DNR order (yellow form or pink MOST form)     "MOST" Form in Place?       Family Communication: Family at bedside Disposition Plan: Back to [facility] once stable.  Consultants:  Critical care specialist  Procedures:  Bronchoscopy  ET tube placement x2  Antibiotics:  Unasyn   Time spent: 25 minutes   Alford HighlandWIETING, Ronen Bromwell  [facility]

## 2015-06-01 NOTE — Consult Note (Signed)
Cardiology Consultation Note  Patient ID: [name] Stuart, MRN: [id number], DOB/AGE: 78/06/1935 78 y.o. Admit date: 05/27/2015   Date of Consult: 06/01/2015 Primary Physician: [facility] Primary Cardiologist: New to [facility]  Chief Complaint: Altered mental status Reason for Consult: Bradycardia  HPI: 80 y.o. female with h/o stroke with residual left sided-weakness/paralysis and dysphagia, chronic respiratory failure on prn home oxygen, dementia, HTN, HLD, anemia, RLS, hyponatremia, and depression who presented to [facility] on 11/23 with altered mental status and was found to have acute on chronic respiratory failure and sepsis 2/2 HCAP/aspiration PNA. She has subsequently developed sinus bradycardia while at rest with heart rates into the 30's to 50's, currently in sinus rhythm with heart rates in the 60's to 70's. Cardiology is consulted for further evaluation.    She has never seen a cardiologist previously and has no previously known cardiac history. She has had an echo done in September 2012 at the time of her stroke that showed EF > 55%. Right ventricle is grossly normal. Left atrial size is normal. Right atrial size is normal. There was mild concentric left ventricular hypertrophy. No pericardial effusion. No aortic regurgitation was present. She lives in a nursing home since her prior stroke 2 years ago given her left-sided weakness. She has been on Plavix since that time for her stroke and followed by her PCP. Her husband went to pick her up for Thanksgiving. She was at her baseline at that time. However, during the middle of the night she became SOB requiring increased nebulizer usage and had a subjective fever.   She was brought to [facility] on 11/23 for further evaluation. Upon her arrival on 11/26 she was found to be hypoxic and required BiPAP, and ultimately intubation to protect her airway. CXR showed right upper lobe PNA. CT chest confirmed right lower lobe bronchi with consolidation  in the lower lobe c/w PNA. Follow up CXR on 11/27 showed moderate left pleural effusion with areas of consolidation. She was extubated on 11/25, but required re-intubation on 11/26. On telemetry she has been bradycardic with heart rates in the 40's while sleeping. Upon walking/agitation her HR will increase to the 70's to 80's. She has been continued on ABX, inhalers, and steroids.     Past Medical History  Diagnosis Date  . Other specified rehabilitation procedure(V57.89)   . Unspecified constipation   . Esophageal reflux   . Chronic airway obstruction, not elsewhere classified   . Other specified disorder of skin   . Other symptoms involving skin and integumentary tissues   . Difficulty in walking(719.7)   . Muscle weakness (generalized)   . Dysphagia, oropharyngeal phase   . Cognitive communication deficit   . Anxiety state, unspecified   . Unspecified essential hypertension   . Unspecified late effects of cerebrovascular disease   . Depressive disorder, not elsewhere classified   . Other and unspecified hyperlipidemia   . Restless legs syndrome (RLS)   . Other chronic pain   . Insomnia, unspecified   . Pressure ulcer, buttock(707.05)   . Unspecified episodic mood disorder   . Stroke [facility])       Most Recent Cardiac Studies: Echo 03/2011: EF > 55%. Right ventricle is grossly normal. Left atrial size is normal. Right atrial size is normal. There was mild concentric left ventricular hypertrophy. No pericardial effusion. No aortic regurgitation was present.   Surgical History:  Past Surgical History  Procedure Laterality Date  . Cholecystectomy    . Bony pelvis surgery  Home Meds: Prior to Admission medications   Medication Sig Start Date End Date Taking? Authorizing Provider  acetaminophen (TYLENOL) 325 MG tablet Take 650 mg by mouth every 4 (four) hours as needed for mild pain, moderate pain or fever.    Yes Historical Provider, MD  albuterol (PROVENTIL) (2.5 MG/3ML)  0.083% nebulizer solution Take 2.5 mg by nebulization every 3 (three) hours as needed for wheezing or shortness of breath.   Yes Historical Provider, MD  ALPRAZolam (XANAX) 0.25 MG tablet Take 0.25 mg by mouth every 6 (six) hours as needed for anxiety.    Yes Historical Provider, MD  amLODipine (NORVASC) 2.5 MG tablet Take 2.5 mg by mouth daily.   Yes Historical Provider, MD  clopidogrel (PLAVIX) 75 MG tablet Take 75 mg by mouth daily.    Yes Historical Provider, MD  divalproex (DEPAKOTE ER) 250 MG 24 hr tablet Take 750 mg by mouth every evening.   Yes Historical Provider, MD  escitalopram (LEXAPRO) 10 MG tablet Take 10 mg by mouth daily.   Yes Historical Provider, MD  fluticasone (FLONASE) 50 MCG/ACT nasal spray Place 2 sprays into both nostrils daily.   Yes Historical Provider, MD  miconazole (MICOTIN) 2 % cream Apply 1 application topically 2 (two) times daily. To buttocks and sacrum   Yes Historical Provider, MD  nystatin (MYCOSTATIN/NYSTOP) 100000 UNIT/GM POWD Apply topically 2 (two) times daily. To groin   Yes Historical Provider, MD  OLANZapine (ZYPREXA) 2.5 MG tablet Take 2.5 mg by mouth 2 (two) times daily.   Yes Historical Provider, MD  omeprazole (PRILOSEC) 20 MG capsule Take 20 mg by mouth every morning.    Yes Historical Provider, MD  polyethylene glycol (MIRALAX / GLYCOLAX) packet Take 17 g by mouth daily as needed for mild constipation, moderate constipation or severe constipation.    Yes Historical Provider, MD  pravastatin (PRAVACHOL) 10 MG tablet Take 10 mg by mouth at bedtime.    Yes Historical Provider, MD  rOPINIRole (REQUIP) 0.25 MG tablet Take 0.75 mg by mouth every evening.   Yes Historical Provider, MD  traMADol (ULTRAM) 50 MG tablet Take 50 mg by mouth every 6 (six) hours as needed for moderate pain or severe pain.   Yes Historical Provider, MD  traZODone (DESYREL) 150 MG tablet Take 150 mg by mouth at bedtime.   Yes Historical Provider, MD  Vitamin D, Ergocalciferol,  (DRISDOL) 50000 UNITS CAPS capsule Take 50,000 Units by mouth every 30 (thirty) days. On the 22nd of each month   Yes Historical Provider, MD    Inpatient Medications:  . acetylcysteine  2 mL Nebulization Q4H  . amLODipine  5 mg Oral Daily  . ampicillin-sulbactam (UNASYN) IV  3 g Intravenous Q6H  . antiseptic oral rinse  7 mL Mouth Rinse QID  . budesonide (PULMICORT) nebulizer solution  0.25 mg Nebulization BID  . chlorhexidine gluconate  15 mL Mouth Rinse BID  . clopidogrel  75 mg Per Tube Daily  . enoxaparin (LOVENOX) injection  40 mg Subcutaneous Q24H  . famotidine (PEPCID) IV  20 mg Intravenous Q12H  . feeding supplement (VITAL HIGH PROTEIN)  1,000 mL Per Tube Q24H  . fluticasone  2 spray Each Nare Daily  . free water  200 mL Per Tube 3 times per day  . hydrALAZINE  20 mg Intravenous Once  . insulin aspart  0-15 Units Subcutaneous 6 times per day  . ipratropium-albuterol  3 mL Nebulization Q4H  . miconazole  1 application Topical BID  .  OLANZapine  2.5 mg Per Tube BID  . pravastatin  10 mg Per Tube QHS  . predniSONE  40 mg Oral Q breakfast  . rOPINIRole  0.75 mg Per Tube QPM  . traZODone  150 mg Per Tube QHS  . valproic acid  250 mg Oral TID  . [START ON 06/25/2015] Vitamin D (Ergocalciferol)  50,000 Units Oral Q30 days   . sodium chloride 50 mL/hr at 06/01/15 0715    Allergies:  Allergies  Allergen Reactions  . Aspirin   . Codeine Sulfate   . Ibuprofen   . Penicillins     Social History   Social History  . Marital Status: Married    Spouse Name: N/A  . Number of Children: N/A  . Years of Education: N/A   Occupational History  . Not on file.   Social History Main Topics  . Smoking status: Current Some Day Smoker -- 0.25 packs/day  . Smokeless tobacco: Not on file  . Alcohol Use: No  . Drug Use: Not on file  . Sexual Activity: Not on file   Other Topics Concern  . Not on file   Social History Narrative     Family History  Problem Relation Age of  Onset  . CAD Mother   . Brain cancer Father      Review of Systems: Review of Systems  Unable to perform ROS: intubated     Labs: No results for input(s): CKTOTAL, CKMB, TROPONINI in the last 72 hours. Lab Results  Component Value Date   WBC 8.9 05/31/2015   HGB 12.0 05/31/2015   HCT 36.8 05/31/2015   MCV 89.6 05/31/2015   PLT 293 05/31/2015    Recent Labs Lab 05/30/15 1151 05/31/15 0934  NA 138 139  K 4.2 5.0  CL 104 107  CO2 24 23  BUN 11 16  CREATININE 0.54 0.54  CALCIUM 8.5* 8.0*  PROT 5.8*  --   BILITOT 0.6  --   ALKPHOS 120  --   ALT 8*  --   AST 13*  --   GLUCOSE 105* 108*   Lab Results  Component Value Date   CHOL 166 07/26/2014   HDL 28* 07/26/2014   LDLCALC 82 07/26/2014   TRIG 129 05/29/2015   No results found for: DDIMER  Radiology/Studies:  Dg Chest 1 View  05/29/2015  CLINICAL DATA:  Intubation. EXAM: CHEST 1 VIEW COMPARISON:  05/28/2015 FINDINGS: Patient slightly rotated to the right. Endotracheal tube has tip 1.8 cm above the carina. Lungs are adequately inflated demonstrate bibasilar opacification left worse than right likely small effusions with associated atelectasis. Cannot exclude infection in the lung bases. Cardiomediastinal silhouette and remainder of the exam is unchanged. IMPRESSION: Bibasilar opacification left greater than right likely small effusions with atelectasis. Cannot exclude infection in the lung bases. Endotracheal tube with tip 1.8 cm above the carina. Electronically Signed   By: Elberta Fortis M.D.   On: 05/29/2015 19:54   Dg Chest 1 View  05/27/2015  CLINICAL DATA:  Encounter for intubation. EXAM: CHEST 1 VIEW COMPARISON:  Same day. FINDINGS: Stable cardiomediastinal silhouette. Endotracheal tube is in grossly good position with distal tip approximately 3.5 cm above the carina. Nasogastric tube is seen entering the stomach. No pneumothorax is noted. Mild bibasilar opacities are noted concerning for subsegmental atelectasis  or edema with possible minimal associated pleural effusions. IMPRESSION: Endotracheal and nasogastric tubes in grossly good position. Mild bibasilar opacities are noted concerning for edema or subsegmental atelectasis  with associated minimal pleural effusions. Electronically Signed   By: Lupita RaiderJames  Green Jr, M.D.   On: 05/27/2015 17:21   Dg Chest 2 View  05/03/2015  CLINICAL DATA:  Choked on food today. Presents with cough and congestion. EXAM: CHEST  2 VIEW COMPARISON:  07/30/2013 FINDINGS: The heart size and mediastinal contours are within normal limits. Both lungs are clear. The visualized skeletal structures are unremarkable. Small hiatal hernia noted. IMPRESSION: No active cardiopulmonary disease. Electronically Signed   By: Signa Kellaylor  Stroud M.D.   On: 05/03/2015 13:40   Dg Abd 1 View  05/29/2015  CLINICAL DATA:  OG tube placement EXAM: ABDOMEN - 1 VIEW COMPARISON:  Radiograph 05/27/2015 FINDINGS: NG tube extends stomach.  Tip is in the gastric antrum. IMPRESSION: NG tube with tip in the gastric antrum. Electronically Signed   By: Genevive BiStewart  Edmunds M.D.   On: 05/29/2015 21:07   Dg Abd 1 View  05/27/2015  CLINICAL DATA:  NG tube placement.  Initial encounter. EXAM: ABDOMEN - 1 VIEW COMPARISON:  Chest radiographs and CT same date. FINDINGS: 1708 hours. Nasogastric tube projects over the left upper quadrant of the abdomen, likely in the mid stomach. The visualized bowel gas pattern is normal. IMPRESSION: Nasogastric tube projects over the left upper quadrant of the abdomen, likely in the mid stomach. Electronically Signed   By: Carey BullocksWilliam  Veazey M.D.   On: 05/27/2015 17:19   Ct Chest W Contrast  05/27/2015  CLINICAL DATA:  Altered mental status and shortness of breath. Recent pneumonia. EXAM: CT CHEST WITH CONTRAST TECHNIQUE: Multidetector CT imaging of the chest was performed during intravenous contrast administration. CONTRAST:  75mL OMNIPAQUE IOHEXOL 350 MG/ML SOLN COMPARISON:  Chest radiograph  05/27/2015. FINDINGS: Mediastinum/Nodes: Mural thrombus is seen within the right common carotid artery, with associated luminal narrowing (series 2, image 1). Numerous mediastinal lymph nodes, none of which are pathologically enlarged. No hilar or axillary adenopathy. Atherosclerotic calcification of the arterial vasculature, including coronary arteries. Heart size normal. No pericardial effusion. Lungs/Pleura: Image quality is degraded by respiratory motion. Centrilobular emphysema. Scattered peribronchovascular nodularity bilaterally with areas of consolidation in the right lower lobe. Mild volume loss in the left lower lobe. No pleural fluid. Debris is seen within the right lower lobe bronchus (series 3, image 27), as well as within right lower lobe bronchi. Airway is suboptimally evaluated due to motion and expiratory phase imaging. Upper abdomen: Visualized portions of the liver, adrenal glands, left kidney, spleen, pancreas and stomach are grossly unremarkable. No upper abdominal adenopathy. Musculoskeletal: No worrisome lytic or sclerotic lesions. Degenerative changes are seen in the spine. There may be very mild compression of the inferior endplate of T8. IMPRESSION: 1. Debris in the right lower lobe bronchi with consolidation in the right lower lobe. Findings are indicative of pneumonia. Followup CT chest without contrast is recommended in 3-4 weeks following trial of antibiotic therapy to ensure resolution and exclude underlying malignancy. 2. Scattered peribronchovascular nodularity, also likely infectious. 3. Narrowing of the proximal right common carotid artery. 4. Coronary artery calcification. Electronically Signed   By: Leanna BattlesMelinda  Blietz M.D.   On: 05/27/2015 12:19   [place] Venous Img Lower Bilateral  05/28/2015  CLINICAL DATA:  Respiratory difficulty and bilateral leg swelling EXAM: BILATERAL LOWER EXTREMITY VENOUS DOPPLER ULTRASOUND TECHNIQUE: Gray-scale sonography with graded compression, as well as  color Doppler and duplex ultrasound were performed to evaluate the lower extremity deep venous systems from the level of the common femoral vein and including the common femoral, femoral, profunda  femoral, popliteal and calf veins including the posterior tibial, peroneal and gastrocnemius veins when visible. The superficial great saphenous vein was also interrogated. Spectral Doppler was utilized to evaluate flow at rest and with distal augmentation maneuvers in the common femoral, femoral and popliteal veins. COMPARISON:  None. FINDINGS: RIGHT LOWER EXTREMITY Common Femoral Vein: No evidence of thrombus. Normal compressibility, respiratory phasicity and response to augmentation. Saphenofemoral Junction: No evidence of thrombus. Normal compressibility and flow on color Doppler imaging. Profunda Femoral Vein: No evidence of thrombus. Normal compressibility and flow on color Doppler imaging. Femoral Vein: No evidence of thrombus. Normal compressibility, respiratory phasicity and response to augmentation. Popliteal Vein: No evidence of thrombus. Normal compressibility, respiratory phasicity and response to augmentation. Calf Veins: No evidence of thrombus. Normal compressibility and flow on color Doppler imaging. Superficial Great Saphenous Vein: No evidence of thrombus. Normal compressibility and flow on color Doppler imaging. Venous Reflux:  None. Other Findings:  None. LEFT LOWER EXTREMITY Common Femoral Vein: No evidence of thrombus. Normal compressibility, respiratory phasicity and response to augmentation. Saphenofemoral Junction: No evidence of thrombus. Normal compressibility and flow on color Doppler imaging. Profunda Femoral Vein: No evidence of thrombus. Normal compressibility and flow on color Doppler imaging. Femoral Vein: No evidence of thrombus. Normal compressibility, respiratory phasicity and response to augmentation. Popliteal Vein: No evidence of thrombus. Normal compressibility, respiratory  phasicity and response to augmentation. Calf Veins: No evidence of thrombus. Normal compressibility and flow on color Doppler imaging. Superficial Great Saphenous Vein: No evidence of thrombus. Normal compressibility and flow on color Doppler imaging. Venous Reflux:  None. Other Findings:  None. IMPRESSION: No evidence of deep venous thrombosis. Electronically Signed   By: Alcide Clever M.D.   On: 05/28/2015 08:29   Dg Chest Port 1 View  05/31/2015  CLINICAL DATA:  80 year old female with respiratory failure. History of hypertension. EXAM: PORTABLE CHEST 1 VIEW COMPARISON:  Chest x-ray 05/29/2015. FINDINGS: An endotracheal tube is in place with tip 4.1 cm above the carina. A nasogastric tube is seen extending into the stomach, however, the tip of the nasogastric tube extends below the lower margin of the image. Lung volumes are low. Moderate left pleural effusion. Opacities throughout the left mid to lower lung, and in the right lung base, may reflect areas of atelectasis and/or airspace consolidation. Overall appearance similar to the prior study. No evidence of pulmonary edema. Heart size is normal. Upper mediastinal contours are slightly distorted by patient positioning. IMPRESSION: 1. Support apparatus, as above. 2. Moderate left pleural effusion with areas of atelectasis and/or consolidation throughout the right lung base and left mid to lower lung, as above, similar to examination from 05/29/2015. Electronically Signed   By: Trudie Reed M.D.   On: 05/31/2015 14:12   Dg Chest Port 1 View  05/28/2015  CLINICAL DATA:  Respiratory failure EXAM: PORTABLE CHEST - 1 VIEW COMPARISON:  05/27/2015 FINDINGS: Endotracheal tube and nasogastric catheter are again identified and stable. The cardiac shadow is unchanged. Aortic calcifications are again noted. The lungs are incompletely aerated. Increasing left basilar atelectasis is noted. Stable right basilar atelectasis is noted as well. IMPRESSION: Slight  increase in the degree of left basilar atelectasis. Electronically Signed   By: Alcide Clever M.D.   On: 05/28/2015 08:17   Dg Chest Portable 1 View  05/27/2015  CLINICAL DATA:  Severe shortness of breath beginning yesterday. Fever and cough. COPD. EXAM: PORTABLE CHEST 1 VIEW COMPARISON:  05/03/2015 FINDINGS: Heart size remains stable. Ectasia of the thoracic aorta  is unchanged. No evidence of acute infiltrate, pleural effusion, or pneumothorax. New irregular nodular opacity is seen in the right upper lobe. This was not seen on previous study and small bronchogenic carcinoma cannot be excluded. IMPRESSION: Right upper lobe nodular opacity. Chest CT with contrast recommended to exclude small bronchogenic carcinoma. Electronically Signed   By: Myles Rosenthal M.D.   On: 05/27/2015 07:31    EKG: NSR with marked sinus arrhythmia, 62 bpm, low voltage QRS, prolonged QTc 503 msec, poor R wave progression, nonspecific st/t changes   Weights: Filed Weights   05/27/15 1214 05/31/15 0500 06/01/15 0435  Weight: 175 lb 4.3 oz (79.5 kg) 190 lb 14.7 oz (86.6 kg) 193 lb 12.6 oz (87.9 kg)     Physical Exam: Blood pressure 140/59, pulse 106, temperature 97.9 F (36.6 C), temperature source Axillary, resp. rate 16, height 5\' 6"  (1.676 m), weight 193 lb 12.6 oz (87.9 kg), SpO2 96 %. Body mass index is 31.29 kg/(m^2). General: Well developed, well nourished, intubated. Head: Normocephalic, atraumatic, sclera non-icteric, no xanthomas, nares are without discharge.  Neck: Negative for carotid bruits. JVD not elevated. Lungs: Decreased breath sounds bilaterally. Intubated. Heart: RRR, with S1 S2. No murmurs, rubs, or gallops appreciated. Abdomen: Soft, non-tender, non-distended with normoactive bowel sounds. No hepatomegaly. No rebound/guarding. No obvious abdominal masses. Msk:  Strength and tone appear normal for age. Extremities: No clubbing or cyanosis. 1+ pre-tibial edema.  Distal pedal pulses are 1+ and equal  bilaterally. Neuro: Intubated. Psych:  Intubated.    Assessment and Plan:   1. Bradycardia: -K+ from 11/27 was 5.0, which was trending up from 4.2, will recheck a bmet stat as this could be playing a role in her bradycardia, no bmet today -If her potassium continues to trend upwards this may need to be treated given her symptoms -With agitation her heart rate will improve from the 40's resting to the mid 60's, upon waking heart rate will improve to 80's this is reassuring  -Check echo to evaluate LV function, wall motion, and right-sided pressure -Possibly underlying sleep apnea -Patient would benefit from outpatient sleep study  2. Acute respiratory failure with hypoxia: -In the setting of # 3 -Wean intubation as tolerated per PCCM  3. Sepsis in the setting of HCAP/aspiration PNA: -Bronchoscopy found peas in the right main stem bronchus  -On ABX per IM and PCCM  4. History of stroke: -Continue outpatient Plavix   5. Hypotension: -Likely in the setting of the above -Add pressors as needed -MAP of 65  6. Pleural effusion: -Soft BP precludes usage of Lasix at this time -As above   Elinor Dodge, PA-C Pager: [phone] 06/01/2015, 2:04 PM

## 2015-06-01 NOTE — Progress Notes (Signed)
Speech Therapy Note: reviewed chart notes; concern for aspiration occuring. Pt remains on Vent at this time. ST will f/u tomorrow w/ pt's status.

## 2015-06-02 ENCOUNTER — Inpatient Hospital Stay: Payer: Medicare Other

## 2015-06-02 LAB — BLOOD GAS, ARTERIAL
ACID-BASE EXCESS: 3.5 mmol/L — AB (ref 0.0–3.0)
ALLENS TEST (PASS/FAIL): POSITIVE — AB
Acid-Base Excess: 2.1 mmol/L (ref 0.0–3.0)
Allens test (pass/fail): POSITIVE — AB
BICARBONATE: 27.8 meq/L (ref 21.0–28.0)
Bicarbonate: 24.7 mEq/L (ref 21.0–28.0)
FIO2: 0.28
FIO2: 0.28
Mechanical Rate: 12
O2 SAT: 93.6 %
O2 SAT: 96.2 %
PATIENT TEMPERATURE: 37
PEEP/CPAP: 5 cmH2O
PEEP/CPAP: 5 cmH2O
PRESSURE SUPPORT: 5 cmH2O
Patient temperature: 37
VT: 480 mL
pCO2 arterial: 40 mmHg (ref 32.0–48.0)
pCO2 arterial: 31 mmHg — ABNORMAL LOW (ref 32.0–48.0)
pH, Arterial: 7.45 (ref 7.350–7.450)
pH, Arterial: 7.51 — ABNORMAL HIGH (ref 7.350–7.450)
pO2, Arterial: 66 mmHg — ABNORMAL LOW (ref 83.0–108.0)
pO2, Arterial: 75 mmHg — ABNORMAL LOW (ref 83.0–108.0)

## 2015-06-02 LAB — GLUCOSE, CAPILLARY
GLUCOSE-CAPILLARY: 98 mg/dL (ref 65–99)
GLUCOSE-CAPILLARY: 142 mg/dL — AB (ref 65–99)
GLUCOSE-CAPILLARY: 128 mg/dL — AB (ref 65–99)
Glucose-Capillary: 104 mg/dL — ABNORMAL HIGH (ref 65–99)
Glucose-Capillary: 131 mg/dL — ABNORMAL HIGH (ref 65–99)

## 2015-06-02 LAB — BASIC METABOLIC PANEL
Anion gap: 7 (ref 5–15)
BUN: 25 mg/dL — ABNORMAL HIGH (ref 6–20)
CHLORIDE: 109 mmol/L (ref 101–111)
CO2: 26 mmol/L (ref 22–32)
Calcium: 8.1 mg/dL — ABNORMAL LOW (ref 8.9–10.3)
Creatinine, Ser: 0.57 mg/dL (ref 0.44–1.00)
GFR calc non Af Amer: >60 mL/min (ref >60)
Glucose, Bld: 115 mg/dL — ABNORMAL HIGH (ref 65–99)
POTASSIUM: 3 mmol/L — AB (ref 3.5–5.1)
SODIUM: 142 mmol/L (ref 135–145)

## 2015-06-02 LAB — CBC
HCT: 34 % — ABNORMAL LOW (ref 35.0–47.0)
HEMOGLOBIN: 11.3 g/dL — AB (ref 12.0–16.0)
MCH: 30.2 pg (ref 26.0–34.0)
MCHC: 33.3 g/dL (ref 32.0–36.0)
MCV: 90.7 fL (ref 80.0–100.0)
Platelets: 244 10*3/uL (ref 150–440)
RBC: 3.75 MIL/uL — AB (ref 3.80–5.20)
RDW: 14.6 % — ABNORMAL HIGH (ref 11.5–14.5)
WBC: 9 10*3/uL (ref 3.6–11.0)

## 2015-06-02 MED ORDER — POTASSIUM CHLORIDE 20 MEQ/15ML (10%) PO SOLN
40.0000 meq | Freq: Once | ORAL | Status: AC
  Administered 2015-06-02: 40 meq
  Filled 2015-06-02: qty 30

## 2015-06-02 MED ORDER — POTASSIUM CHLORIDE 10 MEQ/100ML IV SOLN
10.0000 meq | INTRAVENOUS | Status: AC
  Administered 2015-06-02 (×4): 10 meq via INTRAVENOUS
  Filled 2015-06-02 (×4): qty 100

## 2015-06-02 NOTE — Progress Notes (Signed)
Speech Therapy Note: reviewed chart notes; discussion in rounds w/ MD this AM. Pt remains on Vent at this time w/ possible weaning today per MD. ST will f/u tomorrow w/ pt's status.

## 2015-06-02 NOTE — Progress Notes (Signed)
NG tube placed in right nare. KUB ordered. I have explained to patient multiple times she cannot have anything to eat or drink and she continues to ask for food and drink.

## 2015-06-02 NOTE — Progress Notes (Signed)
Spoke with pt.'s husband, Gary FleetBill Callies and gave him an update.  Mr. Okey DupreCrawford asked about whether the "sonogram" was done of the pt.'s heart.  I looked through the results review tab and did not find any procedures that had been done today.  Told pt. That I will pass this along in report and that we were not planning any procedures for the rest of the evening.

## 2015-06-02 NOTE — Progress Notes (Signed)
Dr. Dema SeverinMungal gave the approval to use NG

## 2015-06-02 NOTE — Progress Notes (Signed)
[facility] Medicine Progess Note    ASSESSMENT/PLAN   59F NH resident X several years after R hemispheric CVA with prior aspiration PNAs admitted via ED with acute respiratory distress and hypoxemia. Intubated 11/23 for  FOB which revealed aspirated food.   PULMONARY A: Acute respiratory failure; yesterday, the patient had thick copious secretions. These have improved significantly today.  H/O recurrent aspirations R mainstem bronchus lesion - aspirated foreign body/material P:  Weaning trial today, extubate if tolerated. Continue antibiotics for aspiration coverage, course will end on December 2. Prednisone  for another 1 days, then taper. Vent bundle implemented    CARDIOVASCULAR A:  Bradycardia P:  Monitor hemodynamics Hold Requip for now, continue to monitor.  RENAL A:  Hypokalemia - improving P:  Monitor BMET intermittently Monitor I/Os Correct electrolytes as indicated IVFs adjusted 11/23  GASTROINTESTINAL A:  Suspected dysphagia P:  SUP: IV famotidine Consider SLP eval before oral nutrition  HEMATOLOGIC A:  Asymmetric LE edema P:  DVT px: Enoxaparin Monitor CBC intermittently Transfuse per usual ICU guidelines   INFECTIOUS A:  Aspiration PNA P:  Monitor temp, WBC count Micro and abx as above  Unasyn 05/29/15>> Sputum 05/30/15; pending.  Bld Cx, 05/27/15; negative.  Urine Cx; 05/27/15; negative.    CXR 05/31/15; small pleural effusion, left; bilat. Infiltrates.   ENDOCRINE A:  Severe hyperglycemia without prior dx of DM, blood glucose now better controlled with light scale insulin. -Patient is on steroids.  P:  SSI ordered, to be continued.  NEUROLOGIC A:  Prior CVA Chronic debilitation Chronic depression P:  RASS goal: -1, 0 PAD protocol Avoid over sedation  PSYCH Hx of Bipolar - resides at [facility], may need to address psych/sedative meds after extubation, given high  aspiration risk.   ---------------------------------------   ----------------------------------------   Name: FRANK PILGER MRN: [id number] DOB: May 30, 1935    ADMISSION DATE:  05/27/2015    CHIEF COMPLAINT: Dyspnea   SUBJECTIVE/ review of systems:   Pt currently on the ventilator, can not provide history or review of systems. Case discussed with patient's husband was at bedside.    VITAL SIGNS: Temp:  [96.8 F (36 C)-98.2 F (36.8 C)] 97.5 F (36.4 C) (11/29 1100) Pulse Rate:  [35-86] 84 (11/29 1100) Resp:  [13-23] 19 (11/29 1100) BP: (100-154)/(42-109) 124/70 mmHg (11/29 1100) SpO2:  [89 %-100 %] 95 % (11/29 1100) FiO2 (%):  [28 %] 28 % (11/29 1210) Weight:  [89.2 kg (196 lb 10.4 oz)] 89.2 kg (196 lb 10.4 oz) (11/29 0416) HEMODYNAMICS:   VENTILATOR SETTINGS: Vent Mode:  [-] Spontaneous FiO2 (%):  [28 %] 28 % Set Rate:  [12 bmp] 12 bmp Vt Set:  [480 mL] 480 mL PEEP:  [5 cmH20] 5 cmH20 Pressure Support:  [5 cmH20] 5 cmH20 Plateau Pressure:  [10 cmH20] 10 cmH20 INTAKE / OUTPUT:  Intake/Output Summary (Last 24 hours) at 06/02/15 1334 Last data filed at 06/02/15 1200  Gross per 24 hour  Intake 4277.5 ml  Output   1306 ml  Net 2971.5 ml    PHYSICAL EXAMINATION: Physical Examination:   VS: BP 124/70 mmHg  Pulse 84  Temp(Src) 97.5 F (36.4 C) (Other (Comment))  Resp 19  Ht  (1.676 m)  Wt 89.2 kg (196 lb 10.4 oz)  BMI 31.76 kg/m2  SpO2 95%  General Appearance: No distress  Neuro:without focal findings, mental status normal. HEENT: PERRLA, EOM intact. Pulmonary: normal breath sounds   CardiovascularNormal S1,S2.  No m/r/g.  Abdomen: Benign, Soft, non-tender. Renal:  No costovertebral tenderness  GU:  Not performed at this time. Endocrine: No evident thyromegaly. Skin:   warm, no rashes, no ecchymosis  Extremities: normal, no cyanosis, clubbing.   LABS:   LABORATORY PANEL:   CBC  Recent Labs Lab 06/02/15 0439  WBC 9.0  HGB 11.3*   HCT 34.0*  PLT 244    Chemistries   Recent Labs Lab 05/27/15 0807  05/30/15 1151  06/02/15 0439  NA 126*  < > 138  < > 142  K 3.2*  < > 4.2  < > 3.0*  CL 98*  < > 104  < > 109  CO2 20*  < > 24  < > 26  GLUCOSE 577*  < > 105*  < > 115*  BUN 8  < > 11  < > 25*  CREATININE 0.62  < > 0.54  < > 0.57  CALCIUM 5.8*  < > 8.5*  < > 8.1*  MG 1.2*  < > 2.2  --   --   PHOS 2.4*  --   --   --   --   AST 17  < > 13*  --   --   ALT 6*  < > 8*  --   --   ALKPHOS 102  < > 120  --   --   BILITOT 2.0*  < > 0.6  --   --   < > = values in this interval not displayed.   Recent Labs Lab 06/01/15 1623 06/01/15 2001 06/01/15 2332 06/02/15 0411 06/02/15 0711 06/02/15 1114  GLUCAP 152* 130* 91 104* 98 131*    Recent Labs Lab 05/31/15 1300 06/02/15 0525 06/02/15 1110  PHART 7.48* 7.45 7.51*  PCO2ART 38 40 31*  PO2ART 68* 66* 75*    Recent Labs Lab 05/27/15 0807 05/28/15 0458 05/30/15 1151  AST 17 13* 13*  ALT 6* 8* 8*  ALKPHOS 102 113 120  BILITOT 2.0* 0.6 0.6  ALBUMIN 2.1* 2.7* 2.8*    Cardiac Enzymes No results for input(s): TROPONINI in the last 168 hours.  RADIOLOGY:  Dg Chest 1 View  06/02/2015  CLINICAL DATA:  Dyspnea. EXAM: CHEST 1 VIEW COMPARISON:  May 31, 2015. FINDINGS: Endotracheal and nasogastric tubes are unchanged in position. No pneumothorax is noted. Stable cardiomegaly and central pulmonary vascular congestion. Left lung opacity is significantly improved compared to prior exam, with mild residual atelectasis or edema seen in mild residual left pleural effusion. Bony thorax is unremarkable. Improved right basilar opacity is also noted most consistent with improved edema or atelectasis. IMPRESSION: Improved bilateral lung opacities are noted consistent with improving atelectasis or edema, with mild residual left pleural effusion. Stable support apparatus. Electronically Signed   By: Lupita Raider, M.D.   On: 06/02/2015 07:46       --Wells Guiles, MD.  [facility]   Santiago Glad, M.D.  Stephanie Acre, M.D.  Billy Fischer, M.D  Critical Care Attestation.  I have personally obtained a history, examined the patient, evaluated laboratory and imaging results, formulated the assessment and plan and placed orders.the case was discussed with the critical care RN, the unit manager, respiratory therapist, nutrition, ICU pharmacist. The Patient requires high complexity decision making for assessment and support, frequent evaluation and titration of therapies, application of advanced monitoring technologies and extensive interpretation of multiple databases. The patient has critical illness that could lead imminently to failure of 1 or more organ  systems and requires the highest level of physician preparedness to intervene.  Critical Care Time devoted to patient care services described in this note is 35 minutes and is exclusive of time spent in procedures.

## 2015-06-02 NOTE — Progress Notes (Signed)
Patient complained of headache and requested tramadol

## 2015-06-02 NOTE — Progress Notes (Signed)
eLink Physician-Brief Progress Note Patient Name: Katheren ShamsGlera B Olguin DOB: 1934-07-22 MRN: [id number]   Date of Service  06/02/2015  HPI/Events of Note    eICU Interventions  Hypokalemia -repleted      Intervention Category Intermediate Interventions: Electrolyte abnormality - evaluation and management  Lilymarie Scroggins V. 06/02/2015, 5:53 AM

## 2015-06-02 NOTE — Progress Notes (Signed)
Speech Therapy Note: NSG contacted SLP to update pt's staus; pt was just extubated after lunch and is tolerating [place] O2 support at this time. Pt will have NG placed for meds/nutrition per NSG. Will schedule for MBSS tomorrow now that pt is extubated sec. to pt's h/o dysphagia/aspiration. Rec. Oral care for tonight.

## 2015-06-02 NOTE — Progress Notes (Signed)
ANTIBIOTIC CONSULT NOTE - Follow Up  Pharmacy Consult for Unasyn  Indication: pneumonia  Allergies  Allergen Reactions  . Aspirin   . Codeine Sulfate   . Ibuprofen   . Penicillins    Patient Measurements: Height:  (167.6 cm) Weight: 196 lb 10.4 oz (89.2 kg) IBW/kg (Calculated) : 59.3   Vital Signs: Temp: 97.3 F (36.3 C) (11/29 0700) Temp Source: Other (Comment) (11/29 0700) BP: 138/64 mmHg (11/29 0700) Pulse Rate: 64 (11/29 0700)  Labs:  Recent Labs  05/31/15 0934 06/01/15 1525 06/02/15 0439  WBC 8.9  --  9.0  HGB 12.0  --  11.3*  PLT 293  --  244  CREATININE 0.54 0.55 0.57   Estimated Creatinine Clearance: 65.2 mL/min (by C-G formula based on Cr of 0.57).  Microbiology: Recent Results (from the past 720 hour(s))  Blood culture (routine x 2)     Status: None   Collection Time: 05/27/15  6:51 AM  Result Value Ref Range Status   Specimen Description BLOOD  Final   Special Requests NONE  Final   Culture NO GROWTH 5 DAYS  Final   Report Status 06/01/2015 FINAL  Final  Urine culture     Status: None   Collection Time: 05/27/15  6:51 AM  Result Value Ref Range Status   Specimen Description URINE, CLEAN CATCH  Final   Special Requests NONE  Final   Culture NO GROWTH 2 DAYS  Final   Report Status 05/30/2015 FINAL  Final  Blood culture (routine x 2)     Status: None   Collection Time: 05/27/15  6:52 AM  Result Value Ref Range Status   Specimen Description BLOOD RIGHT HAND  Final   Special Requests BOTTLES DRAWN AEROBIC AND ANAEROBIC  2CC  Final   Culture NO GROWTH 5 DAYS  Final   Report Status 06/01/2015 FINAL  Final  MRSA PCR Screening     Status: None   Collection Time: 05/27/15 12:27 PM  Result Value Ref Range Status   MRSA by PCR NEGATIVE NEGATIVE Final    Comment:        The GeneXpert MRSA Assay (FDA approved for NASAL specimens only), is one component of a comprehensive MRSA colonization surveillance program. It is not intended to diagnose  MRSA infection nor to guide or monitor treatment for MRSA infections.   Culture, expectorated sputum-assessment     Status: None   Collection Time: 05/30/15  3:10 PM  Result Value Ref Range Status   Specimen Description EXPECTORATED SPUTUM  Final   Special Requests Normal  Final   Sputum evaluation THIS SPECIMEN IS ACCEPTABLE FOR SPUTUM CULTURE  Final   Report Status 05/30/2015 FINAL  Final  Culture, respiratory (NON-Expectorated)     Status: None   Collection Time: 05/30/15  3:10 PM  Result Value Ref Range Status   Specimen Description EXPECTORATED SPUTUM  Final   Special Requests Normal Reflexed from [id number]  Final   Gram Stain   Final    MODERATE WBC SEEN NO ORGANISMS SEEN GOOD SPECIMEN - 80-90% WBCS    Culture NO GROWTH 2 DAYS  Final   Report Status 06/01/2015 FINAL  Final    Assessment: 80 yo female being treated for aspiration PNA. Pharmacy consulted for Unasyn dosing and monitoring. Patient was started on meropenem and vanc on admission 11/23, Vancomycin was D'cd 11/24, meropenem D'cd during AM rounds. Unknown PCN allergy, tolerated meropenem and no ADRs seen with administration of Unasyn.   Plan:  Will continue patient on Unasyn 3g q6H. Stop date for Unasyn is 12/2.  Pharmacy will continue to monitor renal function and labs and make adjustments as needed.   Cher NakaiSheema Kharma Sampsel, PharmD Pharmacy Resident 06/02/2015 9:29 AM

## 2015-06-02 NOTE — Progress Notes (Signed)
Patient ID: [name] Stuart, female   DOB: [birth year], 80 y.o.   MRN: [id number] [facility] PROGRESS NOTE  PCP: [facility]   HPI/Subjective: Patient feeling much better since being taken off the ventilator. Some cough. Not as much secretions. Able to swallow her own saliva.  Objective: Filed Vitals:   06/02/15 1000 06/02/15 1100  BP: 119/42 124/70  Pulse: 66 84  Temp: 97.2 F (36.2 C) 97.5 F (36.4 C)  Resp: 18 19    Filed Weights   05/31/15 0500 06/01/15 0435 06/02/15 0416  Weight: 86.6 kg (190 lb 14.7 oz) 87.9 kg (193 lb 12.6 oz) 89.2 kg (196 lb 10.4 oz)    ROS: Review of Systems  Constitutional: Negative for fever and chills.  Eyes: Negative for blurred vision.  Respiratory: Positive for cough. Negative for shortness of breath.   Cardiovascular: Negative for chest pain.  Gastrointestinal: Negative for nausea, vomiting, abdominal pain, diarrhea and constipation.  Genitourinary: Negative for dysuria.  Musculoskeletal: Negative for joint pain.  Neurological: Negative for dizziness and headaches.  Psychiatric/Behavioral: The patient is not nervous/anxious.     Exam: Physical Exam  HENT:  Nose: No mucosal edema.  Mouth/Throat: No oropharyngeal exudate or posterior oropharyngeal edema.  Eyes: Conjunctivae and lids are normal. Pupils are equal, round, and reactive to light.  Neck: No JVD present. Carotid bruit is not present. No edema present. No thyroid mass and no thyromegaly present.  Cardiovascular: S1 normal and S2 normal.  Exam reveals no gallop.   No murmur heard. Pulses:      Dorsalis pedis pulses are 2+ on the right side, and 2+ on the left side.  Respiratory: No accessory muscle usage. No respiratory distress. She has no decreased breath sounds. She has no wheezes. She has rhonchi in the right lower field. She has no rales.  GI: Soft. Bowel sounds are normal. There is no tenderness.  Musculoskeletal:       Right ankle: She exhibits  swelling.       Left ankle: She exhibits swelling.  Lymphadenopathy:    She has no cervical adenopathy.  Neurological: She is alert.  Skin: Skin is warm. Nails show no clubbing.  Chronic lower extremity discoloration  Psychiatric: She has a normal mood and affect.    Data Reviewed: Basic Metabolic Panel:  Recent Labs Lab 05/27/15 0807 05/28/15 0458 05/29/15 0625 05/30/15 1151 05/31/15 0934 06/01/15 1525 06/02/15 0439  NA 126* 133* 136 138 139 138 142  K 3.2* 5.0 3.9 4.2 5.0 3.6 3.0*  CL 98* 104 104 104 107 106 109  CO2 20* GLUCOSE 577* 136* 93 105* 108* 179* 115*  BUN 23* 25*  CREATININE 0.62 0.66 0.50 0.54 0.54 0.55 0.57  CALCIUM 5.8* 8.3* 8.3* 8.5* 8.0* 8.0* 8.1*  MG 1.2* 2.0  --  2.2  --   --   --   PHOS 2.4*  --   --   --   --   --   --    Liver Function Tests:  Recent Labs Lab 05/27/15 0807 05/28/15 0458 05/30/15 1151  AST 17 13* 13*  ALT 6* 8* 8*  ALKPHOS 102 113 120  BILITOT 2.0* 0.6 0.6  PROT 4.7* 5.7* 5.8*  ALBUMIN 2.1* 2.7* 2.8*   CBC:  Recent Labs Lab 05/27/15 0651 05/28/15 0458 05/29/15 0625 05/31/15 0934 06/02/15 0439  WBC 18.0* 14.6* 9.7 8.9 9.0  HGB 14.2 11.6* 10.8* 12.0  11.3*  HCT 43.0 35.0 33.5* 36.8 34.0*  MCV 90.7 90.5 90.8 89.6 90.7  PLT 325 256 266 293 244    CBG:  Recent Labs Lab 06/01/15 2001 06/01/15 2332 06/02/15 0411 06/02/15 0711 06/02/15 1114  GLUCAP 130* 91 104* 98 131*    Recent Results (from the past 240 hour(s))  Blood culture (routine x 2)     Status: None   Collection Time: 05/27/15  6:51 AM  Result Value Ref Range Status   Specimen Description BLOOD  Final   Special Requests NONE  Final   Culture NO GROWTH 5 DAYS  Final   Report Status 06/01/2015 FINAL  Final  Urine culture     Status: None   Collection Time: 05/27/15  6:51 AM  Result Value Ref Range Status   Specimen Description URINE, CLEAN CATCH  Final   Special Requests NONE  Final   Culture NO GROWTH 2 DAYS   Final   Report Status 05/30/2015 FINAL  Final  Blood culture (routine x 2)     Status: None   Collection Time: 05/27/15  6:52 AM  Result Value Ref Range Status   Specimen Description BLOOD RIGHT HAND  Final   Special Requests BOTTLES DRAWN AEROBIC AND ANAEROBIC  2CC  Final   Culture NO GROWTH 5 DAYS  Final   Report Status 06/01/2015 FINAL  Final  MRSA PCR Screening     Status: None   Collection Time: 05/27/15 12:27 PM  Result Value Ref Range Status   MRSA by PCR NEGATIVE NEGATIVE Final    Comment:        The GeneXpert MRSA Assay (FDA approved for NASAL specimens only), is one component of a comprehensive MRSA colonization surveillance program. It is not intended to diagnose MRSA infection nor to guide or monitor treatment for MRSA infections.   Culture, expectorated sputum-assessment     Status: None   Collection Time: 05/30/15  3:10 PM  Result Value Ref Range Status   Specimen Description EXPECTORATED SPUTUM  Final   Special Requests Normal  Final   Sputum evaluation THIS SPECIMEN IS ACCEPTABLE FOR SPUTUM CULTURE  Final   Report Status 05/30/2015 FINAL  Final  Culture, respiratory (NON-Expectorated)     Status: None   Collection Time: 05/30/15  3:10 PM  Result Value Ref Range Status   Specimen Description EXPECTORATED SPUTUM  Final   Special Requests Normal Reflexed from [id number]  Final   Gram Stain   Final    MODERATE WBC SEEN NO ORGANISMS SEEN GOOD SPECIMEN - 80-90% WBCS    Culture NO GROWTH 2 DAYS  Final   Report Status 06/01/2015 FINAL  Final     Studies: Dg Chest 1 View  06/02/2015  CLINICAL DATA:  Dyspnea. EXAM: CHEST 1 VIEW COMPARISON:  May 31, 2015. FINDINGS: Endotracheal and nasogastric tubes are unchanged in position. No pneumothorax is noted. Stable cardiomegaly and central pulmonary vascular congestion. Left lung opacity is significantly improved compared to prior exam, with mild residual atelectasis or edema seen in mild residual left pleural effusion.  Bony thorax is unremarkable. Improved right basilar opacity is also noted most consistent with improved edema or atelectasis. IMPRESSION: Improved bilateral lung opacities are noted consistent with improving atelectasis or edema, with mild residual left pleural effusion. Stable support apparatus. Electronically Signed   By: Lupita RaiderJames  Green Jr, M.D.   On: 06/02/2015 07:46    Scheduled Meds: . acetylcysteine  2 mL Nebulization Q4H  . amLODipine  5 mg Oral  Daily  . ampicillin-sulbactam (UNASYN) IV  3 g Intravenous Q6H  . antiseptic oral rinse  7 mL Mouth Rinse QID  . budesonide (PULMICORT) nebulizer solution  0.25 mg Nebulization BID  . chlorhexidine gluconate  15 mL Mouth Rinse BID  . clopidogrel  75 mg Per Tube Daily  . enoxaparin (LOVENOX) injection  40 mg Subcutaneous Q24H  . famotidine (PEPCID) IV  20 mg Intravenous Q12H  . feeding supplement (VITAL HIGH PROTEIN)  1,000 mL Per Tube Q24H  . fluticasone  2 spray Each Nare Daily  . free water  200 mL Per Tube 3 times per day  . hydrALAZINE  20 mg Intravenous Once  . insulin aspart  0-15 Units Subcutaneous 6 times per day  . ipratropium-albuterol  3 mL Nebulization Q4H  . miconazole  1 application Topical BID  . OLANZapine  2.5 mg Per Tube BID  . pravastatin  10 mg Per Tube QHS  . rOPINIRole  0.75 mg Per Tube QPM  . traZODone  150 mg Per Tube QHS  . valproic acid  250 mg Oral TID  . [START ON 06/25/2015] Vitamin D (Ergocalciferol)  50,000 Units Oral Q30 days   Continuous Infusions: . sodium chloride 50 mL/hr at 06/02/15 0600    Assessment/Plan:  1. Clinical sepsis, aspiration pneumonia.- Patient is Unasyn. Patient is status post bronchoscopy removing [place] from right main stem bronchus. All cultures are negative. 2. Acute respiratory failure with hypoxia- extubated for second time 06/02/2015. Breathing comfortably on nasal cannula. 3. Hyperglycemia on presentation- sugars have improved 4. Hypocalcemia on presentation- this is also  improved 5. Wheezing in lungs with history of COPD- nebulizer treatments, prednisone 6. History of stroke- on Plavix and pravastatin 7. Dysphasia- speech therapy to do a modified barium swallow prior to discharge 8. Bradycardia on telemetry monitoring -when sleeping is normal. No further workup.   Code Status:     Code Status Orders        Start     Ordered   05/27/15 1226  Full code   Continuous     05/27/15 1226    Advance Directive Documentation        Most Recent Value   Type of Advance Directive  Healthcare Power of Attorney, Living will   Pre-existing out of facility DNR order (yellow form or pink MOST form)     "MOST" Form in Place?       Family Communication: Family at bedside Disposition Plan: Back to [facility] once stable.  Consultants:  Critical care specialist  Procedures:  Bronchoscopy  ET tube placement x2  Antibiotics:  Unasyn   Time spent: 20 minutes   Alford Highland  [facility]

## 2015-06-02 NOTE — Progress Notes (Signed)
Patient extubated and is on 2 liter nasal cannula. O 2sat is 96%

## 2015-06-02 NOTE — Clinical Social Work Note (Signed)
Clinical Social Worker is continuing to follow for discharge planning needs. Pt will likely return to [facility], if bed is available at discharge. CSW confirmed with facility. CSW will continue to follow.   Dede QuerySarah Leita Lindbloom, MSW, LCSW Clinical Social Worker [phone]

## 2015-06-03 ENCOUNTER — Inpatient Hospital Stay: Payer: Medicare Other

## 2015-06-03 DIAGNOSIS — F3160 Bipolar disorder, current episode mixed, unspecified: Secondary | ICD-10-CM

## 2015-06-03 LAB — BASIC METABOLIC PANEL
ANION GAP: 5 (ref 5–15)
BUN: 23 mg/dL — ABNORMAL HIGH (ref 6–20)
CO2: 26 mmol/L (ref 22–32)
Calcium: 8.3 mg/dL — ABNORMAL LOW (ref 8.9–10.3)
Chloride: 110 mmol/L (ref 101–111)
Creatinine, Ser: 0.52 mg/dL (ref 0.44–1.00)
Glucose, Bld: 108 mg/dL — ABNORMAL HIGH (ref 65–99)
POTASSIUM: 4.1 mmol/L (ref 3.5–5.1)
SODIUM: 141 mmol/L (ref 135–145)

## 2015-06-03 LAB — GLUCOSE, CAPILLARY
GLUCOSE-CAPILLARY: 105 mg/dL — AB (ref 65–99)
GLUCOSE-CAPILLARY: 118 mg/dL — AB (ref 65–99)
GLUCOSE-CAPILLARY: 84 mg/dL (ref 65–99)
Glucose-Capillary: 100 mg/dL — ABNORMAL HIGH (ref 65–99)
Glucose-Capillary: 111 mg/dL — ABNORMAL HIGH (ref 65–99)
Glucose-Capillary: 106 mg/dL — ABNORMAL HIGH (ref 65–99)

## 2015-06-03 LAB — CBC
HEMATOCRIT: 32.9 % — AB (ref 35.0–47.0)
HEMOGLOBIN: 10.7 g/dL — AB (ref 12.0–16.0)
MCH: 29.4 pg (ref 26.0–34.0)
MCHC: 32.4 g/dL (ref 32.0–36.0)
MCV: 90.7 fL (ref 80.0–100.0)
Platelets: 256 10*3/uL (ref 150–440)
RBC: 3.63 MIL/uL — AB (ref 3.80–5.20)
RDW: 14.8 % — ABNORMAL HIGH (ref 11.5–14.5)
WBC: 9.4 10*3/uL (ref 3.6–11.0)

## 2015-06-03 LAB — MAGNESIUM: Magnesium: 1.9 mg/dL (ref 1.7–2.4)

## 2015-06-03 MED ORDER — ESCITALOPRAM OXALATE 10 MG PO TABS
10.0000 mg | ORAL_TABLET | Freq: Every day | ORAL | Status: DC
  Administered 2015-06-03 – 2015-06-05 (×3): 10 mg
  Filled 2015-06-03 (×3): qty 1

## 2015-06-03 MED ORDER — JEVITY 1.5 CAL/FIBER PO LIQD
1000.0000 mL | ORAL | Status: DC
  Administered 2015-06-03: 1000 mL

## 2015-06-03 MED ORDER — HEPARIN SODIUM (PORCINE) 5000 UNIT/ML IJ SOLN
5000.0000 [IU] | Freq: Three times a day (TID) | INTRAMUSCULAR | Status: DC
  Administered 2015-06-03 – 2015-06-05 (×7): 5000 [IU] via SUBCUTANEOUS
  Filled 2015-06-03 (×7): qty 1

## 2015-06-03 NOTE — Progress Notes (Signed)
[facility] Critical Care Medicine Progess Note    ASSESSMENT/PLAN   66F NH resident X several years after R hemispheric CVA with prior aspiration PNAs admitted via ED with acute respiratory distress and hypoxemia. Intubated 11/23 for  FOB which revealed aspirated food. Extubated 11/29  PULMONARY A: Acute respiratory failure; extubated yesterday, doing well on nasal cannula. H/O recurrent aspirations R mainstem bronchus lesion - aspirated foreign body/material P:   Continue antibiotics for aspiration coverage, course will end on December 2. Prednisone  taper. Okay to transfer to the general medical floor.    CARDIOVASCULAR A:  Bradycardia, asymptomatic-improved P:  Monitor    RENAL A:    GASTROINTESTINAL A:  Swallow evaluation, barium swallow showed dysphagia with microaspiration. P:  Honey thickened liquids only, the patient will likely require a PEG tube.  HEMATOLOGIC A:  Asymmetric LE edema P:  DVT px: Enoxaparin Monitor CBC intermittently Transfuse per usual ICU guidelines   INFECTIOUS A:  Aspiration PNA P:  Monitor temp, WBC count Micro and abx as above  Unasyn 05/29/15>> Sputum 05/30/15; pending.  Bld Cx, 05/27/15; negative.  Urine Cx; 05/27/15; negative.    CXR 05/31/15; small pleural effusion, left; bilat. Infiltrates.   ENDOCRINE A:  Severe hyperglycemia without prior dx of DM, blood glucose now better controlled with light scale insulin. -Patient is on steroids.  P:  SSI ordered, to be continued.  NEUROLOGIC A:  Prior CVA Chronic debilitation Chronic depression P:  RASS goal: -1, 0 PAD protocol Avoid over sedation  PSYCH Hx of Bipolar - resides at [facility], may need to address psych/sedative meds after extubation, given high aspiration risk.   ---------------------------------------   ----------------------------------------   Name: [name] Stuart MRN: [id number] DOB: [birth year]    ADMISSION DATE:  05/27/2015    CHIEF COMPLAINT: Dyspnea   SUBJECTIVE/ review of systems:   Patient has no new complaints today.  She denies chest pain, orthopnea, PND, remainder of the review systems were reviewed with this patient and was found to be negative.   VITAL SIGNS: Temp:  [97 F (36.1 C)-98.4 F (36.9 C)] 98.1 F (36.7 C) (11/30 0800) Pulse Rate:  [59-78] 75 (11/30 0800) Resp:  [13-27] 15 (11/30 0800) BP: (103-140)/(48-82) 136/63 mmHg (11/30 0800) SpO2:  [92 %-97 %] 94 % (11/30 0800) Weight:  [88.1 kg (194 lb 3.6 oz)] 88.1 kg (194 lb 3.6 oz) (11/30 0600) HEMODYNAMICS:   VENTILATOR SETTINGS:   INTAKE / OUTPUT:  Intake/Output Summary (Last 24 hours) at 06/03/15 1317 Last data filed at 06/03/15 1156  Gross per 24 hour  Intake   2330 ml  Output   3551 ml  Net  -1221 ml    PHYSICAL EXAMINATION: Physical Examination:   VS: BP 136/63 mmHg  Pulse 75  Temp(Src) 98.1 F (36.7 C) (Other (Comment))  Resp 15  Ht  (1.676 m)  Wt 88.1 kg (194 lb 3.6 oz)  BMI 31.36 kg/m2  SpO2 94%  General Appearance: No distress  Neuro:without focal findings, mental status normal. HEENT: PERRLA, EOM intact. Pulmonary: normal breath sounds   CardiovascularNormal S1,S2.  No m/r/g.   Abdomen: Benign, Soft, non-tender. Renal:  No costovertebral tenderness  GU:  Not performed at this time. Endocrine: No evident thyromegaly. Skin:   warm, no rashes, no ecchymosis  Extremities: normal, no cyanosis, clubbing.   LABS:   LABORATORY PANEL:   CBC  Recent Labs Lab 06/03/15 0452  WBC 9.4  HGB 10.7*  HCT 32.9*  PLT 256  Chemistries   Recent Labs Lab 05/30/15 1151  06/03/15 0452  NA 138  < > 141  K 4.2  < > 4.1  CL 104  < > 110  CO2 24  < > 26  GLUCOSE 105*  < > 108*  BUN 11  < > 23*  CREATININE 0.54  < > 0.52  CALCIUM 8.5*  < > 8.3*  MG 2.2  --  1.9  AST 13*  --   --   ALT 8*  --   --   ALKPHOS 120  --    --   BILITOT 0.6  --   --   < > = values in this interval not displayed.   Recent Labs Lab 06/02/15 1656 06/02/15 1941 06/03/15 0022 06/03/15 0418 06/03/15 0753 06/03/15 1136  GLUCAP 142* 128* 105* 118* 100* 84    Recent Labs Lab 05/31/15 1300 06/02/15 0525 06/02/15 1110  PHART 7.48* 7.45 7.51*  PCO2ART 38 40 31*  PO2ART 68* 66* 75*    Recent Labs Lab 05/28/15 0458 05/30/15 1151  AST 13* 13*  ALT 8* 8*  ALKPHOS 113 120  BILITOT 0.6 0.6  ALBUMIN 2.7* 2.8*    Cardiac Enzymes No results for input(s): TROPONINI in the last 168 hours.  RADIOLOGY:  Dg Chest 1 View  06/03/2015  CLINICAL DATA:  80 year old female with shortness of Breath, Healthcare associated pneumonia. Initial encounter. EXAM: CHEST 1 VIEW COMPARISON:  06/02/2015 and earlier. FINDINGS: Portable AP semi upright view at 0553 hours. Endotracheal tube no longer identified. Enteric tube courses to the abdomen, side hole at the level of the gastric fundus. Stable cardiac size and mediastinal contours. Veiling opacity at the lung bases greater on the left. Continued obscuration of the left hemidiaphragm and dense retrocardiac opacity. No pneumothorax or overt edema. No areas of worsening ventilation. IMPRESSION: 1. Extubated.  Enteric tube remains in place. 2. Stable lung volumes and ventilation with left greater than right pleural effusions and left lower lobe collapse/consolidation. Electronically Signed   By: Odessa FlemingH  Hall M.D.   On: 06/03/2015 07:23   Dg Chest 1 View  06/02/2015  CLINICAL DATA:  Dyspnea. EXAM: CHEST 1 VIEW COMPARISON:  May 31, 2015. FINDINGS: Endotracheal and nasogastric tubes are unchanged in position. No pneumothorax is noted. Stable cardiomegaly and central pulmonary vascular congestion. Left lung opacity is significantly improved compared to prior exam, with mild residual atelectasis or edema seen in mild residual left pleural effusion. Bony thorax is unremarkable. Improved right basilar  opacity is also noted most consistent with improved edema or atelectasis. IMPRESSION: Improved bilateral lung opacities are noted consistent with improving atelectasis or edema, with mild residual left pleural effusion. Stable support apparatus. Electronically Signed   By: Lupita RaiderJames  Green Jr, M.D.   On: 06/02/2015 07:46   Dg Abd 1 View  06/02/2015  CLINICAL DATA:  Encounter for nasogastric tube placement. EXAM: ABDOMEN - 1 VIEW COMPARISON:  May 29, 2015 FINDINGS: The bowel gas pattern is normal. Nasogastric tube tip is seen in proximal stomach. IMPRESSION: Nasogastric tube tip seen in proximal stomach. Electronically Signed   By: Lupita RaiderJames  Green Jr, M.D.   On: 06/02/2015 15:16       --Wells Guileseep Cathan Gearin, MD.  [facility]   Santiago Gladavid Kasa, M.D.  Stephanie AcreVishal Mungal, M.D.  Billy Fischeravid Simonds, M.D

## 2015-06-03 NOTE — Progress Notes (Signed)
Nutrition Follow-up    INTERVENTION:   EN: discussed nutritional poc with MD Nicholos Johns, MD wants to continue NG with nutrition and free water at present; post extubation, recommend changing to Vital 1.5 at rate of 45 ml/hr providing 1620 kcals, 69 g of protein, 821 mL of free water. Current free water flushes providing additional 600 mL of free water; continue to assess. If tolerating po intake, ultimately recommend removal of NG and discontinuation of TF with assessment of nutritional intake via po.  Medical Food Supplement Therapy: add Honey Thick Mighty Shakes, Magic Cup to meal trays  NUTRITION DIAGNOSIS:   Inadequate oral intake related to acute illness as evidenced by NPO status. Being addressed via TF and diet advancement   GOAL:   Provide needs based on ASPEN/SCCM guidelines  MONITOR:    (Energy intake, Pulmonary profile, Electrolyte and renal profile)  REASON FOR ASSESSMENT:   Consult Enteral/tube feeding initiation and management  ASSESSMENT:    Pt s/p extubation yesterday, NG inserted post extubation with continuation of TF, MBSS today by SLP; pt anxious, some confusion  Past Medical History  Diagnosis Date  . Other specified rehabilitation procedure(V57.89)   . Unspecified constipation   . Esophageal reflux   . Chronic airway obstruction, not elsewhere classified   . Other specified disorder of skin   . Other symptoms involving skin and integumentary tissues   . Difficulty in walking(719.7)   . Muscle weakness (generalized)   . Dysphagia, oropharyngeal phase   . Cognitive communication deficit   . Anxiety state, unspecified   . Unspecified essential hypertension   . Unspecified late effects of cerebrovascular disease   . Depressive disorder, not elsewhere classified   . Other and unspecified hyperlipidemia   . Restless legs syndrome (RLS)   . Other chronic pain   . Insomnia, unspecified   . Pressure ulcer, buttock(707.05)   . Unspecified episodic  mood disorder   . Stroke [facility])     Diet Order:  Diet full liquid Room service appropriate?: Yes with Assist; Fluid consistency:: Honey Thick  EN: Vital High Protein at rate of 60 ml/hr, 200 mL q 8 hour free water  Digestive System: no signs of TF intolerance  Last BM:  11/30 large loose   Electrolyte and Renal Profile:  Recent Labs Lab 05/28/15 0458  05/30/15 1151  06/01/15 1525 06/02/15 0439 06/03/15 0452  BUN 11  < > 11  < > 23* 25* 23*  CREATININE 0.66  < > 0.54  < > 0.55 0.57 0.52  NA 133*  < > 138  < > 138 142 141  K 5.0  < > 4.2  < > 3.6 3.0* 4.1  MG 2.0  --  2.2  --   --   --  1.9  < > = values in this interval not displayed. Glucose Profile:   Recent Labs  06/03/15 0418 06/03/15 0753 06/03/15 1136  GLUCAP 118* 100* 84   Meds: ss novolog, IVF discontinued  Height:   Ht Readings from Last 1 Encounters:  05/27/15  (1.676 m)    Weight:   Wt Readings from Last 1 Encounters:  06/03/15 194 lb 3.6 oz (88.1 kg)    BMI:  Body mass index is 31.36 kg/(m^2).  Estimated Nutritional Needs:   Kcal:  6295-2841 kcals (BEE 1087, 1.3 AF, 1.1-1.3 IF) using IBW 59 kg  Protein:  65-83 g (1.1-1.4 g/kg)   Fluid:  1475-1770 mL (25-30 ml/kg)   EDUCATION NEEDS:   No education  needs identified at this time  HIGH Care Level  Romelle Starcherate Rayon Mcchristian MS, RD, LDN [phone] Pager

## 2015-06-03 NOTE — Progress Notes (Signed)
   06/03/15 1100  Clinical Encounter Type  Visited With Family  Visit Type Follow-up  Consult/Referral To Chaplain  Spiritual Encounters  Spiritual Needs Emotional  Stress Factors  Family Stress Factors Health changes  Chaplain checked in with spouse as he was walking through the unit. Gave me a patient update and was appreciative our our time together. Chaplain Duaa Stelzner A. Bennie Scaff Ext. [phone]

## 2015-06-03 NOTE — Evaluation (Signed)
Objective Swallowing Evaluation: MBS-Modified Barium Swallow Study  Patient Details  Name: [name] Stuart MRN: [id number] Date of Birth: 01-Jan-1935  Today's Date: 06/03/2015 Time: SLP Start Time (ACUTE ONLY): 1005-SLP Stop Time (ACUTE ONLY): 1105 SLP Time Calculation (min) (ACUTE ONLY): 60 min  Past Medical History:  Past Medical History  Diagnosis Date  . Other specified rehabilitation procedure(V57.89)   . Unspecified constipation   . Esophageal reflux   . Chronic airway obstruction, not elsewhere classified   . Other specified disorder of skin   . Other symptoms involving skin and integumentary tissues   . Difficulty in walking(719.7)   . Muscle weakness (generalized)   . Dysphagia, oropharyngeal phase   . Cognitive communication deficit   . Anxiety state, unspecified   . Unspecified essential hypertension   . Unspecified late effects of cerebrovascular disease   . Depressive disorder, not elsewhere classified   . Other and unspecified hyperlipidemia   . Restless legs syndrome (RLS)   . Other chronic pain   . Insomnia, unspecified   . Pressure ulcer, buttock(707.05)   . Unspecified episodic mood disorder   . Stroke [facility])    Past Surgical History:  Past Surgical History  Procedure Laterality Date  . Cholecystectomy    . Bony pelvis surgery     HPI: [name] Stuart is a 80 y.o. female with a known history of cerebrovascular accident, with paralysis living in nursing home for last 2 years, using oxygen as needed basis and not able to walk but still able to feed herself with her arms, also history of the hypercholesterolemia, anxiety and bipolar disorder, esophageal reflux-was taken by husband yesterday to home for the holidays when she was at her baseline condition. She also uses albuterol nebulizers so husband gave her nebulizer treatment 2-3 times overnight but around 2 or 3 in the nighttime he noted that the patient is getting more and more short of breath in spite of  using albuterol treatment and she felt very warm so he brought her to emergency room in the morning., In ER patient is noted to have right upper lobe pneumonia with questionable nodularity, hypoxic required BiPAP , high lactic acid, low calcium level, slight hypotension and tachycardia - and as per husband she is also more confused than her baseline - so she is given for admission for sepsis and pneumonia. Pt was admitted on admission and extubated briefly for a day b/f being emergently reintubated d/t decline in respiratory status. Pt was then extubated again on 11.29.16 and has been tolerating extubation this time per NSG. CT chest this admission revealed patchy infiltrates and "debris" in R main bronchus w/ report that pt aspirated "peas" per MD. Pt does have baseline oropharyngeal phase Dysphagia per chart. NG in place at this time.  Subjective: pt was awake, somewhat effortful breathing post exertion of transfer to swallow chair. Decreased volume of speech; appeared weak.  Chief complaint: dysphagia. Pt was assessed w/ NG in place during the study.   Objective:  Radiological Procedure: A videoflouroscopic evaluation of oral-preparatory, reflex initiation, and pharyngeal phases of the swallow was performed; as well as a screening of the upper esophageal phase.  I. POSTURE: upright II. VIEW: lateral III. COMPENSATORY STRATEGIES: TSP(only) presentation w/ liquids; lingual sweeping w/ f/u, dry swallow to aid clearing; alternate food/liquid boluses; time b/t trials to rest and avoid SOB/exertion; slight Chin Tuck position when swallowing.  IV. BOLUSES ADMINISTERED:  Thin Liquid: NT  Nectar-thick Liquid: NT  Honey-thick Liquid: 8 TSP trials  Puree: 4 trials(3/4 amounts)  Mechanical Soft: NT V. RESULTS OF EVALUATION: A. ORAL PREPARATORY PHASE: (The lips, tongue, and velum are observed for strength and coordination)       **Overall Severity Rating: MODERATE. Decreased bolus control w/ premature  spillage into the pharynx w/ all consistencies.   B. SWALLOW INITIATION/REFLEX: (The reflex is normal if "triggered" by the time the bolus reached the base of the tongue)  **Overall Severity Rating: SEVERE. Tsp trials of Honey consistency liquids fully filled pyriform sinuses prior to the initiation of the swallow reflex; puree consistency trials spilled to the pyriform sinuses and moderately filled them prior to the initiation of the swallow reflex.  C. PHARYNGEAL PHASE: (Pharyngeal function is normal if the bolus shows rapid, smooth, and continuous transit through the pharynx and there is no pharyngeal residue after the swallow)  **Overall Severity Rating: MODERATE. Noted reduced epiglottic movement w/ residue from bolus trials coating the underneath side especially; suspect apparent edema in the area of the epiglottis/aretynoids and laryngeal vestibule entrance could be impacting airway closure. Intermittent pharyngeal residue noted indicating decreased pharyngeal peristalsis, BOT weakness. Using a slight Chin Tuck(the most pt could manage) and a f/u swallow aided reduction in the pharyngeal residue; alternating food/liquid trials aided as well.  D. LARYNGEAL PENETRATION: (Material entering into the laryngeal inlet/vestibule but not aspirated): trace-min. amount of laryngeal penetration noted during, and post, swallowing. Decreased, tight airway closure suspected. Noticeable laryngeal penetration x2; none noted w/ pt used a slight chin tuck when swallowing w/ the TSP presentation of honey consistency liquids.  E. ASPIRATION: min. amount suspected x1 but difficult to see clearly sec. to the lateral view obscured by shadow of the shoulders.  F. ESOPHAGEAL PHASE: (Screening of the upper esophagus): limited view d/t positioning  ASSESSMENT: Pt presented w/ severe Pharyngeal phase dysphagia c/b a severe delay in pharyngeal swallow initiation which greatly increases risk for aspiration to occur w/ po  intake; suspect some of this presentation is directly related to her baseline Dysphagia and now currently declined pulmonary and medical status'. Noted the tsp trials of Honey consistency liquids fully filled the pyriform sinuses prior to the initiation of the swallow reflex; puree consistency trials spilled to the pyriform sinuses and intermittently filled them prior to the initiation of the swallow reflex. Laryngeal penetration w/ suspected min. Aspiration x2 occurred during monitored, TSP trials of Honey consistency liquids - this was SILENT in nature and pt was cued to throat clear/cough to attempt to clear the material. Pharyngeal residue noted on underneath side of epiglottis and BOT/valleculae indicating decreased pharyngeal pressure and BOT strength. During the Oral phase, noted decreased bolus control w/ premature spillage as well as quick swallowing - this decreased oral control w/ loss of bolus can increase risk for aspiration to occur w/ po's of all textures. Due to this severity of the swallow delay and declined pulmonary and medical status' (weakness), as well as the SILENT nature of the laryngeal Penetration and Aspiration when it occured, pt presents w/ increased risk for aspiration w/ questionable ability to effectively protect, and clear, her airway form aspirated material thus increasing risk for further decline in pulmonary status. MD consulted and agreed w/ trial of oral diet. Pt would benefit from a dysphagia diet at this time w/ strict aspiration precautions and assistance feeding at meals to ensure f/u w/ strategies and precautions.   PLAN/RECOMMENDATIONS:  A. Diet: Full Liquid diet w/ Honey consistency liquids (d/t the presence of NG at this time)  B. Swallowing  Precautions: chin tuck as able; oral/lingual sweeping w/ f/u, dry swallow to clear any oropharyngeal residue remaining; alternate food/liquids; time b/t trials to allow for f/u swallows and for rest breaks to avoid SOB from  exertion/fatigue.  C. Recommended consultation to: Dietician for nutritional support; Palliative Care consult  D. Therapy recommendations: skilled ST dysphagia tx at discharge  E. Results and recommendations were discussed w/ MD/NSG/Dietician  CHL IP CLINICAL IMPRESSIONS 06/03/2015  Therapy Diagnosis Moderate oral phase dysphagia;Severe pharyngeal phase dysphagia  Clinical Impression --  Impact on safety and function Severe aspiration risk      CHL IP TREATMENT RECOMMENDATION 06/03/2015  Treatment Recommendations Therapy as outlined in treatment plan below     Prognosis 06/03/2015  Prognosis for Safe Diet Advancement Guarded  Barriers to Reach Goals Severity of deficits  Barriers/Prognosis Comment --    CHL IP DIET RECOMMENDATION 06/03/2015  SLP Diet Recommendations Dysphagia 1 (Puree) solids;Honey thick liquids  Liquid Administration via Spoon  Medication Administration Crushed with puree  Compensations Minimize environmental distractions;Slow rate;Small sips/bites;Multiple dry swallows after each bite/sip;Lingual sweep for clearance of pocketing;Follow solids with liquid  Postural Changes Seated upright at 90 degrees      CHL IP OTHER RECOMMENDATIONS 06/03/2015  Recommended Consults (No Data)  Oral Care Recommendations Oral care BID;Staff/trained caregiver to provide oral care  Other Recommendations Order thickener from pharmacy;Prohibited food (jello, ice cream, thin soups);Remove water pitcher      CHL IP FOLLOW UP RECOMMENDATIONS 06/03/2015  Follow up Recommendations Skilled Nursing facility      [facility] IP FREQUENCY AND DURATION 06/03/2015  Speech Therapy Frequency (ACUTE ONLY) min 3x week  Treatment Duration 1 week               [name], MS, CCC-SLP  [name] Stuart 06/03/2015, 2:52 PM

## 2015-06-03 NOTE — Progress Notes (Signed)
Physical Therapy Treatment/Re-evaluation Patient Details Name: [name] Stuart MRN: [id number] DOB: May 06, 1935 Today's Date: 06/03/2015    History of Present Illness Pt is a 80 y/o female that has been a SNF resident ~ 2 years now, she went home for the holidays with family and became short of breath. She was intubated on this admission, and is now extubated. At baseline she has been transferring via an overhead lift to a wheelchair and is able to feed herself.  Patient intubated again on 11/25 and was extubated on 11/29.     PT Comments    Patient recently seen by this author, however she was re-intubated since that evaluation session. Patient at baseline requires a lift to transfer from bed to wheelchair and has not attempted unsupported sitting in almost 2-3 years. Patient demonstrates 3+/5 in available range for elbow flexion/extension and shoulder flexion on RUE. She is able to initiate hip flexion via heel slides correlating to 2+/5 (R provides increased assistance than L). She is able to bring her R UE to her mouth to feed herself, R grip slightly stronger than L grip. Noted L elbow contracture at roughly 90 degrees of elbow flexion, as well as weeping on LUE. At this point patient appears at her baseline in terms of mobility, for her acute stay would recommend attempting lift transfer when she is more medically stable. She has been working with PT on increased mobility while at [facility], and she appears to be progressing with this.   Follow Up Recommendations  Home health PT     Equipment Recommendations       Recommendations for Other Services OT consult     Precautions / Restrictions Precautions Precautions: None Restrictions Weight Bearing Restrictions: No    Mobility  Bed Mobility               General bed mobility comments: Patient appears able to sit more centered in bed, deferred mobility as per husband unsupported sitting has not attempted this in over 2 years.    Transfers                    Ambulation/Gait                 Stairs            Wheelchair Mobility    Modified Rankin (Stroke Patients Only)       Balance                                    Cognition Arousal/Alertness: Awake/alert;Lethargic Behavior During Therapy: WFL for tasks assessed/performed Overall Cognitive Status:  (Pt is able to recall working with PT several days ago, she provides limited responses but all are appropriate.)                      Exercises      General Comments General comments (skin integrity, edema, etc.): Weeping noted around L elbow, notified RN.       Pertinent Vitals/Pain Pain Assessment:  (No complaints of pain during this session)    Home Living                      Prior Function            PT Goals (current goals can now be found in the care plan section) Acute Rehab PT Goals Patient  Stated Goal: To return to [facility]  PT Goal Formulation: With patient/family Time For Goal Achievement: 06/12/15 Potential to Achieve Goals: Good Progress towards PT goals: Progressing toward goals    Frequency  Min 2X/week    PT Plan Current plan remains appropriate    Co-evaluation             End of Session Equipment Utilized During Treatment: Oxygen Activity Tolerance: Patient tolerated treatment well Patient left: in bed;with call bell/phone within reach;with family/visitor present     Time: [id number] PT Time Calculation (min) (ACUTE ONLY): 10 min  Charges:     Re-evaluation                   G Codes:      ,Kerin RansomPatrick A Mardie Kellen, PT, DPT    06/03/2015, 2:04 PM

## 2015-06-03 NOTE — Progress Notes (Signed)
Patient ID: [name] Stuart, female   DOB: 02/08/1935, 80 y.o.   MRN: [id number] [facility] PROGRESS NOTE  PCP: [facility] [place]  HPI/Subjective: Patient eating thicken liquids when I saw her. Feeling a lot better since being on the ventilator. Able to swallow her saliva. She has an NG tube with feeding at this point.  Objective: Filed Vitals:   06/03/15 1300 06/03/15 1400  BP: 140/69 137/113  Pulse: 85 82  Temp: 98.1 F (36.7 C) 98.2 F (36.8 C)  Resp: 18 18    Filed Weights   06/01/15 0435 06/02/15 0416 06/03/15 0600  Weight: 87.9 kg (193 lb 12.6 oz) 89.2 kg (196 lb 10.4 oz) 88.1 kg (194 lb 3.6 oz)    ROS: Review of Systems  Constitutional: Negative for fever and chills.  Eyes: Negative for blurred vision.  Respiratory: Positive for cough. Negative for shortness of breath.   Cardiovascular: Negative for chest pain.  Gastrointestinal: Negative for nausea, vomiting, abdominal pain, diarrhea and constipation.  Genitourinary: Negative for dysuria.  Musculoskeletal: Negative for joint pain.  Neurological: Negative for dizziness and headaches.  Psychiatric/Behavioral: The patient is not nervous/anxious.     Exam: Physical Exam  HENT:  Nose: No mucosal edema.  Mouth/Throat: No oropharyngeal exudate or posterior oropharyngeal edema.  Eyes: Conjunctivae and lids are normal. Pupils are equal, round, and reactive to light.  Neck: No JVD present. Carotid bruit is not present. No edema present. No thyroid mass and no thyromegaly present.  Cardiovascular: S1 normal and S2 normal.  Exam reveals no gallop.   No murmur heard. Pulses:      Dorsalis pedis pulses are 2+ on the right side, and 2+ on the left side.  Respiratory: No accessory muscle usage. No respiratory distress. She has no decreased breath sounds. She has no wheezes. She has rhonchi in the right lower field. She has no rales.  GI: Soft. Bowel sounds are normal. There is no tenderness.   Musculoskeletal:       Right ankle: She exhibits swelling.       Left ankle: She exhibits swelling.  Lymphadenopathy:    She has no cervical adenopathy.  Neurological: She is alert.  Skin: Skin is warm. Nails show no clubbing.  Chronic lower extremity discoloration  Psychiatric: She has a normal mood and affect.    Data Reviewed: Basic Metabolic Panel:  Recent Labs Lab 05/28/15 0458  05/30/15 1151 05/31/15 0934 06/01/15 1525 06/02/15 0439 06/03/15 0452  NA 133*  < > 138 139 138 142 141  K 5.0  < > 4.2 5.0 3.6 3.0* 4.1  CL 104  < > 104 107 106 109 110  CO2 26  < > GLUCOSE 136*  < > 105* 108* 179* 115* 108*  BUN 11  < > 11 16 23* 25* 23*  CREATININE 0.66  < > 0.54 0.54 0.55 0.57 0.52  CALCIUM 8.3*  < > 8.5* 8.0* 8.0* 8.1* 8.3*  MG 2.0  --  2.2  --   --   --  1.9  < > = values in this interval not displayed. Liver Function Tests:  Recent Labs Lab 05/28/15 0458 05/30/15 1151  AST 13* 13*  ALT 8* 8*  ALKPHOS 113 120  BILITOT 0.6 0.6  PROT 5.7* 5.8*  ALBUMIN 2.7* 2.8*   CBC:  Recent Labs Lab 05/28/15 0458 05/29/15 0625 05/31/15 0934 06/02/15 0439 06/03/15 0452  WBC 14.6* 9.7 8.9 9.0 9.4  HGB  11.6* 10.8* 12.0 11.3* 10.7*  HCT 35.0 33.5* 36.8 34.0* 32.9*  MCV 90.5 90.8 89.6 90.7 90.7  PLT 256 266 293 244 256    CBG:  Recent Labs Lab 06/02/15 1941 06/03/15 0022 06/03/15 0418 06/03/15 0753 06/03/15 1136  GLUCAP 128* 105* 118* 100* 84    Recent Results (from the past 240 hour(s))  Blood culture (routine x 2)     Status: None   Collection Time: 05/27/15  6:51 AM  Result Value Ref Range Status   Specimen Description BLOOD  Final   Special Requests NONE  Final   Culture NO GROWTH 5 DAYS  Final   Report Status 06/01/2015 FINAL  Final  Urine culture     Status: None   Collection Time: 05/27/15  6:51 AM  Result Value Ref Range Status   Specimen Description URINE, CLEAN CATCH  Final   Special Requests NONE  Final   Culture NO  GROWTH 2 DAYS  Final   Report Status 05/30/2015 FINAL  Final  Blood culture (routine x 2)     Status: None   Collection Time: 05/27/15  6:52 AM  Result Value Ref Range Status   Specimen Description BLOOD RIGHT HAND  Final   Special Requests BOTTLES DRAWN AEROBIC AND ANAEROBIC  2CC  Final   Culture NO GROWTH 5 DAYS  Final   Report Status 06/01/2015 FINAL  Final  MRSA PCR Screening     Status: None   Collection Time: 05/27/15 12:27 PM  Result Value Ref Range Status   MRSA by PCR NEGATIVE NEGATIVE Final    Comment:        The GeneXpert MRSA Assay (FDA approved for NASAL specimens only), is one component of a comprehensive MRSA colonization surveillance program. It is not intended to diagnose MRSA infection nor to guide or monitor treatment for MRSA infections.   Culture, expectorated sputum-assessment     Status: None   Collection Time: 05/30/15  3:10 PM  Result Value Ref Range Status   Specimen Description EXPECTORATED SPUTUM  Final   Special Requests Normal  Final   Sputum evaluation THIS SPECIMEN IS ACCEPTABLE FOR SPUTUM CULTURE  Final   Report Status 05/30/2015 FINAL  Final  Culture, respiratory (NON-Expectorated)     Status: None   Collection Time: 05/30/15  3:10 PM  Result Value Ref Range Status   Specimen Description EXPECTORATED SPUTUM  Final   Special Requests Normal Reflexed from [id number]  Final   Gram Stain   Final    MODERATE WBC SEEN NO ORGANISMS SEEN GOOD SPECIMEN - 80-90% WBCS    Culture NO GROWTH 2 DAYS  Final   Report Status 06/01/2015 FINAL  Final     Studies: Dg Chest 1 View  06/03/2015  CLINICAL DATA:  [age] year old female with shortness of Breath, Healthcare associated pneumonia. Initial encounter. EXAM: CHEST 1 VIEW COMPARISON:  06/02/2015 and earlier. FINDINGS: Portable AP semi upright view at 0553 hours. Endotracheal tube no longer identified. Enteric tube courses to the abdomen, side hole at the level of the gastric fundus. Stable cardiac size and  mediastinal contours. Veiling opacity at the lung bases greater on the left. Continued obscuration of the left hemidiaphragm and dense retrocardiac opacity. No pneumothorax or overt edema. No areas of worsening ventilation. IMPRESSION: 1. Extubated.  Enteric tube remains in place. 2. Stable lung volumes and ventilation with left greater than right pleural effusions and left lower lobe collapse/consolidation. Electronically Signed   By: Althea GrimmerH  Hall M.D.  On: 06/03/2015 07:23   Dg Chest 1 View  06/02/2015  CLINICAL DATA:  Dyspnea. EXAM: CHEST 1 VIEW COMPARISON:  May 31, 2015. FINDINGS: Endotracheal and nasogastric tubes are unchanged in position. No pneumothorax is noted. Stable cardiomegaly and central pulmonary vascular congestion. Left lung opacity is significantly improved compared to prior exam, with mild residual atelectasis or edema seen in mild residual left pleural effusion. Bony thorax is unremarkable. Improved right basilar opacity is also noted most consistent with improved edema or atelectasis. IMPRESSION: Improved bilateral lung opacities are noted consistent with improving atelectasis or edema, with mild residual left pleural effusion. Stable support apparatus. Electronically Signed   By: Lupita Raider, M.D.   On: 06/02/2015 07:46   Dg Abd 1 View  06/02/2015  CLINICAL DATA:  Encounter for nasogastric tube placement. EXAM: ABDOMEN - 1 VIEW COMPARISON:  May 29, 2015 FINDINGS: The bowel gas pattern is normal. Nasogastric tube tip is seen in proximal stomach. IMPRESSION: Nasogastric tube tip seen in proximal stomach. Electronically Signed   By: Lupita Raider, M.D.   On: 06/02/2015 15:16    Scheduled Meds: . acetylcysteine  2 mL Nebulization Q4H  . amLODipine  5 mg Oral Daily  . budesonide (PULMICORT) nebulizer solution  0.25 mg Nebulization BID  . chlorhexidine gluconate  15 mL Mouth Rinse BID  . clopidogrel  75 mg Per Tube Daily  . famotidine (PEPCID) IV  20 mg Intravenous Q12H   . fluticasone  2 spray Each Nare Daily  . free water  200 mL Per Tube 3 times per day  . heparin subcutaneous  5,000 Units Subcutaneous 3 times per day  . hydrALAZINE  20 mg Intravenous Once  . insulin aspart  0-15 Units Subcutaneous 6 times per day  . ipratropium-albuterol  3 mL Nebulization Q4H  . miconazole  1 application Topical BID  . OLANZapine  2.5 mg Per Tube BID  . pravastatin  10 mg Per Tube QHS  . rOPINIRole  0.75 mg Per Tube QPM  . traZODone  150 mg Per Tube QHS  . valproic acid  250 mg Oral TID  . [START ON 06/25/2015] Vitamin D (Ergocalciferol)  50,000 Units Oral Q30 days   Continuous Infusions: . feeding supplement (JEVITY 1.5 CAL/FIBER)      Assessment/Plan:  1. Clinical sepsis, aspiration pneumonia.- Patient is Unasyn. Patient is status post bronchoscopy removing Peas from right main stem bronchus. All cultures are negative. 2. Acute respiratory failure with hypoxia- extubated for second time 06/02/2015. Breathing comfortably on nasal cannula. 3. Hyperglycemia on presentation- sugars have improved 4. Hypocalcemia on presentation- this is also improved 5. Wheezing in lungs with history of COPD- nebulizer treatments, prednisone stopped by pulmonary 6. History of stroke- on Plavix and pravastatin 7. Dysphasia- speech therapy allow the patient to have thickened liquids at this point. Still high risk for aspiration. We'll see how things go over the next few days. Patient with NG tube feeding for nutrition. 8. Bradycardia on telemetry monitoring -when sleeping is normal. No further workup. Discontinue telemetry   Code Status:     Code Status Orders        Start     Ordered   05/27/15 1226  Full code   Continuous     05/27/15 1226    Advance Directive Documentation        Most Recent Value   Type of Advance Directive  Healthcare Power of Attorney, Living will   Pre-existing out of facility DNR order (yellow  form or pink MOST form)     "MOST" Form in Place?        Family Communication: Family at bedside Disposition Plan: Back to [facility] once stable.  Consultants:  Critical care specialist  Procedures:  Bronchoscopy  ET tube placement x2  Antibiotics:  Unasyn   Time spent: 20 minutes   Alford Highland  [facility]

## 2015-06-03 NOTE — Consult Note (Signed)
[facility] Face-to-Face Psychiatry Consult   Reason for Consult:  Consult for this 80 year old woman with a past history of bipolar disorder. Consult requested assessment with medication management Referring Physician:  [place] Patient Identification: ARIZONA SORN MRN:  [id number] Principal Diagnosis: Mixed bipolar I disorder ([place]) Diagnosis:   Patient Active Problem List   Diagnosis Date Noted  . Mixed bipolar I disorder ([place]) [F31.60] 06/03/2015  . Aspiration pneumonia of right upper lobe ([place]) [J69.0]   . Respiratory failure ([place]) [J96.90]   . HCAP (healthcare-associated pneumonia) [J18.9] 05/27/2015  . Aspiration of food [T17.890A]     Total Time spent with patient: 45 minutes  Subjective:   [name] Stuart is a 80 y.o. female patient admitted with "I'm feeling much better".  HPI:  Information from the patient and the chart and from her husband. 36 year old woman with a history of bipolar disorder and multiple medical problems. Brought into the hospital after developing respiratory failure and pneumonia over the holiday weekend. Currently receiving tube feeds to avoid aspiration. Patient and her husband report that her mood recently has been good. It has been a long time at least months may BE more than a year since she has had any significant mood symptoms or mood lability. She sleeps normally pretty well. Her anxiety has been under good control. Occasionally she will get panic-like symptoms but they are usually easily resolved with a quarter milligram of Xanax. Husband and patient both state the medicine she was taking [facility] was very helpful and seem to help to stabilize her quite well. No complaints of any hallucinations. No complaints of any suicidal ideation or bizarre behavior.  Social history: Patient has good support from her husband. She is currently residing at [facility] although she came home for the holidays.  Medical history: Acute pneumonia with  aspiration.  Substance abuse history: No acute abuse of substances identified  Past Psychiatric History: Patient has a diagnosis of bipolar disorder although as I have said in some of my previous evaluations of her it's not clear to me whether it's really been full-blown bipolar disorder. In any case medications helped with mood instability and behavior problems. She is currently on a combination of Depakote, Lexapro, trazodone, Zyprexa and when necessary Xanax along with her Requip which has been stable and helpful for quite a while. No history of suicide attempts. Distant history of hospitalization.  Risk to Self: Is patient at risk for suicide?: No Risk to Others:   Prior Inpatient Therapy:   Prior Outpatient Therapy:    Past Medical History:  Past Medical History  Diagnosis Date  . Other specified rehabilitation procedure(V57.89)   . Unspecified constipation   . Esophageal reflux   . Chronic airway obstruction, not elsewhere classified   . Other specified disorder of skin   . Other symptoms involving skin and integumentary tissues   . Difficulty in walking(719.7)   . Muscle weakness (generalized)   . Dysphagia, oropharyngeal phase   . Cognitive communication deficit   . Anxiety state, unspecified   . Unspecified essential hypertension   . Unspecified late effects of cerebrovascular disease   . Depressive disorder, not elsewhere classified   . Other and unspecified hyperlipidemia   . Restless legs syndrome (RLS)   . Other chronic pain   . Insomnia, unspecified   . Pressure ulcer, buttock(707.05)   . Unspecified episodic mood disorder   . Stroke [facility])     Past Surgical History  Procedure Laterality Date  . Cholecystectomy    .  Bony pelvis surgery     Family History:  Family History  Problem Relation Age of Onset  . CAD Mother   . Brain cancer Father    Family Psychiatric  History: There is no known family history of mental health problems Social History:  History   Alcohol Use No     History  Drug Use Not on file    Social History   Social History  . Marital Status: Married    Spouse Name: N/A  . Number of Children: N/A  . Years of Education: N/A   Social History Main Topics  . Smoking status: Current Some Day Smoker -- 0.25 packs/day  . Smokeless tobacco: None  . Alcohol Use: No  . Drug Use: None  . Sexual Activity: Not Asked   Other Topics Concern  . None   Social History Narrative   Additional Social History:                          Allergies:   Allergies  Allergen Reactions  . Aspirin   . Codeine Sulfate   . Ibuprofen   . Penicillins     Labs:  Results for orders placed or performed during the hospital encounter of 05/27/15 (from the past 48 hour(s))  Glucose, capillary     Status: Abnormal   Collection Time: 06/01/15  8:01 PM  Result Value Ref Range   Glucose-Capillary 130 (H) 65 - 99 mg/dL  Glucose, capillary     Status: None   Collection Time: 06/01/15 11:32 PM  Result Value Ref Range   Glucose-Capillary 91 65 - 99 mg/dL  Glucose, capillary     Status: Abnormal   Collection Time: 06/02/15  4:11 AM  Result Value Ref Range   Glucose-Capillary 104 (H) 65 - 99 mg/dL  CBC     Status: Abnormal   Collection Time: 06/02/15  4:39 AM  Result Value Ref Range   WBC 9.0 3.6 - 11.0 K/uL   RBC 3.75 (L) 3.80 - 5.20 MIL/uL   Hemoglobin 11.3 (L) 12.0 - 16.0 g/dL   HCT 34.0 (L) 35.0 - 47.0 %   MCV 90.7 80.0 - 100.0 fL   MCH 30.2 26.0 - 34.0 pg   MCHC 33.3 32.0 - 36.0 g/dL   RDW 14.6 (H) 11.5 - 14.5 %   Platelets 244 150 - 440 K/uL  Basic metabolic panel     Status: Abnormal   Collection Time: 06/02/15  4:39 AM  Result Value Ref Range   Sodium 142 135 - 145 mmol/L   Potassium 3.0 (L) 3.5 - 5.1 mmol/L   Chloride 109 101 - 111 mmol/L   CO2 26 22 - 32 mmol/L   Glucose, Bld 115 (H) 65 - 99 mg/dL   BUN 25 (H) 6 - 20 mg/dL   Creatinine, Ser 0.57 0.44 - 1.00 mg/dL   Calcium 8.1 (L) 8.9 - 10.3 mg/dL   GFR calc  non Af Amer >60 >60 mL/min   GFR calc Af Amer >60 >60 mL/min    Comment: (NOTE) The eGFR has been calculated using the CKD EPI equation. This calculation has not been validated in all clinical situations. eGFR's persistently <60 mL/min signify possible Chronic Kidney Disease.    Anion gap 7 5 - 15  Blood gas, arterial     Status: Abnormal   Collection Time: 06/02/15  5:25 AM  Result Value Ref Range   FIO2 0.28    Delivery systems  VENTILATOR    Mode PRESSURE REGULATED VOLUME CONTROL    VT 480 mL   Peep/cpap 5.0 cm H20   pH, Arterial 7.45 7.350 - 7.450   pCO2 arterial 40 32.0 - 48.0 mmHg   pO2, Arterial 66 (L) 83.0 - 108.0 mmHg   Bicarbonate 27.8 21.0 - 28.0 mEq/L   Acid-Base Excess 3.5 (H) 0.0 - 3.0 mmol/L   O2 Saturation 93.6 %   Patient temperature 37.0    Collection site RIGHT RADIAL    Sample type ARTERIAL DRAW    Allens test (pass/fail) POSITIVE (A) PASS   Mechanical Rate 12   Glucose, capillary     Status: None   Collection Time: 06/02/15  7:11 AM  Result Value Ref Range   Glucose-Capillary 98 65 - 99 mg/dL  Blood gas, arterial     Status: Abnormal   Collection Time: 06/02/15 11:10 AM  Result Value Ref Range   FIO2 0.28    Delivery systems VENTILATOR    Mode SPONTANEOUS BREATHING TRIAL    Peep/cpap 5.0 cm H20   Pressure support 5 cm H20   pH, Arterial 7.51 (H) 7.350 - 7.450   pCO2 arterial 31 (L) 32.0 - 48.0 mmHg   pO2, Arterial 75 (L) 83.0 - 108.0 mmHg   Bicarbonate 24.7 21.0 - 28.0 mEq/L   Acid-Base Excess 2.1 0.0 - 3.0 mmol/L   O2 Saturation 96.2 %   Patient temperature 37.0    Collection site RIGHT RADIAL    Sample type ARTERIAL DRAW    Allens test (pass/fail) POSITIVE (A) PASS  Glucose, capillary     Status: Abnormal   Collection Time: 06/02/15 11:14 AM  Result Value Ref Range   Glucose-Capillary 131 (H) 65 - 99 mg/dL  Glucose, capillary     Status: Abnormal   Collection Time: 06/02/15  4:56 PM  Result Value Ref Range   Glucose-Capillary 142 (H) 65 -  99 mg/dL  Glucose, capillary     Status: Abnormal   Collection Time: 06/02/15  7:41 PM  Result Value Ref Range   Glucose-Capillary 128 (H) 65 - 99 mg/dL  Glucose, capillary     Status: Abnormal   Collection Time: 06/03/15 12:22 AM  Result Value Ref Range   Glucose-Capillary 105 (H) 65 - 99 mg/dL  Glucose, capillary     Status: Abnormal   Collection Time: 06/03/15  4:18 AM  Result Value Ref Range   Glucose-Capillary 118 (H) 65 - 99 mg/dL  BMET in AM     Status: Abnormal   Collection Time: 06/03/15  4:52 AM  Result Value Ref Range   Sodium 141 135 - 145 mmol/L   Potassium 4.1 3.5 - 5.1 mmol/L   Chloride 110 101 - 111 mmol/L   CO2 26 22 - 32 mmol/L   Glucose, Bld 108 (H) 65 - 99 mg/dL   BUN 23 (H) 6 - 20 mg/dL   Creatinine, Ser 0.52 0.44 - 1.00 mg/dL   Calcium 8.3 (L) 8.9 - 10.3 mg/dL   GFR calc non Af Amer >60 >60 mL/min   GFR calc Af Amer >60 >60 mL/min    Comment: (NOTE) The eGFR has been calculated using the CKD EPI equation. This calculation has not been validated in all clinical situations. eGFR's persistently <60 mL/min signify possible Chronic Kidney Disease.    Anion gap 5 5 - 15  CBC     Status: Abnormal   Collection Time: 06/03/15  4:52 AM  Result Value Ref Range  WBC 9.4 3.6 - 11.0 K/uL   RBC 3.63 (L) 3.80 - 5.20 MIL/uL   Hemoglobin 10.7 (L) 12.0 - 16.0 g/dL   HCT 32.9 (L) 35.0 - 47.0 %   MCV 90.7 80.0 - 100.0 fL   MCH 29.4 26.0 - 34.0 pg   MCHC 32.4 32.0 - 36.0 g/dL   RDW 14.8 (H) 11.5 - 14.5 %   Platelets 256 150 - 440 K/uL  Magnesium     Status: None   Collection Time: 06/03/15  4:52 AM  Result Value Ref Range   Magnesium 1.9 1.7 - 2.4 mg/dL  Glucose, capillary     Status: Abnormal   Collection Time: 06/03/15  7:53 AM  Result Value Ref Range   Glucose-Capillary 100 (H) 65 - 99 mg/dL  Glucose, capillary     Status: None   Collection Time: 06/03/15 11:36 AM  Result Value Ref Range   Glucose-Capillary 84 65 - 99 mg/dL  Glucose, capillary     Status:  Abnormal   Collection Time: 06/03/15  4:16 PM  Result Value Ref Range   Glucose-Capillary 111 (H) 65 - 99 mg/dL    Current Facility-Administered Medications  Medication Dose Route Frequency Provider Last Rate Last Dose  . acetaminophen (TYLENOL) solution 650 mg  650 mg Per Tube Q4H PRN Wilhelmina Mcardle, MD   650 mg at 05/30/15 2115  . acetylcysteine (MUCOMYST) 20 % nebulizer / oral solution 2 mL  2 mL Nebulization Q4H Laverle Hobby, MD   4 mL at 06/03/15 1132  . albuterol (PROVENTIL) (2.5 MG/3ML) 0.083% nebulizer solution 2.5 mg  2.5 mg Nebulization Q3H PRN Vaughan Basta, MD   2.5 mg at 05/29/15 1417  . ALPRAZolam Duanne Moron) tablet 0.25 mg  0.25 mg Oral Q6H PRN Vaughan Basta, MD      . amLODipine (NORVASC) tablet 5 mg  5 mg Oral Daily Loletha Grayer, MD   5 mg at 06/03/15 0924  . budesonide (PULMICORT) nebulizer solution 0.25 mg  0.25 mg Nebulization BID Loletha Grayer, MD   0.25 mg at 06/03/15 3845  . chlorhexidine gluconate (PERIDEX) 0.12 % solution 15 mL  15 mL Mouth Rinse BID Wilhelmina Mcardle, MD   15 mL at 06/03/15 0748  . clopidogrel (PLAVIX) tablet 75 mg  75 mg Per Tube Daily Wilhelmina Mcardle, MD   75 mg at 06/03/15 3646  . escitalopram (LEXAPRO) tablet 10 mg  10 mg Per Tube Daily Gonzella Lex, MD      . famotidine (PEPCID) IVPB 20 mg premix  20 mg Intravenous Q12H Wilhelmina Mcardle, MD   20 mg at 06/03/15 8032  . feeding supplement (JEVITY 1.5 CAL/FIBER) liquid 1,000 mL  1,000 mL Per Tube Continuous Laverle Hobby, MD 45 mL/hr at 06/03/15 1434 1,000 mL at 06/03/15 1434  . fluticasone (FLONASE) 50 MCG/ACT nasal spray 2 spray  2 spray Each Nare Daily Vaughan Basta, MD   2 spray at 06/03/15 0924  . free water 200 mL  200 mL Per Tube 3 times per day Vishal Mungal, MD   200 mL at 06/03/15 1400  . heparin injection 5,000 Units  5,000 Units Subcutaneous 3 times per day Laverle Hobby, MD   5,000 Units at 06/03/15 1347  . hydrALAZINE (APRESOLINE) injection  20 mg  20 mg Intravenous Once Vilinda Boehringer, MD   Stopped at 05/29/15 1736  . insulin aspart (novoLOG) injection 0-15 Units  0-15 Units Subcutaneous 6 times per day Wilhelmina Mcardle, MD   2  Units at 06/02/15 2010  . ipratropium-albuterol (DUONEB) 0.5-2.5 (3) MG/3ML nebulizer solution 3 mL  3 mL Nebulization Q4H PRN Vishal Mungal, MD      . ipratropium-albuterol (DUONEB) 0.5-2.5 (3) MG/3ML nebulizer solution 3 mL  3 mL Nebulization Q4H Laverle Hobby, MD   3 mL at 06/03/15 1132  . miconazole (MICOTIN) 2 % cream 1 application  1 application Topical BID Vaughan Basta, MD   1 application at 28/36/62 1000  . OLANZapine (ZYPREXA) tablet 2.5 mg  2.5 mg Per Tube BID Wilhelmina Mcardle, MD   2.5 mg at 06/03/15 9476  . polyethylene glycol (MIRALAX / GLYCOLAX) packet 17 g  17 g Per Tube Daily PRN Wilhelmina Mcardle, MD      . pravastatin (PRAVACHOL) tablet 10 mg  10 mg Per Tube QHS Wilhelmina Mcardle, MD   10 mg at 06/02/15 2151  . rOPINIRole (REQUIP) tablet 0.75 mg  0.75 mg Per Tube QPM Wilhelmina Mcardle, MD   0.75 mg at 06/02/15 1713  . traMADol (ULTRAM) tablet 50 mg  50 mg Oral Q6H PRN Vilinda Boehringer, MD   50 mg at 06/03/15 0823  . traZODone (DESYREL) tablet 150 mg  150 mg Per Tube QHS Wilhelmina Mcardle, MD   150 mg at 06/02/15 2151  . valproic acid (DEPAKENE) 250 MG/5ML syrup 250 mg  250 mg Oral TID Vilinda Boehringer, MD   250 mg at 06/03/15 1615  . [START ON 06/25/2015] Vitamin D (Ergocalciferol) (DRISDOL) capsule 50,000 Units  50,000 Units Oral Q30 days Vaughan Basta, MD        Musculoskeletal: Strength & Muscle Tone: decreased Gait & Station: unable to stand Patient leans: N/A  Psychiatric Specialty Exam: Review of Systems  Constitutional: Negative.   HENT: Negative.   Eyes: Negative.   Respiratory: Negative.   Cardiovascular: Negative.   Gastrointestinal: Negative.   Musculoskeletal: Negative.   Skin: Negative.   Neurological: Negative.   Psychiatric/Behavioral: Negative for  depression, suicidal ideas, hallucinations, memory loss and substance abuse. The patient is not nervous/anxious and does not have insomnia.     Blood pressure 160/64, pulse 137, temperature 97.8 F (36.6 C), temperature source Oral, resp. rate 18, height 5' 6"  (1.676 m), weight 88.1 kg (194 lb 3.6 oz), SpO2 96 %.Body mass index is 31.36 kg/(m^2).  General Appearance: Disheveled  Eye Contact::  Good  Speech:  Slow  Volume:  Decreased  Mood:  Euthymic  Affect:  Appropriate  Thought Process:  Goal Directed  Orientation:  Full (Time, Place, and Person)  Thought Content:  Negative  Suicidal Thoughts:  No  Homicidal Thoughts:  No  Memory:  Immediate;   Good Recent;   Fair Remote;   Fair  Judgement:  Fair  Insight:  Fair  Psychomotor Activity:  Normal  Concentration:  Fair  Recall:  [employer] of Knowledge:Fair  Language: Poor  Akathisia:  Negative  Handed:  Right  AIMS (if indicated):     Assets:  Communication Skills Desire for Improvement Housing Resilience Social Support  ADL's:  Intact  Cognition: WNL  Sleep:      Treatment Plan Summary: Medication management and Plan Patient husband and I all agree that her most recent medication management seem to be working well. Everything is in place for her current medicines to be given by the tube except for her Lexapro. I have restarted Lexapro 10 mg a day. Once she can start to take orals again I would switch most of her or  all of her medicines back over to the oral form. The lip would Depakene given 3 times a day as a reasonable substitute for the 750 mg extended release Depakote. Supportive counseling completed. No other change I will follow-up as needed.  Disposition: Patient does not meet criteria for psychiatric inpatient admission. Supportive therapy provided about ongoing stressors.  John Clapacs 06/03/2015 6:00 PM

## 2015-06-04 ENCOUNTER — Inpatient Hospital Stay (HOSPITAL_COMMUNITY)
Admit: 2015-06-04 | Discharge: 2015-06-04 | Disposition: A | Discharge status: Home / Self Care | Payer: Medicare Other | Attending: Cardiovascular Disease | Admitting: Cardiovascular Disease

## 2015-06-04 DIAGNOSIS — F3160 Bipolar disorder, current episode mixed, unspecified: Secondary | ICD-10-CM

## 2015-06-04 DIAGNOSIS — I5031 Acute diastolic (congestive) heart failure: Secondary | ICD-10-CM

## 2015-06-04 LAB — GLUCOSE, CAPILLARY
GLUCOSE-CAPILLARY: 106 mg/dL — AB (ref 65–99)
GLUCOSE-CAPILLARY: 114 mg/dL — AB (ref 65–99)
GLUCOSE-CAPILLARY: 115 mg/dL — AB (ref 65–99)
Glucose-Capillary: 138 mg/dL — ABNORMAL HIGH (ref 65–99)
Glucose-Capillary: 78 mg/dL (ref 65–99)
Glucose-Capillary: 87 mg/dL (ref 65–99)

## 2015-06-04 NOTE — Progress Notes (Signed)
Nutrition Follow-up   INTERVENTION:  Meals and snacks: Cater to pt preferences, monitor diet tolerance Medical Nutrition Supplement Therapy: continues supplements as ordered, magic cup and mightyshakes   NUTRITION DIAGNOSIS:   Inadequate oral intake related to acute illness as evidenced by NPO status, diet being progressed    GOAL:   Patient will meet greater than or equal to 90% of their needs    MONITOR:    (Energy intake, Pulmonary profile, Electrolyte and renal profile)  REASON FOR ASSESSMENT:   Consult Enteral/tube feeding initiation and management  ASSESSMENT:      NG tube removed this pm per RN, Dedra   Current Nutrition: taking bites of full liquids, diet just progressed to solid foods per SLP Tube feeding has stopped and NG removed as diet progressed    Gastrointestinal Profile: Last BM: 12/01   Scheduled Medications:  . acetylcysteine  2 mL Nebulization Q4H  . amLODipine  5 mg Oral Daily  . budesonide (PULMICORT) nebulizer solution  0.25 mg Nebulization BID  . chlorhexidine gluconate  15 mL Mouth Rinse BID  . clopidogrel  75 mg Per Tube Daily  . escitalopram  10 mg Per Tube Daily  . famotidine (PEPCID) IV  20 mg Intravenous Q12H  . fluticasone  2 spray Each Nare Daily  . heparin subcutaneous  5,000 Units Subcutaneous 3 times per day  . hydrALAZINE  20 mg Intravenous Once  . insulin aspart  0-15 Units Subcutaneous 6 times per day  . ipratropium-albuterol  3 mL Nebulization Q4H  . miconazole  1 application Topical BID  . OLANZapine  2.5 mg Per Tube BID  . pravastatin  10 mg Per Tube QHS  . rOPINIRole  0.75 mg Per Tube QPM  . traZODone  150 mg Per Tube QHS  . valproic acid  250 mg Oral TID  . [START ON 06/25/2015] Vitamin D (Ergocalciferol)  50,000 Units Oral Q30 days    Continuous Medications:  . feeding supplement (JEVITY 1.5 CAL/FIBER) 1,000 mL (06/03/15 1434)     Electrolyte/Renal Profile and Glucose Profile:   Recent Labs Lab  05/30/15 1151  06/01/15 1525 06/02/15 0439 06/03/15 0452  NA 138  < > 138 142 141  K 4.2  < > 3.6 3.0* 4.1  CL 104  < > 106 109 110  CO2 24  < > 27 26 26   BUN 11  < > 23* 25* 23*  CREATININE 0.54  < > 0.55 0.57 0.52  CALCIUM 8.5*  < > 8.0* 8.1* 8.3*  MG 2.2  --   --   --  1.9  GLUCOSE 105*  < > 179* 115* 108*  < > = values in this interval not displayed. Protein Profile:  Recent Labs Lab 05/30/15 1151  ALBUMIN 2.8*       Weight Trend since Admission: Filed Weights   06/02/15 0416 06/03/15 0600 06/04/15 0501  Weight: 196 lb 10.4 oz (89.2 kg) 194 lb 3.6 oz (88.1 kg) 194 lb 9.6 oz (88.27 kg)      Diet Order:  DIET - DYS 1 Room service appropriate?: Yes; Fluid consistency:: Honey Thick  Skin:   reviewed   Height:   Ht Readings from Last 1 Encounters:  05/27/15 5\' 6"  (1.676 m)    Weight:   Wt Readings from Last 1 Encounters:  06/04/15 194 lb 9.6 oz (88.27 kg)    Ideal Body Weight:     BMI:  Body mass index is 31.42 kg/(m^2).  Estimated Nutritional Needs:  Kcal:  4098-1191 kcals (BEE 1087, 1.3 AF, 1.1-1.3 IF) using IBW 59 kg  Protein:  65-83 g (1.1-1.4 g/kg)   Fluid:  1475-1770 mL (25-30 ml/kg)   EDUCATION NEEDS:   No education needs identified at this time  MODERATE Care Level  Irja Wheless B. Freida Busman, RD, LDN [phone] (pager)

## 2015-06-04 NOTE — Care Management Important Message (Signed)
Important Message  Patient Details  Name: [name] ShamsGlera B Tarter MRN: [id number] Date of Birth: 16-Feb-1935   Medicare Important Message Given:  Yes    Olegario MessierKathy A Vincient Vanaman 06/04/2015, 10:42 AM

## 2015-06-04 NOTE — Progress Notes (Signed)
*  PRELIMINARY RESULTS* Echocardiogram 2D Echocardiogram has been performed.  [name] Stuart 06/04/2015, 10:38 AM

## 2015-06-04 NOTE — Progress Notes (Signed)
Patient ID: [name] Stuart, female   DOB: September 03, 1934, 80 y.o.   MRN: [id number] [facility] PROGRESS NOTE  PCP: [facility] [place]  HPI/Subjective: Patient seen earlier in the day. Family and patient are interested in advancing diet. Feeling better. Breathing better. Still with some cough.  Objective: Filed Vitals:   06/04/15 0941 06/04/15 1345  BP: 141/68 138/72  Pulse: 66 91  Temp: 97.6 F (36.4 C) 97.8 F (36.6 C)  Resp:      Filed Weights   06/02/15 0416 06/03/15 0600 06/04/15 0501  Weight: 89.2 kg (196 lb 10.4 oz) 88.1 kg (194 lb 3.6 oz) 88.27 kg (194 lb 9.6 oz)    ROS: Review of Systems  Constitutional: Negative for fever and chills.  Eyes: Negative for blurred vision.  Respiratory: Positive for cough. Negative for shortness of breath.   Cardiovascular: Negative for chest pain.  Gastrointestinal: Negative for nausea, vomiting, abdominal pain, diarrhea and constipation.  Genitourinary: Negative for dysuria.  Musculoskeletal: Negative for joint pain.  Neurological: Negative for dizziness and headaches.  Psychiatric/Behavioral: The patient is not nervous/anxious.     Exam: Physical Exam  HENT:  Nose: No mucosal edema.  Mouth/Throat: No oropharyngeal exudate or posterior oropharyngeal edema.  Eyes: Conjunctivae and lids are normal. Pupils are equal, round, and reactive to light.  Neck: No JVD present. Carotid bruit is not present. No edema present. No thyroid mass and no thyromegaly present.  Cardiovascular: S1 normal and S2 normal.  Exam reveals no gallop.   No murmur heard. Pulses:      Dorsalis pedis pulses are 2+ on the right side, and 2+ on the left side.  Respiratory: No accessory muscle usage. No respiratory distress. She has no decreased breath sounds. She has no wheezes. She has rhonchi in the right lower field. She has no rales.  GI: Soft. Bowel sounds are normal. There is no tenderness.  Musculoskeletal:       Right ankle: She  exhibits swelling.       Left ankle: She exhibits swelling.  Lymphadenopathy:    She has no cervical adenopathy.  Neurological: She is alert.  Unable to straight leg raise  Skin: Skin is warm. Nails show no clubbing.  Chronic lower extremity discoloration  Psychiatric: She has a normal mood and affect.    Data Reviewed: Basic Metabolic Panel:  Recent Labs Lab 05/30/15 1151 05/31/15 0934 06/01/15 1525 06/02/15 0439 06/03/15 0452  NA 138 139 138 142 141  K 4.2 5.0 3.6 3.0* 4.1  CL 104 107 106 109 110  CO2 GLUCOSE 105* 108* 179* 115* 108*  BUN 11 16 23* 25* 23*  CREATININE 0.54 0.54 0.55 0.57 0.52  CALCIUM 8.5* 8.0* 8.0* 8.1* 8.3*  MG 2.2  --   --   --  1.9   Liver Function Tests:  Recent Labs Lab 05/30/15 1151  AST 13*  ALT 8*  ALKPHOS 120  BILITOT 0.6  PROT 5.8*  ALBUMIN 2.8*   CBC:  Recent Labs Lab 05/29/15 0625 05/31/15 0934 06/02/15 0439 06/03/15 0452  WBC 9.7 8.9 9.0 9.4  HGB 10.8* 12.0 11.3* 10.7*  HCT 33.5* 36.8 34.0* 32.9*  MCV 90.8 89.6 90.7 90.7  PLT 266 293 244 256    CBG:  Recent Labs Lab 06/03/15 2110 06/04/15 0026 06/04/15 0427 06/04/15 0744 06/04/15 1204  GLUCAP 106* 114* 115* 106* 138*    Recent Results (from the past 240 hour(s))  Blood culture (routine x  2)     Status: None   Collection Time: 05/27/15  6:51 AM  Result Value Ref Range Status   Specimen Description BLOOD  Final   Special Requests NONE  Final   Culture NO GROWTH 5 DAYS  Final   Report Status 06/01/2015 FINAL  Final  Urine culture     Status: None   Collection Time: 05/27/15  6:51 AM  Result Value Ref Range Status   Specimen Description URINE, CLEAN CATCH  Final   Special Requests NONE  Final   Culture NO GROWTH 2 DAYS  Final   Report Status 05/30/2015 FINAL  Final  Blood culture (routine x 2)     Status: None   Collection Time: 05/27/15  6:52 AM  Result Value Ref Range Status   Specimen Description BLOOD RIGHT HAND  Final   Special  Requests BOTTLES DRAWN AEROBIC AND ANAEROBIC  2CC  Final   Culture NO GROWTH 5 DAYS  Final   Report Status 06/01/2015 FINAL  Final  MRSA PCR Screening     Status: None   Collection Time: 05/27/15 12:27 PM  Result Value Ref Range Status   MRSA by PCR NEGATIVE NEGATIVE Final    Comment:        The GeneXpert MRSA Assay (FDA approved for NASAL specimens only), is one component of a comprehensive MRSA colonization surveillance program. It is not intended to diagnose MRSA infection nor to guide or monitor treatment for MRSA infections.   Culture, expectorated sputum-assessment     Status: None   Collection Time: 05/30/15  3:10 PM  Result Value Ref Range Status   Specimen Description EXPECTORATED SPUTUM  Final   Special Requests Normal  Final   Sputum evaluation THIS SPECIMEN IS ACCEPTABLE FOR SPUTUM CULTURE  Final   Report Status 05/30/2015 FINAL  Final  Culture, respiratory (NON-Expectorated)     Status: None   Collection Time: 05/30/15  3:10 PM  Result Value Ref Range Status   Specimen Description EXPECTORATED SPUTUM  Final   Special Requests Normal Reflexed from [id number]  Final   Gram Stain   Final    MODERATE WBC SEEN NO ORGANISMS SEEN GOOD SPECIMEN - 80-90% WBCS    Culture NO GROWTH 2 DAYS  Final   Report Status 06/01/2015 FINAL  Final     Studies: Dg Chest 1 View  06/03/2015  CLINICAL DATA:  80 year old female with shortness of Breath, Healthcare associated pneumonia. Initial encounter. EXAM: CHEST 1 VIEW COMPARISON:  06/02/2015 and earlier. FINDINGS: Portable AP semi upright view at 0553 hours. Endotracheal tube no longer identified. Enteric tube courses to the abdomen, side hole at the level of the gastric fundus. Stable cardiac size and mediastinal contours. Veiling opacity at the lung bases greater on the left. Continued obscuration of the left hemidiaphragm and dense retrocardiac opacity. No pneumothorax or overt edema. No areas of worsening ventilation. IMPRESSION: 1.  Extubated.  Enteric tube remains in place. 2. Stable lung volumes and ventilation with left greater than right pleural effusions and left lower lobe collapse/consolidation. Electronically Signed   By: Odessa Fleming M.D.   On: 06/03/2015 07:23   Dg Abd 1 View  06/02/2015  CLINICAL DATA:  Encounter for nasogastric tube placement. EXAM: ABDOMEN - 1 VIEW COMPARISON:  May 29, 2015 FINDINGS: The bowel gas pattern is normal. Nasogastric tube tip is seen in proximal stomach. IMPRESSION: Nasogastric tube tip seen in proximal stomach. Electronically Signed   By: Lupita Raider, M.D.   On:  06/02/2015 15:16    Scheduled Meds: . acetylcysteine  2 mL Nebulization Q4H  . amLODipine  5 mg Oral Daily  . budesonide (PULMICORT) nebulizer solution  0.25 mg Nebulization BID  . chlorhexidine gluconate  15 mL Mouth Rinse BID  . clopidogrel  75 mg Per Tube Daily  . escitalopram  10 mg Per Tube Daily  . famotidine (PEPCID) IV  20 mg Intravenous Q12H  . fluticasone  2 spray Each Nare Daily  . heparin subcutaneous  5,000 Units Subcutaneous 3 times per day  . hydrALAZINE  20 mg Intravenous Once  . insulin aspart  0-15 Units Subcutaneous 6 times per day  . ipratropium-albuterol  3 mL Nebulization Q4H  . miconazole  1 application Topical BID  . OLANZapine  2.5 mg Per Tube BID  . pravastatin  10 mg Per Tube QHS  . rOPINIRole  0.75 mg Per Tube QPM  . traZODone  150 mg Per Tube QHS  . valproic acid  250 mg Oral TID  . [START ON 06/25/2015] Vitamin D (Ergocalciferol)  50,000 Units Oral Q30 days    Assessment/Plan:  1. Clinical sepsis, aspiration pneumonia.-  pulmonary discontinued antibiotics. Patient is status post bronchoscopy removing Peas from right main stem bronchus. All cultures are negative. 2. Acute respiratory failure with hypoxia- extubated for second time 06/02/2015. Breathing comfortably on nasal cannula, is coughing more than yesterday.  3. Hyperglycemia on presentation- sugars have  improved 4. Hypocalcemia on presentation- this is also improved 5. Wheezing in lungs with history of COPD- nebulizer treatments and budesonide nebulizer , prednisone stopped by pulmonary 6. History of stroke- on Plavix and pravastatin 7. Dysphasia- remove NG tube and stop feeding. Advance diet to dysphagia 1 diet with honey thick liquids. We'll need to see how she does on this diet. Family hoping to avoid PEG tube placement.  8. Bradycardia on  previous telemetry monitoring   Code Status:     Code Status Orders        Start     Ordered   05/27/15 1226  Full code   Continuous     05/27/15 1226    Advance Directive Documentation        Most Recent Value   Type of Advance Directive  Healthcare Power of Attorney, Living will   Pre-existing out of facility DNR order (yellow form or pink MOST form)     "MOST" Form in Place?       Family Communication: Family at bedside Disposition Plan: Back to [facility] once stable.  Consultants:  Critical care specialist  Procedures:  Bronchoscopy  ET tube placement x2  Antibiotics:  Unasyn  course finished   Time spent: 20 minutes   Alford HighlandWIETING, [place]  [facility]

## 2015-06-04 NOTE — Plan of Care (Signed)
Problem: Fluid Volume: Goal: Hemodynamic stability will improve Outcome: Progressing Pt vital signs stable.   Problem: Physical Regulation: Goal: Signs and symptoms of infection will decrease Outcome: Progressing WBC count wnl. Pt afebrile   Problem: Respiratory: Goal: Ability to maintain adequate ventilation will improve Outcome: Progressing Pt oxygen saturations wnl on 2.5 liters   Problem: Safety: Goal: Ability to remain free from injury will improve Outcome: Progressing High fall risk. Pt did not exhibit any unsafe behaviors this shift. Bed alarm in place.   Problem: Skin Integrity: Goal: Risk for impaired skin integrity will decrease Outcome: Progressing Skin care protects utilized to promote skin integrity. Turned and repositioned. Incontinent of stool, pericare provided as well as foley catheter care.   Problem: Fluid Volume: Goal: Ability to maintain a balanced intake and output will improve Outcome: Progressing Pt has indwelling foley catheter. Will address the need to continue today. TF at 45 mls per hour with minimal residuals.   Problem: Nutrition: Goal: Adequate nutrition will be maintained Outcome: Progressing TF tolerated, minimal residuals.

## 2015-06-04 NOTE — Progress Notes (Signed)
   06/04/15 1300  Clinical Encounter Type  Visited With Patient and family together  Visit Type Initial  Consult/Referral To Chaplain  Provided pastoral presence and support to patient and her daughter on unit.  Chaplain Anetria Harwick-[phone]

## 2015-06-04 NOTE — Progress Notes (Signed)
[name]* [name]     Assessment and Plan:  89F NH resident X several years after R hemispheric CVA with prior aspiration PNAs admitted via ED with acute respiratory distress and hypoxemia. Intubated 11/23 for FOB which revealed aspirated food. Extubated 11/29   Acute respiratory failure, status post intubation.  Dysphagia with aspiration pneumonia. -Status post bronchus with aspiration of mainstem bronchus lesion - aspirated foreign body/material -The patient continues to have a weak cough, and appears to be at high risk of aspiration. -Continue antibiotics for aspiration coverage, course will end on December 2. -Prednisone  taper.  Pulmonary service will sign off for now, please call if there are further questions or concerns.  Date: 06/04/2015  MRN# [id number] [name] Stuart October 07, 1934   [name] Stuart is a 80 y.o. old female seen in follow up for chief complaint of  Chief Complaint  Patient presents with  . Altered Mental Status     HPI:  Patient is awake and alert, no new complaints today. She is speaking though slowly, she is coughing but has a very weak and somewhat ineffective cough.  Allergies:  Aspirin; Codeine sulfate; Ibuprofen; and Penicillins  Review of Systems: Gen:  Denies  fever, sweats. HEENT: Denies blurred vision. Cvc:  No dizziness, chest pain or heaviness Resp:   Denies cough or sputum porduction. Gi: Denies swallowing difficulty, stomach pain.  Gu:  Denies bladder incontinence, burning urine Ext:   No Joint pain, stiffness. Skin: No skin rash, easy bruising. Endoc:  No polyuria, polydipsia. Psych: No depression, insomnia. Other:  All other systems were reviewed and found to be negative other than what is mentioned in the HPI.   Physical Examination:   VS: BP 138/72 mmHg  Pulse 91  Temp(Src) 97.8 F (36.6 C) (Oral)  Resp 18  Ht 5\' 6"  (1.676 m)  Wt 88.27 kg (194 lb 9.6 oz)  BMI 31.42 kg/m2  SpO2 95%  General  Appearance: No distress  Neuro:without focal findings,  speech normal,  HEENT: PERRLA, EOM intact. Pulmonary: normal breath sounds, No wheezing.   CardiovascularNormal S1,S2.  No m/r/g.   Abdomen: Benign, Soft, non-tender. Renal:  No costovertebral tenderness  GU:  Not performed at this time. Endoc: No evident thyromegaly, no signs of acromegaly. Skin:   warm, no rash. Extremities: normal, no cyanosis, clubbing.   LABORATORY PANEL:   CBC  Recent Labs Lab 06/03/15 0452  WBC 9.4  HGB 10.7*  HCT 32.9*  PLT 256   ------------------------------------------------------------------------------------------------------------------  Chemistries   Recent Labs Lab 05/30/15 1151  06/03/15 0452  NA 138  < > 141  K 4.2  < > 4.1  CL 104  < > 110  CO2 24  < > 26  GLUCOSE 105*  < > 108*  BUN 11  < > 23*  CREATININE 0.54  < > 0.52  CALCIUM 8.5*  < > 8.3*  MG 2.2  --  1.9  AST 13*  --   --   ALT 8*  --   --   ALKPHOS 120  --   --   BILITOT 0.6  --   --   < > = values in this interval not displayed. ------------------------------------------------------------------------------------------------------------------  Cardiac Enzymes No results for input(s): TROPONINI in the last 168 hours. ------------------------------------------------------------  RADIOLOGY:   No results found for this or any previous visit. Results for orders placed during the hospital encounter of 05/03/15  DG Chest 2 View   Narrative CLINICAL DATA:  Choked  on food today. Presents with cough and congestion.  EXAM: CHEST  2 VIEW  COMPARISON:  07/30/2013  FINDINGS: The heart size and mediastinal contours are within normal limits. Both lungs are clear. The visualized skeletal structures are unremarkable. Small hiatal hernia noted.  IMPRESSION: No active cardiopulmonary disease.   Electronically Signed   By: Signa Kell M.D.   On: 05/03/2015 13:40     ------------------------------------------------------------------------------------------------------------------  Thank  you for allowing [facility] to assist in the care of your patient. Our recommendations are noted above.  Please contact us if we can be of further service.   Wells Guiles, MD.  [facility]  Santiago Glad, M.D.  Stephanie Acre, M.D.  Billy Fischer, M.D

## 2015-06-04 NOTE — Plan of Care (Signed)
Problem: Bowel/Gastric: Goal: Will not experience complications related to bowel motility Outcome: Progressing Patient given oral anxiety med with noted relief. VSS. NG tube removed without complications. Foley removed, patient urinating without problems.

## 2015-06-05 LAB — GLUCOSE, CAPILLARY
GLUCOSE-CAPILLARY: 72 mg/dL (ref 65–99)
GLUCOSE-CAPILLARY: 80 mg/dL (ref 65–99)
GLUCOSE-CAPILLARY: 90 mg/dL (ref 65–99)
Glucose-Capillary: 73 mg/dL (ref 65–99)

## 2015-06-05 LAB — BASIC METABOLIC PANEL
ANION GAP: 5 (ref 5–15)
BUN: 16 mg/dL (ref 6–20)
CHLORIDE: 103 mmol/L (ref 101–111)
CO2: 29 mmol/L (ref 22–32)
Calcium: 8.5 mg/dL — ABNORMAL LOW (ref 8.9–10.3)
Creatinine, Ser: 0.62 mg/dL (ref 0.44–1.00)
GFR calc Af Amer: >60 mL/min (ref >60)
GLUCOSE: 85 mg/dL (ref 65–99)
POTASSIUM: 4.3 mmol/L (ref 3.5–5.1)
Sodium: 137 mmol/L (ref 135–145)

## 2015-06-05 MED ORDER — BUDESONIDE 0.25 MG/2ML IN SUSP: 0.2500 mg | Freq: Two times a day (BID) | RESPIRATORY_TRACT | Status: AC

## 2015-06-05 MED ORDER — VALPROIC ACID 250 MG PO CAPS: 250.0000 mg | ORAL_CAPSULE | Freq: Three times a day (TID) | ORAL | Status: DC

## 2015-06-05 MED ORDER — TRAMADOL HCL 50 MG PO TABS: 50.0000 mg | ORAL_TABLET | Freq: Four times a day (QID) | ORAL | Status: AC | PRN

## 2015-06-05 MED ORDER — ALBUTEROL SULFATE (2.5 MG/3ML) 0.083% IN NEBU: 2.5000 mg | INHALATION_SOLUTION | Freq: Four times a day (QID) | RESPIRATORY_TRACT | Status: DC

## 2015-06-05 MED ORDER — VALPROIC ACID 250 MG PO CAPS
250.0000 mg | ORAL_CAPSULE | Freq: Three times a day (TID) | ORAL | Status: DC
  Filled 2015-06-05 (×3): qty 1

## 2015-06-05 MED ORDER — ALPRAZOLAM 0.25 MG PO TABS: 0.2500 mg | ORAL_TABLET | Freq: Four times a day (QID) | ORAL | Status: DC | PRN

## 2015-06-05 NOTE — Plan of Care (Signed)
Problem: Respiratory: Goal: Ability to maintain adequate ventilation will improve Outcome: Progressing Pt continues on 2.5 ml/hr nasal cannula. Rhonchi present.  Problem: Safety: Goal: Ability to remain free from injury will improve Outcome: Progressing High Fall risk, pt encouraged to call for assistance, hourly rounding. Pt remains incontinent.  Problem: Nutrition: Goal: Adequate nutrition will be maintained Outcome: Progressing Pt is tolerating current diet, able to take pills crushed in applesauce.

## 2015-06-05 NOTE — Evaluation (Signed)
Occupational Therapy Evaluation Patient Details Name: [name] Stuart MRN: [id number] DOB: 26-Jan-1935 Today's Date: 06/05/2015 (late entry for 06/04/2015)    History of Present Illness Pt is a 80 y/o female that has been a SNF resident ~ 2 years now, she went home for the holidays with family and became short of breath. She was intubated on this admission, and is now extubated. At baseline she has been transferring via an overhead lift to a wheelchair and is able to feed herself.  Patient intubated again on 11/25 and was extubated on 11/29.    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 Stretched to extension. Danger of skin break down in both elbows.  Instructed husband in stretching to keep skin from touching skin and risk of skin break down.    Follow Up Recommendations       Equipment Recommendations       Recommendations for Other Services       Precautions / Restrictions Restrictions Weight Bearing Restrictions: No      Mobility Bed Mobility                  Transfers                      Balance                                            ADL                                         General ADL Comments: Patient has been dependent for most ADL for the last 2 years including hoyer lift for transfers.     Vision     Perception     Praxis      Pertinent Vitals/Pain Pain Assessment: No/denies pain     Hand Dominance Left   Extremity/Trunk Assessment Upper Extremity Assessment Upper Extremity Assessment: RUE deficits/detail;LUE deficits/detail RUE Deficits / Details: R UE active shoulder flexion to 90o, elbow extension 85o, hand motions 40%  LUE Deficits / Details: Minimal movement   Lower Extremity Assessment Lower Extremity Assessment: Defer to PT evaluation       Communication     Cognition Arousal/Alertness: Awake/alert Behavior During Therapy: [facility] for tasks assessed/performed                       General Comments       Exercises       Shoulder Instructions      Home Living Family/patient expects to be discharged to:: Skilled nursing facility                                        Prior Functioning/Environment Level of Independence: Needs assistance  Gait / Transfers Assistance Needed: Pt has used a lift to transfer to a w/c (more for positioning, she is unable to complete mobility in chair).           OT Diagnosis: Generalized weakness   OT Problem List:     OT Treatment/Interventions: Manual therapy    OT Goals(Current goals can be found in the care plan section) Acute Rehab OT  Goals Patient Stated Goal: To return to [facility]  OT Goal Formulation: With patient/family Time For Goal Achievement: 06/19/15 Potential to Achieve Goals: Good  OT Frequency: Min 1X/week   Barriers to D/C:            Co-evaluation              End of Session    Activity Tolerance:   Patient left:     Time: 1610-9604 OT Time Calculation (min): 20 min Charges:  OT General Charges $OT Visit: 1 Procedure OT Evaluation $Initial OT Evaluation Tier I: 1 Procedure OT Treatments $Therapeutic Activity Peds: 8-22 mins G-Codes:    [name], MS/OTR/L  06/05/2015, 9:33 AM

## 2015-06-05 NOTE — [place] FL2 (Signed)
[place] MEDICAID FL2 LEVEL OF CARE SCREENING TOOL     IDENTIFICATION  Patient Name: [name] Stuart Birthdate: 09-26-1934 Sex: female Admission Date (Current Location): 05/27/2015  [facility] [place]:  [address]([place] )  ([id number]) Facility and Address:  [facility], [address], [place], [place] [address]      Provider Number: [id number]  Attending Physician Name and Address:  Alford Highlandichard Wieting, MD  Relative Name and Phone Number:       Current Level of Care: Hospital Recommended Level of Care: Skilled Nursing Facility Prior Approval Number:    Date Approved/Denied:   PASRR Number:  [phone])  Discharge Plan: SNF    Current Diagnoses: Patient Active Problem List   Diagnosis Date Noted  . Mixed bipolar I disorder (HCC) 06/03/2015  . Aspiration pneumonia of right upper lobe (HCC)   . Respiratory failure (HCC)   . HCAP (healthcare-associated pneumonia) 05/27/2015  . Aspiration of food     Orientation ACTIVITIES/SOCIAL BLADDER RESPIRATION    Self, Time, Situation, Place    Indwelling catheter    BEHAVIORAL SYMPTOMS/MOOD NEUROLOGICAL BOWEL NUTRITION STATUS      Continent Diet (puree)  PHYSICIAN VISITS COMMUNICATION OF NEEDS Height & Weight Skin    Verbally 5\' 6"  (167.6 cm) 150 lbs. Normal          AMBULATORY STATUS RESPIRATION    Assist extensive        Personal Care Assistance Level of Assistance  Bathing, Feeding, Dressing Bathing Assistance: Maximum assistance Feeding assistance: Limited assistance Dressing Assistance: Maximum assistance      Functional Limitations Info  Sight, Hearing, Speech Sight Info: Adequate Hearing Info: Adequate Speech Info: Impaired       SPECIAL CARE FACTORS FREQUENCY  Speech therapy                   Additional Factors Info  Allergies, Psychotropic   Allergies Info:  (Aspirin, Codeine Sulfate, Ibuprofen, Penicillins)           Current Medications (06/05/2015):   This is the current hospital active medication list Current Facility-Administered Medications  Medication Dose Route Frequency Provider Last Rate Last Dose  . acetaminophen (TYLENOL) solution 650 mg  650 mg Per Tube Q4H PRN Merwyn Katosavid B Simonds, MD   650 mg at 05/30/15 2115  . acetylcysteine (MUCOMYST) 20 % nebulizer / oral solution 2 mL  2 mL Nebulization Q4H Shane CrutchPradeep Ramachandran, MD   2 mL at 06/05/15 0333  . albuterol (PROVENTIL) (2.5 MG/3ML) 0.083% nebulizer solution 2.5 mg  2.5 mg Nebulization Q3H PRN Altamese DillingVaibhavkumar Vachhani, MD   2.5 mg at 05/29/15 1417  . ALPRAZolam Prudy Feeler(XANAX) tablet 0.25 mg  0.25 mg Oral Q6H PRN Altamese DillingVaibhavkumar Vachhani, MD   0.25 mg at 06/04/15 1649  . amLODipine (NORVASC) tablet 5 mg  5 mg Oral Daily Alford Highlandichard Wieting, MD   5 mg at 06/05/15 1025  . budesonide (PULMICORT) nebulizer solution 0.25 mg  0.25 mg Nebulization BID Alford Highlandichard Wieting, MD   0.25 mg at 06/04/15 2008  . chlorhexidine gluconate (PERIDEX) 0.12 % solution 15 mL  15 mL Mouth Rinse BID Merwyn Katosavid B Simonds, MD   15 mL at 06/05/15 1026  . clopidogrel (PLAVIX) tablet 75 mg  75 mg Per Tube Daily Merwyn Katosavid B Simonds, MD   75 mg at 06/05/15 1025  . escitalopram (LEXAPRO) tablet 10 mg  10 mg Per Tube Daily Audery AmelJohn T Clapacs, MD   10 mg at 06/05/15 1025  . famotidine (PEPCID) IVPB 20 mg  premix  20 mg Intravenous Q12H Merwyn Katos, MD   20 mg at 06/05/15 1023  . fluticasone (FLONASE) 50 MCG/ACT nasal spray 2 spray  2 spray Each Nare Daily Altamese Dilling, MD   2 spray at 06/05/15 1024  . heparin injection 5,000 Units  5,000 Units Subcutaneous 3 times per day Shane Crutch, MD   5,000 Units at 06/05/15 0531  . hydrALAZINE (APRESOLINE) injection 20 mg  20 mg Intravenous Once Stephanie Acre, MD   Stopped at 05/29/15 1736  . insulin aspart (novoLOG) injection 0-15 Units  0-15 Units Subcutaneous 6 times per day Merwyn Katos, MD   2 Units at 06/04/15 1228  . ipratropium-albuterol (DUONEB) 0.5-2.5 (3) MG/3ML nebulizer solution 3 mL   3 mL Nebulization Q4H PRN Vishal Mungal, MD      . ipratropium-albuterol (DUONEB) 0.5-2.5 (3) MG/3ML nebulizer solution 3 mL  3 mL Nebulization Q4H Shane Crutch, MD   3 mL at 06/05/15 0333  . miconazole (MICOTIN) 2 % cream 1 application  1 application Topical BID Altamese Dilling, MD   1 application at 06/05/15 1027  . OLANZapine (ZYPREXA) tablet 2.5 mg  2.5 mg Per Tube BID Merwyn Katos, MD   2.5 mg at 06/05/15 1025  . polyethylene glycol (MIRALAX / GLYCOLAX) packet 17 g  17 g Per Tube Daily PRN Merwyn Katos, MD      . pravastatin (PRAVACHOL) tablet 10 mg  10 mg Per Tube QHS Merwyn Katos, MD   10 mg at 06/04/15 2232  . rOPINIRole (REQUIP) tablet 0.75 mg  0.75 mg Per Tube QPM Merwyn Katos, MD   0.75 mg at 06/04/15 1649  . traMADol (ULTRAM) tablet 50 mg  50 mg Oral Q6H PRN Stephanie Acre, MD   50 mg at 06/04/15 1209  . traZODone (DESYREL) tablet 150 mg  150 mg Per Tube QHS Merwyn Katos, MD   150 mg at 06/04/15 2232  . valproic acid (DEPAKENE) 250 MG capsule 250 mg  250 mg Oral TID Alford Highland, MD      . Melene Muller ON 06/25/2015] Vitamin D (Ergocalciferol) (DRISDOL) capsule 50,000 Units  50,000 Units Oral Q30 days Altamese Dilling, MD         Discharge Medications: Please see discharge summary for a list of discharge medications.  Relevant Imaging Results:  Relevant Lab Results:  Recent Labs    Additional Information  (SS: [id number])  Dede Query, LCSW

## 2015-06-05 NOTE — Clinical Social Work Note (Signed)
Pt is ready for discharge today to [facility]. Facility has received discharge information and is ready to admit pt. Pt's husband is aware and agreeable to discharge plan. RN will call report and EMS will provide transportation. CSW is signing off as no further needs identified.   Dede QuerySarah Stefani Baik, MSW, LCSW Clinical Social Worker  [phone]

## 2015-06-05 NOTE — Progress Notes (Signed)
Patient discharged to [facility]. VSS. Report given to Selena BattenKim at facility. EMS called for transport.

## 2015-06-05 NOTE — Discharge Instructions (Signed)
Dysphagia 1 diet with honey thickened liquids. Can have mighty shake and Magic cup as long as honey thick consistency. Speech therapy follow up at facility

## 2015-06-05 NOTE — Discharge Summary (Signed)
[facility] [place] [facility]   PATIENT NAME: [name] Stuart    MR#:  [id number]  DATE OF BIRTH:  [birth year]  DATE OF ADMISSION:  05/27/2015 ADMITTING PHYSICIAN: Altamese Dilling, MD  DATE OF DISCHARGE: 06/05/2015  PRIMARY CARE PHYSICIAN: [facility]    ADMISSION DIAGNOSIS:  Hypocalcemia [E83.51] Lactic acidosis [E87.2] Hyponatremia [E87.1] Lung nodules [R91.8] Severe sepsis with acute organ dysfunction (HCC) [A41.9, R65.20] Aspiration pneumonia of right upper lobe, unspecified aspiration pneumonia type (HCC) [J69.0]  DISCHARGE DIAGNOSIS:  Principal Problem:   Mixed bipolar I disorder (HCC) Active Problems:   HCAP (healthcare-associated pneumonia)   Aspiration of food   Aspiration pneumonia of right upper lobe (HCC)   Respiratory failure (HCC)   SECONDARY DIAGNOSIS:   Past Medical History  Diagnosis Date  . Other specified rehabilitation procedure(V57.89)   . Unspecified constipation   . Esophageal reflux   . Chronic airway obstruction, not elsewhere classified   . Other specified disorder of skin   . Other symptoms involving skin and integumentary tissues   . Difficulty in walking(719.7)   . Muscle weakness (generalized)   . Dysphagia, oropharyngeal phase   . Cognitive communication deficit   . Anxiety state, unspecified   . Unspecified essential hypertension   . Unspecified late effects of cerebrovascular disease   . Depressive disorder, not elsewhere classified   . Other and unspecified hyperlipidemia   . Restless legs syndrome (RLS)   . Other chronic pain   . Insomnia, unspecified   . Pressure ulcer, buttock(707.05)   . Unspecified episodic mood disorder   . Stroke [facility])     HOSPITAL COURSE:   1. Clinical sepsis, aspiration pneumonia. Patient finished a full antibiotic course of Unasyn while here. Patient required a bronchoscopy which removed pedis from the right mainstem bronchus. All cultures are  negative. Pulmonary signed off. 2. Acute respiratory failure with hypoxia. Patient was intubated and then extubated and reintubated. Patient was extubated for second time on 06/02/2015. She is breathing comfortably on nasal cannula which will be continued at the facility. Oxygen 24/7 2 L nasal cannula. Patient is able to cough at this point. 3. COPD with exacerbation- patient was given tapering dose of prednisone while here. I will continue budesonide nebulizer and albuterol nebulizers standing dose. 4. History of stroke on Plavix and pravastatin 5. Dysphagia- patient is on dysphagia 1 diet and honey thickened liquids. Follow up with facility speech therapy. Patient is high risk for continued aspiration. Family hoping to avoid feeding tube. 6. Bradycardia on previous telemetry no further workup 7. Hyperglycemia on presentation sugars have improved 8. Hypocalcemia on presentation this is also improved 9. Restless leg syndrome- on Requip 10. Bipolar disorder on valproic acid  DISCHARGE CONDITIONS:   Fair  CONSULTS OBTAINED:  Treatment Team:  Iran Ouch, MD Audery Amel, MD  DRUG ALLERGIES:   Allergies  Allergen Reactions  . Aspirin   . Codeine Sulfate   . Ibuprofen   . Penicillins     DISCHARGE MEDICATIONS:   Current Discharge Medication List    START taking these medications   Details  budesonide (PULMICORT) 0.25 MG/2ML nebulizer solution Take 2 mLs (0.25 mg total) by nebulization 2 (two) times daily. Qty: 60 mL, Refills: 12    valproic acid (DEPAKENE) 250 MG capsule Take 1 capsule (250 mg total) by mouth 3 (three) times daily. Qty: 90 capsule, Refills: 0      CONTINUE these medications which have CHANGED   Details  albuterol (PROVENTIL) (2.5 MG/3ML) 0.083% nebulizer solution Take 3 mLs (2.5 mg total) by nebulization 4 (four) times daily. Qty: 75 mL, Refills: 12    ALPRAZolam (XANAX) 0.25 MG tablet Take 1 tablet (0.25 mg total) by mouth every 6 (six) hours as  needed for anxiety. Qty: 30 tablet, Refills: 0    traMADol (ULTRAM) 50 MG tablet Take 1 tablet (50 mg total) by mouth every 6 (six) hours as needed for moderate pain or severe pain. Qty: 30 tablet, Refills: 0      CONTINUE these medications which have NOT CHANGED   Details  acetaminophen (TYLENOL) 325 MG tablet Take 650 mg by mouth every 4 (four) hours as needed for mild pain, moderate pain or fever.     amLODipine (NORVASC) 2.5 MG tablet Take 2.5 mg by mouth daily.    clopidogrel (PLAVIX) 75 MG tablet Take 75 mg by mouth daily.     escitalopram (LEXAPRO) 10 MG tablet Take 10 mg by mouth daily.    fluticasone (FLONASE) 50 MCG/ACT nasal spray Place 2 sprays into both nostrils daily.    miconazole (MICOTIN) 2 % cream Apply 1 application topically 2 (two) times daily. To buttocks and sacrum    nystatin (MYCOSTATIN/NYSTOP) 100000 UNIT/GM POWD Apply topically 2 (two) times daily. To groin    OLANZapine (ZYPREXA) 2.5 MG tablet Take 2.5 mg by mouth 2 (two) times daily.    omeprazole (PRILOSEC) 20 MG capsule Take 20 mg by mouth every morning.     polyethylene glycol (MIRALAX / GLYCOLAX) packet Take 17 g by mouth daily as needed for mild constipation, moderate constipation or severe constipation.     pravastatin (PRAVACHOL) 10 MG tablet Take 10 mg by mouth at bedtime.     rOPINIRole (REQUIP) 0.25 MG tablet Take 0.75 mg by mouth every evening.    traZODone (DESYREL) 150 MG tablet Take 150 mg by mouth at bedtime.    Vitamin D, Ergocalciferol, (DRISDOL) 50000 UNITS CAPS capsule Take 50,000 Units by mouth every 30 (thirty) days. On the 22nd of each month      STOP taking these medications     divalproex (DEPAKOTE ER) 250 MG 24 hr tablet          DISCHARGE INSTRUCTIONS:   Follow-up with Dr. rehabilitation 1 day  If you experience worsening of your admission symptoms, develop shortness of breath, life threatening emergency, suicidal or homicidal thoughts you must seek medical  attention immediately by calling 911 or calling your MD immediately  if symptoms less severe.  You Must read complete instructions/literature along with all the possible adverse reactions/side effects for all the Medicines you take and that have been prescribed to you. Take any new Medicines after you have completely understood and accept all the possible adverse reactions/side effects.   Please note  You were cared for by a hospitalist during your hospital stay. If you have any questions about your discharge medications or the care you received while you were in the hospital after you are discharged, you can call the unit and asked to speak with the hospitalist on call if the hospitalist that took care of you is not available. Once you are discharged, your primary care physician will handle any further medical issues. Please note that NO REFILLS for any discharge medications will be authorized once you are discharged, as it is imperative that you return to your primary care physician (or establish a relationship with a primary care physician if you do not have one) for  your aftercare needs so that they can reassess your need for medications and monitor your lab values.    Today   CHIEF COMPLAINT:   Chief Complaint  Patient presents with  . Altered Mental Status    HISTORY OF PRESENT ILLNESS:  [name] Stuart  is a 81 y.o. female with a known history of stroke presented with altered mental status and acute respiratory failure and was intubated   VITAL SIGNS:  Blood pressure 122/48, pulse 65, temperature 98.4 F (36.9 C), temperature source Oral, resp. rate 18, height  (1.676 m), weight 83.19 kg (183 lb 6.4 oz), SpO2 99 %.    PHYSICAL EXAMINATION:  GENERAL:  80 y.o.-year-old patient lying in the bed with no acute distress.  EYES: Pupils equal, round, reactive to light and accommodation. No scleral icterus. Extraocular muscles intact.  HEENT: Head atraumatic, normocephalic.  Oropharynx and nasopharynx clear.  NECK:  Supple, no jugular venous distention. No thyroid enlargement, no tenderness.  LUNGS: Decreased breath sounds bilaterally, scattered rhonchi. No use of accessory muscles of respiration.  CARDIOVASCULAR: S1, S2 normal. No murmurs, rubs, or gallops.  ABDOMEN: Soft, non-tender, non-distended. Bowel sounds present. No organomegaly or mass.  EXTREMITIES: No pedal edema, cyanosis, or clubbing.  NEUROLOGIC: Answers all questions appropriately PSYCHIATRIC: The patient is alert.  SKIN: No obvious rash, lesion, or ulcer.   DATA REVIEW:   CBC  Recent Labs Lab 06/03/15 0452  WBC 9.4  HGB 10.7*  HCT 32.9*  PLT 256    Chemistries   Recent Labs Lab 05/30/15 1151  06/03/15 0452 06/05/15 0526  NA 138  < > 141 137  K 4.2  < > 4.1 4.3  CL 104  < > 110 103  CO2 24  < > 26 29  GLUCOSE 105*  < > 108* 85  BUN 11  < > 23* 16  CREATININE 0.54  < > 0.52 0.62  CALCIUM 8.5*  < > 8.3* 8.5*  MG 2.2  --  1.9  --   AST 13*  --   --   --   ALT 8*  --   --   --   ALKPHOS 120  --   --   --   BILITOT 0.6  --   --   --   < > = values in this interval not displayed.  Cardiac Enzymes No results for input(s): TROPONINI in the last 168 hours.  Microbiology Results  Results for orders placed or performed during the hospital encounter of 05/27/15  Blood culture (routine x 2)     Status: None   Collection Time: 05/27/15  6:51 AM  Result Value Ref Range Status   Specimen Description BLOOD  Final   Special Requests NONE  Final   Culture NO GROWTH 5 DAYS  Final   Report Status 06/01/2015 FINAL  Final  Urine culture     Status: None   Collection Time: 05/27/15  6:51 AM  Result Value Ref Range Status   Specimen Description URINE, CLEAN CATCH  Final   Special Requests NONE  Final   Culture NO GROWTH 2 DAYS  Final   Report Status 05/30/2015 FINAL  Final  Blood culture (routine x 2)     Status: None   Collection Time: 05/27/15  6:52 AM  Result Value Ref Range  Status   Specimen Description BLOOD RIGHT HAND  Final   Special Requests BOTTLES DRAWN AEROBIC AND ANAEROBIC  2CC  Final   Culture NO GROWTH 5  DAYS  Final   Report Status 06/01/2015 FINAL  Final  MRSA PCR Screening     Status: None   Collection Time: 05/27/15 12:27 PM  Result Value Ref Range Status   MRSA by PCR NEGATIVE NEGATIVE Final    Comment:        The GeneXpert MRSA Assay (FDA approved for NASAL specimens only), is one component of a comprehensive MRSA colonization surveillance program. It is not intended to diagnose MRSA infection nor to guide or monitor treatment for MRSA infections.   Culture, expectorated sputum-assessment     Status: None   Collection Time: 05/30/15  3:10 PM  Result Value Ref Range Status   Specimen Description EXPECTORATED SPUTUM  Final   Special Requests Normal  Final   Sputum evaluation THIS SPECIMEN IS ACCEPTABLE FOR SPUTUM CULTURE  Final   Report Status 05/30/2015 FINAL  Final  Culture, respiratory (NON-Expectorated)     Status: None   Collection Time: 05/30/15  3:10 PM  Result Value Ref Range Status   Specimen Description EXPECTORATED SPUTUM  Final   Special Requests Normal Reflexed from [id number]  Final   Gram Stain   Final    MODERATE WBC SEEN NO ORGANISMS SEEN GOOD SPECIMEN - 80-90% WBCS    Culture NO GROWTH 2 DAYS  Final   Report Status 06/01/2015 FINAL  Final    Management plans discussed with the patient, family and they are in agreement.  CODE STATUS:     Code Status Orders        Start     Ordered   05/27/15 1226  Full code   Continuous     05/27/15 1226    Advance Directive Documentation        Most Recent Value   Type of Advance Directive  Healthcare Power of Attorney, Living will   Pre-existing out of facility DNR order (yellow form or pink MOST form)     "MOST" Form in Place?        TOTAL TIME TAKING CARE OF THIS PATIENT: 35 minutes.    Alford HighlandWIETING, Mekesha Solomon M.D on 06/05/2015 at 11:30 AM  Between 7am to 6pm -  Pager - [phone]  After 6pm go to [email or web address] - password [email or web address] [facility]  [facility]  Office  [phone]  CC: Primary care physician; [facility] [place]

## 2015-06-05 NOTE — Progress Notes (Signed)
Occupational Therapy Treatment Patient Details Name: [name] Stuart MRN: [id number] DOB: 02/21/1935 Today's Date: 06/05/2015    History of present illness Pt is a 80 y/o female that has been a SNF resident ~ 2 years now, she went home for the holidays with family and became short of breath. She was intubated on this admission, and is now extubated. At baseline she has been transferring via an overhead lift to a wheelchair and is able to feed herself.  Patient intubated again on 11/25 and was extubated on 11/29.    OT comments  Daughter present, taught daughter passive stretching of elbows and hands to help prevent skin breakdown as skin is touching skin in palms and inner elbow.  Follow Up Recommendations       Equipment Recommendations       Recommendations for Other Services      Precautions / Restrictions Restrictions Weight Bearing Restrictions: No       Mobility Bed Mobility                  Transfers                      Balance                                   ADL                                                Vision                     Perception     Praxis      Cognition   Behavior During Therapy: WFL for tasks assessed/performed                         Extremity/Trunk Assessment               Exercises     Shoulder Instructions       General Comments      Pertinent Vitals/ Pain          Home Living                                          Prior Functioning/Environment              Frequency       Progress Toward Goals  OT Goals(current goals can now be found in the care plan section)        Plan      Co-evaluation                 End of Session     Activity Tolerance     Patient Left in bed;with call bell/phone within reach;with family/visitor present   Nurse Communication          Time: 2952-84131417-1428 OT Time  Calculation (min): 11 min  Charges: OT General Charges $OT Visit: 1 Procedure OT Treatments $Therapeutic Activity: 8-22 mins  Gwyndolyn KaufmanHuff, Virginie Josten M Dyrell Tuccillo M Eulises Kijowski, MS/OTR/L  06/05/2015, 2:33 PM

## 2015-08-30 ENCOUNTER — Other Ambulatory Visit
Admission: RE | Admit: 2015-08-30 | Discharge: 2015-08-30 | Disposition: A | Discharge status: Home / Self Care | Payer: Medicare Other | Source: Ambulatory Visit | Attending: Family Medicine | Admitting: Family Medicine

## 2015-08-30 DIAGNOSIS — N390 Urinary tract infection, site not specified: Secondary | ICD-10-CM | POA: Insufficient documentation

## 2015-08-30 LAB — URINALYSIS COMPLETE WITH MICROSCOPIC (ARMC ONLY)
BILIRUBIN URINE: NEGATIVE
Bacteria, UA: NONE SEEN
GLUCOSE, UA: NEGATIVE mg/dL
HGB URINE DIPSTICK: NEGATIVE
Ketones, ur: NEGATIVE mg/dL
Nitrite: NEGATIVE
Protein, ur: NEGATIVE mg/dL
Specific Gravity, Urine: 1.01 (ref 1.005–1.030)
pH: 6 (ref 5.0–8.0)

## 2015-09-01 LAB — URINE CULTURE

## 2015-09-29 ENCOUNTER — Other Ambulatory Visit: Payer: Self-pay | Admitting: Family Medicine

## 2015-09-29 DIAGNOSIS — R1310 Dysphagia, unspecified: Secondary | ICD-10-CM

## 2015-10-05 ENCOUNTER — Ambulatory Visit
Admission: RE | Admit: 2015-10-05 | Discharge: 2015-10-05 | Disposition: A | Discharge status: Home / Self Care | Payer: Medicare Other | Source: Ambulatory Visit | Attending: Family Medicine | Admitting: Family Medicine

## 2015-10-05 DIAGNOSIS — R1312 Dysphagia, oropharyngeal phase: Secondary | ICD-10-CM | POA: Diagnosis not present

## 2015-10-05 DIAGNOSIS — R1310 Dysphagia, unspecified: Secondary | ICD-10-CM

## 2015-10-05 NOTE — Therapy (Signed)
[facility] DIAGNOSTIC RADIOLOGY [address] [place], [place], [address] Phone: [phone]   Fax:     Modified Barium Swallow  Patient Details  Name: [name] Stuart MRN: [id number] Date of Birth: [birth year] No Data Recorded  Encounter Date: 10/05/2015      End of Session - 10/05/15 1408    Visit Number 1   Number of Visits 1   Date for SLP Re-Evaluation 10/05/15   SLP Start Time 1230   SLP Stop Time  1330   SLP Time Calculation (min) 60 min   Activity Tolerance Patient tolerated treatment well      Past Medical History  Diagnosis Date  . Other specified rehabilitation procedure(V57.89)   . Unspecified constipation   . Esophageal reflux   . Chronic airway obstruction, not elsewhere classified   . Other specified disorder of skin   . Other symptoms involving skin and integumentary tissues   . Difficulty in walking(719.7)   . Muscle weakness (generalized)   . Dysphagia, oropharyngeal phase   . Cognitive communication deficit   . Anxiety state, unspecified   . Unspecified essential hypertension   . Unspecified late effects of cerebrovascular disease   . Depressive disorder, not elsewhere classified   . Other and unspecified hyperlipidemia   . Restless legs syndrome (RLS)   . Other chronic pain   . Insomnia, unspecified   . Pressure ulcer, buttock(707.05)   . Unspecified episodic mood disorder   . Stroke [facility])     Past Surgical History  Procedure Laterality Date  . Cholecystectomy    . Bony pelvis surgery      There were no vitals filed for this visit.  Visit Diagnosis: Dysphagia - Plan: DG SWALLOWING FUNC-SPEECH PATHOLOGY, DG SWALLOWING FUNC-SPEECH PATHOLOGY   Subjective: Patient behavior: (alertness, ability to follow instructions, etc.): Patient responds to questions but appears to go to sleep when not engaged.  Chief complaint: Patient has history of CVA/dysphagia and aspiration pneumonia.  She has a previous MBS  at this facility 06/03/2015.   Objective:  Radiological Procedure: A videoflouroscopic evaluation of oral-preparatory, reflex initiation, and pharyngeal phases of the swallow was performed; as well as a screening of the upper esophageal phase.  I. POSTURE: Upright in MBS chair; patient head was strapped up to avoid should shadow of the pharyngeal/laryngeal aspects of swallowing  II. VIEW: Lateral  III. COMPENSATORY STRATEGIES: N/A  IV. BOLUSES ADMINISTERED:    Thin Liquid: NT   Nectar-thick Liquid: NT   Honey-thick Liquid: 2 teaspoon presentations    Puree: 2 teaspoon presentations   Mechanical Soft: NT  V. RESULTS OF EVALUATION: A. ORAL PREPARATORY PHASE: (The lips, tongue, and velum are observed for strength and coordination)       **Overall Severity Rating: Moderate; decreased bolus control with premature spillage over base of tongue  B. SWALLOW INITIATION/REFLEX: (The reflex is normal if "triggered" by the time the bolus reached the base of the tongue)  **Overall Severity Rating: Severe; triggers at pyriform sinuses   C. PHARYNGEAL PHASE: (Pharyngeal function is normal if the bolus shows rapid, smooth, and continuous transit through the pharynx and there is no pharyngeal residue after the swallow)  **Overall Severity Rating: Moderate; decreased hyolaryngeal movement, incomplete epiglottic inversion, decreased laryngeal vestibule closure at the height of the swallow  D. LARYNGEAL PENETRATION: (Material entering into the laryngeal inlet/vestibule but not aspirated) None with puree solid (applesauce); laryngeal vestibule residue with honey-thick liquid   E. ASPIRATION: Silent, during the swallow,  honey-thick liquid X2  F. ESOPHAGEAL PHASE: (Screening of the upper esophagus) Unable to assess secondary shoulder shadow  ASSESSMENT: This 81 year old woman; with history of CVA and dysphagia and a recent hospitalization (November 2016) for aspiration pneumonia; is presenting with  severe oropharyngeal dysphagia characterized by slow and disorganized oral management, delayed pharyngeal swallow initiation, decreased hyolaryngeal movement, decreased laryngeal vestibule closure at the height of the swallow, and incomplete epiglottic inversion There is no observed laryngeal penetration or aspiration of pureed solid.  There is silent aspiration during the swallow of honey-thick liquid. The patient is at significant risk for aspiration of consistencies thinner than applesauce.  This study supports a puree diet with pudding-thick liquid.   PLAN/RECOMMENDATIONS:   A. Diet: Puree with pudding-thick liquid   B. Swallowing Precautions: Standard   C. Recommended consultation to N/A   D. Therapy recommendations: follow-up as needed given patient's clinical presentation and results of this study   E. Results and recommendations were routed to referring MD and treating SLP         G-Codes - 10/05/15 1409    Functional Assessment Tool Used MBS, clinical judgment   Functional Limitations Swallowing   Swallow Current Status (Z6109(G8996) At least 80 percent but less than 100 percent impaired, limited or restricted   Swallow Goal Status (U0454(G8997) At least 80 percent but less than 100 percent impaired, limited or restricted   Swallow Discharge Status [phone](G8998) At least 80 percent but less than 100 percent impaired, limited or restricted          Problem List Patient Active Problem List   Diagnosis Date Noted  . Mixed bipolar I disorder (HCC) 06/03/2015  . Aspiration pneumonia of right upper lobe (HCC)   . Respiratory failure (HCC)   . HCAP (healthcare-associated pneumonia) 05/27/2015  . Aspiration of food    Dollene PrimroseSusan G Luana Tatro, MS/CCC- SLP  Leandrew KoyanagiAbernathy, Susie 10/05/2015, 2:11 PM  [facility] DIAGNOSTIC RADIOLOGY [address] [place], [place], [address] Phone: [phone]   Fax:     Name: [name] Stuart MRN: [id number] Date of Birth:  12-Jul-1934

## 2016-02-04 ENCOUNTER — Other Ambulatory Visit
Admission: RE | Admit: 2016-02-04 | Discharge: 2016-02-04 | Disposition: A | Discharge status: Home / Self Care | Payer: Medicare Other | Source: Skilled Nursing Facility | Attending: Family Medicine | Admitting: Family Medicine

## 2016-02-04 DIAGNOSIS — Z1389 Encounter for screening for other disorder: Secondary | ICD-10-CM | POA: Insufficient documentation

## 2016-02-05 LAB — LEGIONELLA PNEUMOPHILA SEROGP 1 UR AG: L. pneumophila Serogp 1 Ur Ag: NEGATIVE

## 2016-07-07 ENCOUNTER — Other Ambulatory Visit
Admission: RE | Admit: 2016-07-07 | Discharge: 2016-07-07 | Disposition: A | Discharge status: Home / Self Care | Payer: Medicare Other | Source: Ambulatory Visit | Attending: Family Medicine | Admitting: Family Medicine

## 2016-07-07 DIAGNOSIS — R509 Fever, unspecified: Secondary | ICD-10-CM | POA: Diagnosis present

## 2016-07-07 LAB — RAPID INFLUENZA A&B ANTIGENS (ARMC ONLY)
INFLUENZA A (ARMC): NEGATIVE
INFLUENZA B (ARMC): NEGATIVE

## 2016-07-28 ENCOUNTER — Other Ambulatory Visit
Admission: RE | Admit: 2016-07-28 | Discharge: 2016-07-28 | Disposition: A | Discharge status: Home / Self Care | Payer: Medicare Other | Source: Ambulatory Visit | Attending: Family Medicine | Admitting: Family Medicine

## 2016-07-28 DIAGNOSIS — J111 Influenza due to unidentified influenza virus with other respiratory manifestations: Secondary | ICD-10-CM | POA: Diagnosis present

## 2016-07-28 LAB — INFLUENZA PANEL BY PCR (TYPE A & B)
INFLAPCR: NEGATIVE
Influenza B By PCR: NEGATIVE

## 2017-03-27 ENCOUNTER — Inpatient Hospital Stay
Admission: EM | Admit: 2017-03-27 | Discharge: 2017-03-29 | DRG: 871 | Disposition: A | Discharge status: Home / Self Care | Payer: Medicare Other | Attending: Specialist | Admitting: Specialist

## 2017-03-27 ENCOUNTER — Encounter: Payer: Self-pay | Admitting: *Deleted

## 2017-03-27 ENCOUNTER — Emergency Department: Payer: Medicare Other

## 2017-03-27 DIAGNOSIS — Z880 Allergy status to penicillin: Secondary | ICD-10-CM

## 2017-03-27 DIAGNOSIS — F17210 Nicotine dependence, cigarettes, uncomplicated: Secondary | ICD-10-CM | POA: Diagnosis present

## 2017-03-27 DIAGNOSIS — Z885 Allergy status to narcotic agent status: Secondary | ICD-10-CM

## 2017-03-27 DIAGNOSIS — J189 Pneumonia, unspecified organism: Secondary | ICD-10-CM | POA: Diagnosis present

## 2017-03-27 DIAGNOSIS — J9601 Acute respiratory failure with hypoxia: Secondary | ICD-10-CM | POA: Diagnosis present

## 2017-03-27 DIAGNOSIS — Z7401 Bed confinement status: Secondary | ICD-10-CM

## 2017-03-27 DIAGNOSIS — Z8249 Family history of ischemic heart disease and other diseases of the circulatory system: Secondary | ICD-10-CM

## 2017-03-27 DIAGNOSIS — A419 Sepsis, unspecified organism: Secondary | ICD-10-CM | POA: Diagnosis not present

## 2017-03-27 DIAGNOSIS — Z7902 Long term (current) use of antithrombotics/antiplatelets: Secondary | ICD-10-CM

## 2017-03-27 DIAGNOSIS — R1312 Dysphagia, oropharyngeal phase: Secondary | ICD-10-CM | POA: Diagnosis present

## 2017-03-27 DIAGNOSIS — I69391 Dysphagia following cerebral infarction: Secondary | ICD-10-CM

## 2017-03-27 DIAGNOSIS — J449 Chronic obstructive pulmonary disease, unspecified: Secondary | ICD-10-CM | POA: Diagnosis present

## 2017-03-27 DIAGNOSIS — Z7951 Long term (current) use of inhaled steroids: Secondary | ICD-10-CM

## 2017-03-27 DIAGNOSIS — J690 Pneumonitis due to inhalation of food and vomit: Secondary | ICD-10-CM | POA: Diagnosis present

## 2017-03-27 DIAGNOSIS — F329 Major depressive disorder, single episode, unspecified: Secondary | ICD-10-CM | POA: Diagnosis present

## 2017-03-27 DIAGNOSIS — K219 Gastro-esophageal reflux disease without esophagitis: Secondary | ICD-10-CM | POA: Diagnosis present

## 2017-03-27 DIAGNOSIS — G2581 Restless legs syndrome: Secondary | ICD-10-CM | POA: Diagnosis present

## 2017-03-27 DIAGNOSIS — I10 Essential (primary) hypertension: Secondary | ICD-10-CM | POA: Diagnosis present

## 2017-03-27 DIAGNOSIS — F419 Anxiety disorder, unspecified: Secondary | ICD-10-CM | POA: Diagnosis present

## 2017-03-27 DIAGNOSIS — Z808 Family history of malignant neoplasm of other organs or systems: Secondary | ICD-10-CM

## 2017-03-27 DIAGNOSIS — R0602 Shortness of breath: Secondary | ICD-10-CM

## 2017-03-27 DIAGNOSIS — Z886 Allergy status to analgesic agent status: Secondary | ICD-10-CM

## 2017-03-27 DIAGNOSIS — Z9049 Acquired absence of other specified parts of digestive tract: Secondary | ICD-10-CM

## 2017-03-27 MED ORDER — VANCOMYCIN HCL IN DEXTROSE 1-5 GM/200ML-% IV SOLN
1000.0000 mg | Freq: Once | INTRAVENOUS | Status: AC
  Administered 2017-03-28: 1000 mg via INTRAVENOUS
  Filled 2017-03-27: qty 200

## 2017-03-27 MED ORDER — LEVOFLOXACIN IN D5W 500 MG/100ML IV SOLN
500.0000 mg | Freq: Once | INTRAVENOUS | Status: AC
  Administered 2017-03-28: 500 mg via INTRAVENOUS
  Filled 2017-03-27: qty 100

## 2017-03-27 NOTE — ED Provider Notes (Signed)
[facility] Emergency Department Provider Note   First MD Initiated Contact with Patient 03/27/17 2254     (approximate)  I have reviewed the triage vital signs and the nursing notes.   HISTORY  Chief Complaint Chest Pain   HPI [name] Stuart is a 82 y.o. female with below list of chronic medical conditionsincluding previous aspiration pneumonia presents with periodic hypoxia during the course of today. Per the patient's husband saturations drop into the 50s and 70s in doing so for as much as 30-45 minutes. Patient also admits to increasing levels of confusion. No known fever afebrile on presentation. Patient's husband states that she is breathing "heavier" than usual as well   Past Medical History:  Diagnosis Date  . Anxiety state, unspecified   . Chronic airway obstruction, not elsewhere classified   . Cognitive communication deficit   . Depressive disorder, not elsewhere classified   . Difficulty in walking(719.7)   . Dysphagia, oropharyngeal phase   . Esophageal reflux   . Insomnia, unspecified   . Muscle weakness (generalized)   . Other and unspecified hyperlipidemia   . Other chronic pain   . Other specified disorder of skin   . Other specified rehabilitation procedure(V57.89)   . Other symptoms involving skin and integumentary tissues   . Pressure ulcer, buttock(707.05)   . Restless legs syndrome (RLS)   . Stroke (HCC)   . Unspecified constipation   . Unspecified episodic mood disorder   . Unspecified essential hypertension   . Unspecified late effects of cerebrovascular disease     Patient Active Problem List   Diagnosis Date Noted  . Mixed bipolar I disorder (HCC) 06/03/2015  . Aspiration pneumonia of right upper lobe (HCC)   . Respiratory failure (HCC)   . HCAP (healthcare-associated pneumonia) 05/27/2015  . Aspiration of food     Past Surgical History:  Procedure Laterality Date  . BONY PELVIS SURGERY    . CHOLECYSTECTOMY       Prior to Admission medications   Medication Sig Start Date End Date Taking? Authorizing Provider  acetaminophen (TYLENOL) 325 MG tablet Take 650 mg by mouth every 4 (four) hours as needed for mild pain, moderate pain or fever.     [provider]  albuterol (PROVENTIL) (2.5 MG/3ML) 0.083% nebulizer solution Take 3 mLs (2.5 mg total) by nebulization 4 (four) times daily. 06/05/15   Alford Highland, MD  ALPRAZolam Prudy Feeler) 0.25 MG tablet Take 1 tablet (0.25 mg total) by mouth every 6 (six) hours as needed for anxiety. 06/05/15   Alford Highland, MD  amLODipine (NORVASC) 2.5 MG tablet Take 2.5 mg by mouth daily.    [provider]  budesonide (PULMICORT) 0.25 MG/2ML nebulizer solution Take 2 mLs (0.25 mg total) by nebulization 2 (two) times daily. 06/05/15   Alford Highland, MD  clopidogrel (PLAVIX) 75 MG tablet Take 75 mg by mouth daily.     [provider]  escitalopram (LEXAPRO) 10 MG tablet Take 10 mg by mouth daily.    [provider]  fluticasone (FLONASE) 50 MCG/ACT nasal spray Place 2 sprays into both nostrils daily.    [provider]  miconazole (MICOTIN) 2 % cream Apply 1 application topically 2 (two) times daily. To buttocks and sacrum    [provider]  nystatin (MYCOSTATIN/NYSTOP) 100000 UNIT/GM POWD Apply topically 2 (two) times daily. To groin    [provider]  OLANZapine (ZYPREXA) 2.5 MG tablet Take 2.5 mg by mouth 2 (two) times  daily.    [provider]  omeprazole (PRILOSEC) 20 MG capsule Take 20 mg by mouth every morning.     [provider]  polyethylene glycol (MIRALAX / GLYCOLAX) packet Take 17 g by mouth daily as needed for mild constipation, moderate constipation or severe constipation.     [provider]  pravastatin (PRAVACHOL) 10 MG tablet Take 10 mg by mouth at bedtime.     [provider]  rOPINIRole (REQUIP) 0.25 MG tablet Take 0.75 mg by mouth every evening.     [provider]  traMADol (ULTRAM) 50 MG tablet Take 1 tablet (50 mg total) by mouth every 6 (six) hours as needed for moderate pain or severe pain. 06/05/15   Alford Highland, MD  traZODone (DESYREL) 150 MG tablet Take 150 mg by mouth at bedtime.    [provider]  valproic acid (DEPAKENE) 250 MG capsule Take 1 capsule (250 mg total) by mouth 3 (three) times daily. 06/05/15   Alford Highland, MD  Vitamin D, Ergocalciferol, (DRISDOL) 50000 UNITS CAPS capsule Take 50,000 Units by mouth every 30 (thirty) days. On the 22nd of each month    [provider]    Allergies Aspirin; Codeine sulfate; Ibuprofen; and Penicillins  Family History  Problem Relation Age of Onset  . CAD Mother   . Brain cancer Father     Social History Social History  Substance Use Topics  . Smoking status: Current Some Day Smoker    Packs/day: 0.25  . Smokeless tobacco: Never Used  . Alcohol use No    Review of Systems Constitutional: No fever/chills Eyes: No visual changes. ENT: No sore throat. Cardiovascular: Denies chest pain. Respiratory: Denies shortness of breath. Gastrointestinal: No abdominal pain.  No nausea, no vomiting.  No diarrhea.  No constipation. Genitourinary: Negative for dysuria. Musculoskeletal: Negative for neck pain.  Negative for back pain. Integumentary: Negative for rash. Neurological: Negative for headaches, focal weakness or numbness.   ____________________________________________   PHYSICAL EXAM:  VITAL SIGNS: ED Triage Vitals  Enc Vitals Group     BP 03/27/17 2247 135/63     Pulse Rate 03/27/17 2247 97     Resp 03/27/17 2247 18     Temp 03/27/17 2247 98.2 F (36.8 C)     Temp Source 03/27/17 2247 Oral     SpO2 03/27/17 2240 95 %     Weight 03/27/17 2248 83 kg (183 lb)     Height --      Head Circumference --      [facility] --      Pain Score --      Pain Loc --      Pain Edu? --      Excl. in GC? --     Constitutional: Alert and  oriented. Well appearing and in no acute distress. Eyes: Conjunctivae are normal.  Head: Atraumatic. Mouth/Throat: Mucous membranes are moist.  Oropharynx non-erythematous. Neck: No stridor.   Cardiovascular: Normal rate, regular rhythm. Good peripheral circulation. Grossly normal heart sounds. Respiratory: tachypnea  No retractions. bibasilar Gastrointestinal: Soft and nontender. No distention.  Musculoskeletal: No lower extremity tenderness nor edema. No gross deformities of extremities. Neurologic:  Normal speech and language. No gross focal neurologic deficits are appreciated.  Skin:  Skin is warm, dry and intact. No rash noted. Psychiatric: Mood and affect are normal. Speech and behavior are normal.  ____________________________________________   LABS (all labs ordered are listed, but only abnormal results are displayed)  Labs Reviewed  CULTURE, BLOOD (ROUTINE X 2)  CULTURE, BLOOD (ROUTINE X 2)  BASIC METABOLIC PANEL  CBC  TROPONIN I   ____________________________________________  EKG  ED ECG REPORT I, [place] N Abdirizak Richison, the attending physician, personally viewed and interpreted this ECG.   Date: 03/27/2017  EKG Time: 10:34 PM  Rate: 120  Rhythm:sinus tachycardia right bundle branch block Axis: normal  Intervals:normal  ST&T Change: none  ____________________________________________  RADIOLOGY I, Phillips Dewayne Shorter, personally viewed and evaluated these images (plain radiographs) as part of my medical decision making, as well as reviewing the written report by the radiologist.  Dg Chest Port 1 View  Result Date: 03/27/2017 CLINICAL DATA:  Rib pain shortness of breath EXAM: PORTABLE CHEST 1 VIEW COMPARISON:  06/03/2015, CT chest 05/27/2015, 05/31/2015 FINDINGS: Linear scarring or atelectasis at the left lower lung. Low lung volumes. Bibasilar opacities, could reflect infiltrates. Left infrahilar opacity is somewhat stellate in configuration. Normal heart size. Aortic  atherosclerosis. No pneumothorax. IMPRESSION: Low lung volumes. Bilateral infrahilar opacities could reflect infiltrates. Radiographic follow-up to resolution is recommended. Electronically Signed   By: Jasmine Pang M.D.   On: 03/27/2017 22:55    _______________ Procedures   ____________________________________________   INITIAL IMPRESSION / ASSESSMENT AND PLAN / ED COURSE  Pertinent labs & imaging results that were available during my care of the patient were reviewed by me and considered in my medical decision making (see chart for details).   83 year old female presenting above stated history of physical exam with concern for the possible differential diagnoses: Aspiration pneumonia healthcare associated pneumonia Pulmonary emboli  Chest x-ray finding concerning for pneumonia considered possibility of this being an aspiration pneumonia or healthcare associated. Patient given IV vancomycin and Levaquin secondary to penicillin allergy. Patient discussed with Dr. Anne Hahn for hospital admission for further evaluation and management.     ____________________________________________  FINAL CLINICAL IMPRESSION(S) / ED DIAGNOSES  Final diagnoses:  Aspiration pneumonia of both lower lobes, unspecified aspiration pneumonia type (HCC)     MEDICATIONS GIVEN DURING THIS VISIT:  Medications  vancomycin (VANCOCIN) IVPB 1000 mg/200 mL premix (not administered)  levofloxacin (LEVAQUIN) IVPB 500 mg (not administered)     NEW OUTPATIENT MEDICATIONS STARTED DURING THIS VISIT:  New Prescriptions   No medications on file    Modified Medications   No medications on file    Discontinued Medications   No medications on file     Note:  This document was prepared using Dragon voice recognition software and may include unintentional dictation errors.    Darci Current, MD 03/27/17 [phone]

## 2017-03-27 NOTE — ED Triage Notes (Signed)
PT to ED from [facility] after family and [facility] report pts oxygen saturation dropped to 59% on 3L. Family reports oxygen returned to 94% soon after they dropped but family was worried about oxygen saturation and requested evaluation. EMS reports oxygen saturation of 95% on 3L. PT denied SOB or increased WOB. Left sided chest and rib pain noted upon palpation of pts ribs. Pt has intermittent negative verbalizations throughout assessment but when asked what is hurting pt is unable to answer and reports "I feel fine." [facility] RN verbalized this is normal for pt. (hx of dementia).   Family also reports they asked PCP to evaluate pt for a UTI today due to increased fatigue. [facility] verified that urinalysis was never performed.

## 2017-03-27 NOTE — ED Notes (Signed)
Family at bedside. Report given to Aram Beecham, [place]

## 2017-03-28 DIAGNOSIS — I10 Essential (primary) hypertension: Secondary | ICD-10-CM | POA: Diagnosis present

## 2017-03-28 DIAGNOSIS — Z7401 Bed confinement status: Secondary | ICD-10-CM | POA: Diagnosis not present

## 2017-03-28 DIAGNOSIS — Z885 Allergy status to narcotic agent status: Secondary | ICD-10-CM | POA: Diagnosis not present

## 2017-03-28 DIAGNOSIS — Z8249 Family history of ischemic heart disease and other diseases of the circulatory system: Secondary | ICD-10-CM | POA: Diagnosis not present

## 2017-03-28 DIAGNOSIS — Z7902 Long term (current) use of antithrombotics/antiplatelets: Secondary | ICD-10-CM | POA: Diagnosis not present

## 2017-03-28 DIAGNOSIS — A419 Sepsis, unspecified organism: Secondary | ICD-10-CM | POA: Diagnosis present

## 2017-03-28 DIAGNOSIS — G2581 Restless legs syndrome: Secondary | ICD-10-CM | POA: Diagnosis present

## 2017-03-28 DIAGNOSIS — Z886 Allergy status to analgesic agent status: Secondary | ICD-10-CM | POA: Diagnosis not present

## 2017-03-28 DIAGNOSIS — Z7951 Long term (current) use of inhaled steroids: Secondary | ICD-10-CM | POA: Diagnosis not present

## 2017-03-28 DIAGNOSIS — J449 Chronic obstructive pulmonary disease, unspecified: Secondary | ICD-10-CM | POA: Diagnosis present

## 2017-03-28 DIAGNOSIS — Z880 Allergy status to penicillin: Secondary | ICD-10-CM | POA: Diagnosis not present

## 2017-03-28 DIAGNOSIS — K219 Gastro-esophageal reflux disease without esophagitis: Secondary | ICD-10-CM | POA: Diagnosis present

## 2017-03-28 DIAGNOSIS — R0602 Shortness of breath: Secondary | ICD-10-CM | POA: Diagnosis present

## 2017-03-28 DIAGNOSIS — I69391 Dysphagia following cerebral infarction: Secondary | ICD-10-CM | POA: Diagnosis not present

## 2017-03-28 DIAGNOSIS — Z9049 Acquired absence of other specified parts of digestive tract: Secondary | ICD-10-CM | POA: Diagnosis not present

## 2017-03-28 DIAGNOSIS — F329 Major depressive disorder, single episode, unspecified: Secondary | ICD-10-CM | POA: Diagnosis present

## 2017-03-28 DIAGNOSIS — F419 Anxiety disorder, unspecified: Secondary | ICD-10-CM | POA: Diagnosis present

## 2017-03-28 DIAGNOSIS — J690 Pneumonitis due to inhalation of food and vomit: Secondary | ICD-10-CM | POA: Diagnosis present

## 2017-03-28 DIAGNOSIS — J9601 Acute respiratory failure with hypoxia: Secondary | ICD-10-CM | POA: Diagnosis present

## 2017-03-28 DIAGNOSIS — F17210 Nicotine dependence, cigarettes, uncomplicated: Secondary | ICD-10-CM | POA: Diagnosis present

## 2017-03-28 DIAGNOSIS — Z808 Family history of malignant neoplasm of other organs or systems: Secondary | ICD-10-CM | POA: Diagnosis not present

## 2017-03-28 DIAGNOSIS — R1312 Dysphagia, oropharyngeal phase: Secondary | ICD-10-CM | POA: Diagnosis present

## 2017-03-28 LAB — MRSA PCR SCREENING: MRSA by PCR: NEGATIVE

## 2017-03-28 LAB — BASIC METABOLIC PANEL
Anion gap: 13 (ref 5–15)
BUN: 19 mg/dL (ref 6–20)
CHLORIDE: 100 mmol/L — AB (ref 101–111)
CO2: 27 mmol/L (ref 22–32)
Calcium: 9 mg/dL (ref 8.9–10.3)
Creatinine, Ser: 1 mg/dL (ref 0.44–1.00)
GFR calc non Af Amer: 52 mL/min — ABNORMAL LOW (ref >60)
Glucose, Bld: 135 mg/dL — ABNORMAL HIGH (ref 65–99)
Potassium: 5.3 mmol/L — ABNORMAL HIGH (ref 3.5–5.1)
SODIUM: 140 mmol/L (ref 135–145)

## 2017-03-28 LAB — CBC
HCT: 38.1 % (ref 35.0–47.0)
Hemoglobin: 12.7 g/dL (ref 12.0–16.0)
MCH: 30.9 pg (ref 26.0–34.0)
MCHC: 33.4 g/dL (ref 32.0–36.0)
MCV: 92.5 fL (ref 80.0–100.0)
Platelets: 190 10*3/uL (ref 150–440)
RBC: 4.12 MIL/uL (ref 3.80–5.20)
RDW: 13.3 % (ref 11.5–14.5)
WBC: 17.4 10*3/uL — AB (ref 3.6–11.0)

## 2017-03-28 LAB — HEMOGLOBIN A1C
Hgb A1c MFr Bld: 4.8 % (ref 4.8–5.6)
Mean Plasma Glucose: 91.06 mg/dL

## 2017-03-28 LAB — TROPONIN I

## 2017-03-28 LAB — TSH: TSH: 1.607 u[IU]/mL (ref 0.350–4.500)

## 2017-03-28 MED ORDER — ALBUTEROL SULFATE (2.5 MG/3ML) 0.083% IN NEBU
INHALATION_SOLUTION | RESPIRATORY_TRACT | Status: AC
  Filled 2017-03-28: qty 3

## 2017-03-28 MED ORDER — AMLODIPINE BESYLATE 5 MG PO TABS
2.5000 mg | ORAL_TABLET | Freq: Every day | ORAL | Status: DC
  Administered 2017-03-28 – 2017-03-29 (×2): 2.5 mg via ORAL
  Filled 2017-03-28 (×2): qty 1

## 2017-03-28 MED ORDER — DOCUSATE SODIUM 100 MG PO CAPS
100.0000 mg | ORAL_CAPSULE | Freq: Two times a day (BID) | ORAL | Status: DC
  Filled 2017-03-28 (×2): qty 1

## 2017-03-28 MED ORDER — SODIUM CHLORIDE 0.9 % IV SOLN
INTRAVENOUS | Status: DC
  Administered 2017-03-28: 05:00:00 via INTRAVENOUS

## 2017-03-28 MED ORDER — CLONAZEPAM 0.125 MG PO TBDP
0.2500 mg | ORAL_TABLET | Freq: Every day | ORAL | Status: DC
  Administered 2017-03-28 – 2017-03-29 (×2): 0.25 mg via ORAL
  Filled 2017-03-28: qty 2

## 2017-03-28 MED ORDER — BUDESONIDE 0.25 MG/2ML IN SUSP
0.2500 mg | Freq: Two times a day (BID) | RESPIRATORY_TRACT | Status: DC
  Administered 2017-03-28 (×2): 0.25 mg via RESPIRATORY_TRACT
  Filled 2017-03-28 (×3): qty 2

## 2017-03-28 MED ORDER — FLUTICASONE PROPIONATE 50 MCG/ACT NA SUSP
2.0000 | Freq: Every day | NASAL | Status: DC
  Administered 2017-03-28 – 2017-03-29 (×2): 2 via NASAL
  Filled 2017-03-28: qty 16

## 2017-03-28 MED ORDER — ACETAMINOPHEN 325 MG PO TABS: 650.0000 mg | ORAL_TABLET | Freq: Four times a day (QID) | ORAL | Status: DC | PRN

## 2017-03-28 MED ORDER — CLOPIDOGREL BISULFATE 75 MG PO TABS
75.0000 mg | ORAL_TABLET | Freq: Every day | ORAL | Status: DC
  Administered 2017-03-28 – 2017-03-29 (×2): 75 mg via ORAL
  Filled 2017-03-28 (×2): qty 1

## 2017-03-28 MED ORDER — GUAIFENESIN-DM 100-10 MG/5ML PO SYRP
10.0000 mL | ORAL_SOLUTION | ORAL | Status: DC | PRN
  Administered 2017-03-28: 10 mL via ORAL
  Filled 2017-03-28: qty 10

## 2017-03-28 MED ORDER — SENNOSIDES-DOCUSATE SODIUM 8.6-50 MG PO TABS
1.0000 | ORAL_TABLET | Freq: Two times a day (BID) | ORAL | Status: DC
  Administered 2017-03-28 – 2017-03-29 (×3): 1 via ORAL
  Filled 2017-03-28 (×3): qty 1

## 2017-03-28 MED ORDER — ALBUTEROL SULFATE (2.5 MG/3ML) 0.083% IN NEBU
2.5000 mg | INHALATION_SOLUTION | Freq: Two times a day (BID) | RESPIRATORY_TRACT | Status: DC
  Administered 2017-03-28: 2.5 mg via RESPIRATORY_TRACT
  Filled 2017-03-28 (×2): qty 3

## 2017-03-28 MED ORDER — ROPINIROLE HCL 0.5 MG PO TABS
0.7500 mg | ORAL_TABLET | Freq: Every evening | ORAL | Status: DC
  Filled 2017-03-28: qty 1

## 2017-03-28 MED ORDER — OLOPATADINE HCL 0.1 % OP SOLN
1.0000 [drp] | Freq: Every day | OPHTHALMIC | Status: DC
  Administered 2017-03-28 – 2017-03-29 (×2): 1 [drp] via OPHTHALMIC
  Filled 2017-03-28: qty 5

## 2017-03-28 MED ORDER — AZTREONAM 2 G IJ SOLR: 2.0000 g | Freq: Three times a day (TID) | INTRAMUSCULAR | Status: DC

## 2017-03-28 MED ORDER — ROPINIROLE HCL 0.25 MG PO TABS
0.7500 mg | ORAL_TABLET | Freq: Every evening | ORAL | Status: DC
  Administered 2017-03-28: 0.75 mg via ORAL
  Filled 2017-03-28 (×2): qty 3

## 2017-03-28 MED ORDER — SODIUM CHLORIDE 0.9 % IV SOLN
1.0000 g | Freq: Two times a day (BID) | INTRAVENOUS | Status: DC
  Administered 2017-03-28 (×2): 1 g via INTRAVENOUS
  Filled 2017-03-28 (×5): qty 1

## 2017-03-28 MED ORDER — TRAZODONE HCL 50 MG PO TABS
150.0000 mg | ORAL_TABLET | Freq: Every day | ORAL | Status: DC
  Administered 2017-03-28: 22:00:00 150 mg via ORAL
  Filled 2017-03-28: qty 1

## 2017-03-28 MED ORDER — ESCITALOPRAM OXALATE 10 MG PO TABS
10.0000 mg | ORAL_TABLET | Freq: Every day | ORAL | Status: DC
  Administered 2017-03-28 – 2017-03-29 (×2): 10 mg via ORAL
  Filled 2017-03-28 (×2): qty 1

## 2017-03-28 MED ORDER — BUDESONIDE 0.25 MG/2ML IN SUSP: 0.2500 mg | Freq: Two times a day (BID) | RESPIRATORY_TRACT | Status: DC

## 2017-03-28 MED ORDER — MELATONIN 5 MG PO TABS
5.0000 mg | ORAL_TABLET | Freq: Every day | ORAL | Status: DC
  Administered 2017-03-28: 5 mg via ORAL
  Filled 2017-03-28 (×2): qty 1

## 2017-03-28 MED ORDER — CRANBERRY 425 MG PO CAPS: 1.0000 | ORAL_CAPSULE | Freq: Every day | ORAL | Status: DC

## 2017-03-28 MED ORDER — FOLIC ACID 1 MG PO TABS
1000.0000 ug | ORAL_TABLET | Freq: Every day | ORAL | Status: DC
  Administered 2017-03-28 – 2017-03-29 (×2): 1 mg via ORAL
  Filled 2017-03-28 (×2): qty 1

## 2017-03-28 MED ORDER — VANCOMYCIN HCL 10 G IV SOLR
1250.0000 mg | INTRAVENOUS | Status: DC
  Administered 2017-03-28: 1250 mg via INTRAVENOUS
  Filled 2017-03-28: qty 1250

## 2017-03-28 MED ORDER — ONDANSETRON HCL 4 MG PO TABS: 4.0000 mg | ORAL_TABLET | Freq: Four times a day (QID) | ORAL | Status: DC | PRN

## 2017-03-28 MED ORDER — ENOXAPARIN SODIUM 40 MG/0.4ML ~~LOC~~ SOLN
40.0000 mg | SUBCUTANEOUS | Status: DC
  Administered 2017-03-28 – 2017-03-29 (×2): 40 mg via SUBCUTANEOUS
  Filled 2017-03-28 (×2): qty 0.4

## 2017-03-28 MED ORDER — TRAMADOL HCL 50 MG PO TABS: 50.0000 mg | ORAL_TABLET | Freq: Four times a day (QID) | ORAL | Status: DC | PRN

## 2017-03-28 MED ORDER — ALBUTEROL SULFATE (2.5 MG/3ML) 0.083% IN NEBU
2.5000 mg | INHALATION_SOLUTION | RESPIRATORY_TRACT | Status: DC
  Administered 2017-03-28 (×2): 2.5 mg via RESPIRATORY_TRACT
  Filled 2017-03-28: qty 3

## 2017-03-28 MED ORDER — PANTOPRAZOLE SODIUM 40 MG PO TBEC
40.0000 mg | DELAYED_RELEASE_TABLET | Freq: Every day | ORAL | Status: DC
  Administered 2017-03-28 – 2017-03-29 (×2): 40 mg via ORAL
  Filled 2017-03-28 (×2): qty 1

## 2017-03-28 MED ORDER — ONDANSETRON HCL 4 MG/2ML IJ SOLN: 4.0000 mg | Freq: Four times a day (QID) | INTRAMUSCULAR | Status: DC | PRN

## 2017-03-28 MED ORDER — ACETAMINOPHEN 650 MG RE SUPP: 650.0000 mg | Freq: Four times a day (QID) | RECTAL | Status: DC | PRN

## 2017-03-28 MED ORDER — DIVALPROEX SODIUM 125 MG PO CSDR
250.0000 mg | DELAYED_RELEASE_CAPSULE | Freq: Three times a day (TID) | ORAL | Status: DC
  Administered 2017-03-28 – 2017-03-29 (×5): 250 mg via ORAL
  Filled 2017-03-28 (×7): qty 2

## 2017-03-28 NOTE — Progress Notes (Signed)
[facility] [place] [facility]   PATIENT NAME: [name] Stuart    MR#:  [id number]  DATE OF BIRTH:  [birth year]  SUBJECTIVE:   Patient is here due to acute respiratory failure with hypoxia secondary to suspected aspiration pneumonia. Patient's daughter is at bedside. She does not appear to be short of breath presently. Seen by speech therapy and placed back on her period diet with pudding thick liquids.  REVIEW OF SYSTEMS:    Review of Systems  Constitutional: Negative for chills and fever.  HENT: Negative for congestion and tinnitus.   Eyes: Negative for blurred vision and double vision.  Respiratory: Negative for cough, shortness of breath and wheezing.   Cardiovascular: Negative for chest pain, orthopnea and PND.  Gastrointestinal: Negative for abdominal pain, diarrhea, nausea and vomiting.  Genitourinary: Negative for dysuria and hematuria.  Neurological: Negative for dizziness, sensory change and focal weakness.  All other systems reviewed and are negative.   Nutrition: Pured diet with pudding thick liquids. Tolerating Diet: Yes Tolerating PT: Patient is bedbound     DRUG ALLERGIES:   Allergies  Allergen Reactions  . Aspirin   . Codeine Sulfate   . Ibuprofen   . Penicillins     VITALS:  Blood pressure (!) 117/55, pulse 73, temperature 98.2 F (36.8 C), temperature source Oral, resp. rate 20, height  (1.702 m), weight 72.9 kg (160 lb 11.2 oz), SpO2 96 %.  PHYSICAL EXAMINATION:   Physical Exam  GENERAL:  82 y.o.-year-old patient lying in bed in no acute distress.  EYES: Pupils equal, round, reactive to light and accommodation. No scleral icterus. Extraocular muscles intact.  HEENT: Head atraumatic, normocephalic. Oropharynx and nasopharynx clear.  NECK:  Supple, no jugular venous distention. No thyroid enlargement, no tenderness.  LUNGS: Normal breath sounds bilaterally, no wheezing, rales, rhonchi. No use of accessory muscles of  respiration.  CARDIOVASCULAR: S1, S2 normal. No murmurs, rubs, or gallops.  ABDOMEN: Soft, nontender, nondistended. Bowel sounds present. No organomegaly or mass.  EXTREMITIES: No cyanosis, clubbing, +1-2 edema b/l (dependent edema due to poor mobility) NEUROLOGIC: Cranial nerves II through XII are intact. No focal Motor or sensory deficits b/l. Globally weak.   PSYCHIATRIC: The patient is alert and oriented x 3.  SKIN: No obvious rash, lesion, or ulcer.    LABORATORY PANEL:   CBC  Recent Labs Lab 03/27/17 2332  WBC 17.4*  HGB 12.7  HCT 38.1  PLT 190   ------------------------------------------------------------------------------------------------------------------  Chemistries   Recent Labs Lab 03/27/17 2332  NA 140  K 5.3*  CL 100*  CO2 27  GLUCOSE 135*  BUN 19  CREATININE 1.00  CALCIUM 9.0   ------------------------------------------------------------------------------------------------------------------  Cardiac Enzymes  Recent Labs Lab 03/27/17 2332  TROPONINI <0.03   ------------------------------------------------------------------------------------------------------------------  RADIOLOGY:  Dg Chest Port 1 View  Result Date: 03/27/2017 CLINICAL DATA:  Rib pain shortness of breath EXAM: PORTABLE CHEST 1 VIEW COMPARISON:  06/03/2015, CT chest 05/27/2015, 05/31/2015 FINDINGS: Linear scarring or atelectasis at the left lower lung. Low lung volumes. Bibasilar opacities, could reflect infiltrates. Left infrahilar opacity is somewhat stellate in configuration. Normal heart size. Aortic atherosclerosis. No pneumothorax. IMPRESSION: Low lung volumes. Bilateral infrahilar opacities could reflect infiltrates. Radiographic follow-up to resolution is recommended. Electronically Signed   By: Jasmine Pang M.D.   On: 03/27/2017 22:55     ASSESSMENT AND PLAN:   82 year old female with past medical history of previous CVA, restless leg syndrome, chronic dysphagia,  COPD, anxiety/depression, who presents to the  hospital due to acute respiratory failure with hypoxia.  1. Acute respiratory failure with hypoxia-suspected be secondary to aspiration pneumonia.  -continue O2 supplementation. Continue IV antibiotics with Meropenem for the pneumonia. Wean oxygen as tolerated.  2. Pneumonia-suspect to be aspiration pneumonia - We'll DC IV vancomycin, continue IV Meropenem. Seen by speech therapy and placed on. Pureed Diet with pudding thick liquids. -Currently afebrile and hemodynamically stable and not hypoxic.  3. Essential hypertension-continue Norvasc.  4. History of previous CVA-continue Plavix.  5. Anxiety/depression-continue Klonopin, Lexapro.  6. Restless leg syndrome-continue Requip.  7. GERD - cont. Protonix.    All the records are reviewed and case discussed with Care Management/Social Worker. Management plans discussed with the patient, family and they are in agreement.  CODE STATUS: Full code  DVT Prophylaxis: Lovenox  TOTAL TIME TAKING CARE OF THIS PATIENT: 30 minutes.   POSSIBLE D/C IN 1-2 DAYS, DEPENDING ON CLINICAL CONDITION.   Houston Siren M.D on 03/28/2017 at 1:43 PM  Between 7am to 6pm - Pager - [phone]  After 6pm go to [email or web address] - Therapist, nutritional Hospitalists  Office  [phone]  CC: Primary care physician; [place], [facility]

## 2017-03-28 NOTE — Evaluation (Signed)
Clinical/Bedside Swallow Evaluation Patient Details  Name: [name] Stuart MRN: [id number] Date of Birth: June 15, 1935  Today's Date: 03/28/2017 Time: SLP Start Time (ACUTE ONLY): 1100 SLP Stop Time (ACUTE ONLY): 1200 SLP Time Calculation (min) (ACUTE ONLY): 60 min  Past Medical History:  Past Medical History:  Diagnosis Date  . Anxiety state, unspecified   . Chronic airway obstruction, not elsewhere classified   . Cognitive communication deficit   . Depressive disorder, not elsewhere classified   . Difficulty in walking(719.7)   . Dysphagia, oropharyngeal phase   . Esophageal reflux   . Insomnia, unspecified   . Muscle weakness (generalized)   . Other and unspecified hyperlipidemia   . Other chronic pain   . Other specified disorder of skin   . Other specified rehabilitation procedure(V57.89)   . Other symptoms involving skin and integumentary tissues   . Pressure ulcer, buttock(707.05)   . Restless legs syndrome (RLS)   . Stroke (HCC)   . Unspecified constipation   . Unspecified episodic mood disorder   . Unspecified essential hypertension   . Unspecified late effects of cerebrovascular disease    Past Surgical History:  Past Surgical History:  Procedure Laterality Date  . BONY PELVIS SURGERY    . CHOLECYSTECTOMY     HPI:  Patient is a 82 y/o female with past medical history of stroke, Left sided weakness, anxiety, oropharyngeal phase dysphagia, GERD, and multiple other medical issues who presents to the ED with hypoxia. Her family noticed that her pulse oximetry read 50% at her nursing home. The patient admits to being somewhat short of breath. The is a report of chest pain but the patient denies chest pain at any time. CXR revealed bilateral lower lobe opacities. Sepsis protocol was initiated and the hospitalist service contacted for admission. Pt has been on a Dysphagia level 1 diet w/ Honey consistency liquids per family but recommended to be on Pudding thick liquids post  MBSS in 2017(silent laryngeal penetration and aspiration noted during that exam). Pt does require full assistance at meals.    Assessment / Plan / Recommendation Clinical Impression  Pt presents w/ a significant h/o oropharyngeal phase dysphagia (post multiple MBSSs) secondary to CVA w/ left sided weakness. Pt has been following a dysphagia diet per family but not the exact liquid consistency consistently. Due to pt's baseline dysphagia and MBSS results and recommendation previously, pt was only assessed w/ puree during this exam at bedside(did not have pudding consistency liquids). pt consumed tsp trials w/ SLP and w/ NSG(crushed meds) w/ min oral phase deficits c/b min left lingual residue and min decreased lingual coordination during the bolus management; given time she cleared w/ f/u swallow adequately. However, during the pharyngeal phase, pt exhibited inconsistent, mild coughing b/t trials, not immediate to the swallow. Unsure if related to any pharyngeal residue possibly remaining or to the delayed pharyngeal swallow initiation(as noted during previous MBSS). Education given on strict aspiration precautions, rest breaks b/t trials. As tsp trials continued, the coughing did not increased in intensity or frequency. Feeding assistance given. Consulted MD post evaluation re: pt's h/o dysphagia and presentation today at [facility]. MD agreed w/ recommended order of Dysphagia level 1(puree) w/ PUDDING thick liquids; strict aspiration precautions; pills crushed in puree. ST services will f/u w/ toleration of diet; monitoring of pulmonary status and need for objective assessment as pt's medical and respiratory status' continue to improve - only want to repeat a MBSS when pt is at her best health. Family and NSG  updated.  SLP Visit Diagnosis: Dysphagia, oropharyngeal phase (R13.12)    Aspiration Risk  Severe aspiration risk;Risk for inadequate nutrition/hydration    Diet Recommendation  Dysphagia level 1 (puree) w/  PUDDING thick liquids; strict aspiration precautions; Reflux precautions; feeding assistance at all meals - reduce distractions at meals  Medication Administration: Crushed with puree    Other  Recommendations Recommended Consults:  (Dietician f/u; Palliative Care to be considered for goals) Oral Care Recommendations: Oral care BID;Staff/trained caregiver to provide oral care Other Recommendations: Order thickener from pharmacy;Prohibited food (jello, ice cream, thin soups);Remove water pitcher;Have oral suction available   Follow up Recommendations Skilled Nursing facility (education)      Frequency and Duration min 2x/week  1 week       Prognosis Prognosis for Safe Diet Advancement: Guarded Barriers to Reach Goals: Cognitive deficits;Severity of deficits;Time post onset Barriers/Prognosis Comment: baseline pharyngeal phase dysphagia; oral phase dysphagia      Swallow Study   General Date of Onset: 03/27/17 HPI: Patient is a 82 y/o female with past medical history of stroke, Left sided weakness, anxiety, oropharyngeal phase dysphagia, GERD, and multiple other medical issues who presents to the ED with hypoxia. Her family noticed that her pulse oximetry read 50% at her nursing home. The patient admits to being somewhat short of breath. The is a report of chest pain but the patient denies chest pain at any time. CXR revealed bilateral lower lobe opacities. Sepsis protocol was initiated and the hospitalist service contacted for admission. Pt has been on a Dysphagia level 1 diet w/ Honey consistency liquids per family but recommended to be on Pudding thick liquids post MBSS in 2017(silent laryngeal penetration and aspiration noted during that exam). Pt does require full assistance at meals.  Type of Study: Bedside Swallow Evaluation Previous Swallow Assessment: MBSS x2(2016, 2017) Diet Prior to this Study: Dysphagia 1 (puree);Honey-thick liquids;Pudding-thick liquids (per family  report) Temperature Spikes Noted: No (wbc 17.4) Respiratory Status: Nasal cannula (3 liters) History of Recent Intubation: No Behavior/Cognition: Alert;Cooperative;Pleasant mood;Confused;Distractible;Requires cueing (min) Oral Cavity Assessment: Dry (sticky) Oral Care Completed by SLP: Recent completion by staff Oral Cavity - Dentition: Dentures, top (no bottom denture plate - too loose per fmaily) Vision:  (n/a) Self-Feeding Abilities: Total assist Patient Positioning: Upright in bed;Postural control adequate for testing (tends to lean) Baseline Vocal Quality: Normal Volitional Cough: Strong Volitional Swallow: Able to elicit    Oral/Motor/Sensory Function Overall Oral Motor/Sensory Function: Mild impairment (-Moderate) Facial ROM: Reduced left Facial Symmetry: Abnormal symmetry left Facial Strength: Reduced left Facial Sensation: Reduced left Lingual ROM: Reduced left Lingual Symmetry: Abnormal symmetry left Lingual Strength: Reduced Lingual Sensation: Reduced Velum: Within Functional Limits Mandible: Within Functional Limits   Ice Chips Ice chips: Not tested   Thin Liquid Thin Liquid: Not tested    Nectar Thick Nectar Thick Liquid: Not tested   Honey Thick Honey Thick Liquid: Not tested   Puree Puree: Impaired Presentation: Spoon (fed; 10 trials+) Oral Phase Impairments: Reduced lingual movement/coordination;Poor awareness of bolus Oral Phase Functional Implications: Oral residue (left lingual residue - min) Pharyngeal Phase Impairments: Cough - Delayed (intermittently) Other Comments: pt was easily distracted and often talked immediately following po trials - given education on reducing talking during ALL/ANY oral intake   Solid   GO   Solid: Not tested    Functional Assessment Tool Used: clinical judgement Functional Limitations: Swallowing Swallow Current Status (V7846): At least 60 percent but less than 80 percent impaired, limited or restricted Swallow Goal  Status [phone]): At least 60 percent but less than 80 percent impaired, limited or restricted Swallow Discharge Status [phone]): At least 60 percent but less than 80 percent impaired, limited or restricted    [name], MS, [name],[name] 03/28/2017,2:33 PM

## 2017-03-28 NOTE — Plan of Care (Signed)
Problem: SLP Dysphagia Goals Goal: Misc Dysphagia Goal Pt will safely tolerate po diet of least restrictive consistency w/ no overt s/s of aspiration noted by Staff/pt/family x2-3 sessions.    

## 2017-03-28 NOTE — Evaluation (Signed)
Clinical/Bedside Swallow Evaluation Patient Details  Name: [name] Stuart MRN: [id number] Date of Birth: 1935/06/27  Today's Date: 03/28/2017 Time: SLP Start Time (ACUTE ONLY): 1100 SLP Stop Time (ACUTE ONLY): 1200 SLP Time Calculation (min) (ACUTE ONLY): 60 min  Past Medical History:  Past Medical History:  Diagnosis Date  . Anxiety state, unspecified   . Chronic airway obstruction, not elsewhere classified   . Cognitive communication deficit   . Depressive disorder, not elsewhere classified   . Difficulty in walking(719.7)   . Dysphagia, oropharyngeal phase   . Esophageal reflux   . Insomnia, unspecified   . Muscle weakness (generalized)   . Other and unspecified hyperlipidemia   . Other chronic pain   . Other specified disorder of skin   . Other specified rehabilitation procedure(V57.89)   . Other symptoms involving skin and integumentary tissues   . Pressure ulcer, buttock(707.05)   . Restless legs syndrome (RLS)   . Stroke (HCC)   . Unspecified constipation   . Unspecified episodic mood disorder   . Unspecified essential hypertension   . Unspecified late effects of cerebrovascular disease    Past Surgical History:  Past Surgical History:  Procedure Laterality Date  . BONY PELVIS SURGERY    . CHOLECYSTECTOMY     HPI:      Assessment / Plan / Recommendation Clinical Impression    SLP Visit Diagnosis: Dysphagia, oropharyngeal phase (R13.12)    Aspiration Risk  Severe aspiration risk;Risk for inadequate nutrition/hydration    Diet Recommendation  Dysphagia level 1 (puree); PUDDING thick liquids. Strict aspiration precautions; feeding support; monitoring for s/s of aspiration  Medication Administration: Crushed with puree    Other  Recommendations Recommended Consults:  (Dietician f/u; Palliative Care to be considered for goals) Oral Care Recommendations: Oral care BID;Staff/trained caregiver to provide oral care Other Recommendations: Order thickener from  pharmacy;Prohibited food (jello, ice cream, thin soups);Remove water pitcher;Have oral suction available   Follow up Recommendations Skilled Nursing facility (education)      Frequency and Duration min 2x/week  1 week       Prognosis Prognosis for Safe Diet Advancement: Guarded Barriers to Reach Goals: Cognitive deficits;Severity of deficits;Time post onset Barriers/Prognosis Comment: baseline pharyngeal phase dysphagia; oral phase dysphagia      Swallow Study   General Date of Onset: 03/27/17 Type of Study: Bedside Swallow Evaluation Previous Swallow Assessment: MBSS x2(2016, 2017) Diet Prior to this Study: Dysphagia 1 (puree);Honey-thick liquids;Pudding-thick liquids (per family report) Temperature Spikes Noted: No (wbc 17.4) Respiratory Status: Nasal cannula (3 liters) History of Recent Intubation: No Behavior/Cognition: Alert;Cooperative;Pleasant mood;Confused;Distractible;Requires cueing (min) Oral Cavity Assessment: Dry (sticky) Oral Care Completed by SLP: Recent completion by staff Oral Cavity - Dentition: Dentures, top (no bottom denture plate - too loose per fmaily) Vision:  (n/a) Self-Feeding Abilities: Total assist Patient Positioning: Upright in bed;Postural control adequate for testing (tends to lean) Baseline Vocal Quality: Normal Volitional Cough: Strong Volitional Swallow: Able to elicit    Oral/Motor/Sensory Function Overall Oral Motor/Sensory Function: Mild impairment (-Moderate) Facial ROM: Reduced left Facial Symmetry: Abnormal symmetry left Facial Strength: Reduced left Facial Sensation: Reduced left Lingual ROM: Reduced left Lingual Symmetry: Abnormal symmetry left Lingual Strength: Reduced Lingual Sensation: Reduced Velum: Within Functional Limits Mandible: Within Functional Limits   Ice Chips Ice chips: Not tested   Thin Liquid Thin Liquid: Not tested    Nectar Thick Nectar Thick Liquid: Not tested   Honey Thick Honey Thick Liquid: Not tested    Puree Puree: Impaired Presentation: Spoon (  fed; 10 trials+) Oral Phase Impairments: Reduced lingual movement/coordination;Poor awareness of bolus Oral Phase Functional Implications: Oral residue (left lingual residue - min) Pharyngeal Phase Impairments: Cough - Delayed (intermittently) Other Comments: pt was easily distracted and often talked immediately following po trials - given education on reducing talking during ALL/ANY oral intake   Solid   GO   Solid: Not tested    Functional Assessment Tool Used: clinical judgement Functional Limitations: Swallowing Swallow Current Status (Z6109): At least 60 percent but less than 80 percent impaired, limited or restricted Swallow Goal Status [phone]): At least 60 percent but less than 80 percent impaired, limited or restricted Swallow Discharge Status [phone]): At least 60 percent but less than 80 percent impaired, limited or restricted    [name] Som, MS, CCC-SLP [name] Stuart 03/28/2017,12:16 PM

## 2017-03-28 NOTE — Progress Notes (Signed)
Pt refused sputum culture

## 2017-03-28 NOTE — H&P (Signed)
[name] Stuart is an 82 y.o. female.   Chief Complaint: Hypoxia HPI: the patient with past medical history of stroke and dysphagia presents to the ED with hypoxia. Her family noticed that her pulse oximetry read 50% at her nursing home. The patient admits to being somewhat short of breath. The is a report of chest pain but the patient denies chest pain at any time. CXR revealed bilateral lower lobe opacities. Sepsis protocol was initiated and the hospitalist service contacted for admission.   Past Medical History:  Diagnosis Date  . Anxiety state, unspecified   . Chronic airway obstruction, not elsewhere classified   . Cognitive communication deficit   . Depressive disorder, not elsewhere classified   . Difficulty in walking(719.7)   . Dysphagia, oropharyngeal phase   . Esophageal reflux   . Insomnia, unspecified   . Muscle weakness (generalized)   . Other and unspecified hyperlipidemia   . Other chronic pain   . Other specified disorder of skin   . Other specified rehabilitation procedure(V57.89)   . Other symptoms involving skin and integumentary tissues   . Pressure ulcer, buttock(707.05)   . Restless legs syndrome (RLS)   . Stroke ([place])   . Unspecified constipation   . Unspecified episodic mood disorder   . Unspecified essential hypertension   . Unspecified late effects of cerebrovascular disease     Past Surgical History:  Procedure Laterality Date  . BONY PELVIS SURGERY    . CHOLECYSTECTOMY      Family History  Problem Relation Age of Onset  . CAD Mother   . Brain cancer Father    Social History:  reports that she has been smoking.  She has been smoking about 0.25 packs per day. She has never used smokeless tobacco. She reports that she does not drink alcohol. Her drug history is not on file.  Allergies:  Allergies  Allergen Reactions  . Aspirin   . Codeine Sulfate   . Ibuprofen   . Penicillins     Medications Prior to Admission  Medication Sig Dispense  Refill  . acetaminophen (TYLENOL) 500 MG tablet Take 1,000 mg by mouth 2 (two) times daily.    Marland Kitchen albuterol (PROVENTIL) (2.5 MG/3ML) 0.083% nebulizer solution Take 2.5 mg by nebulization every 3 (three) hours as needed for wheezing or shortness of breath.    Marland Kitchen albuterol (PROVENTIL) (2.5 MG/3ML) 0.083% nebulizer solution Take 2.5 mg by nebulization 2 (two) times daily.    Marland Kitchen amLODipine (NORVASC) 2.5 MG tablet Take 2.5 mg by mouth daily.    . budesonide (PULMICORT) 0.25 MG/2ML nebulizer solution Take 2 mLs (0.25 mg total) by nebulization 2 (two) times daily. 60 mL 12  . Cholecalciferol 50000 units capsule Take 50,000 Units by mouth every 30 (thirty) days. Patient takes on the 22nd of the month    . clonazePAM (KLONOPIN) 0.25 MG disintegrating tablet Take 0.25 mg by mouth daily.    . clopidogrel (PLAVIX) 75 MG tablet Take 75 mg by mouth daily.     . Cranberry 425 MG CAPS Take 1 capsule by mouth daily.    Marland Kitchen dextromethorphan-guaiFENesin (MUCINEX DM) 30-600 MG 12hr tablet Take 1 tablet by mouth 2 (two) times daily.    . divalproex (DEPAKOTE SPRINKLE) 125 MG capsule Take 250 mg by mouth 3 (three) times daily.    Marland Kitchen escitalopram (LEXAPRO) 10 MG tablet Take 10 mg by mouth daily.    . fluticasone (FLONASE) 50 MCG/ACT nasal spray Place 2 sprays into both nostrils daily.    Marland Kitchen  folic acid (FOLVITE) 694 MCG tablet Take 800 mcg by mouth daily.    Marland Kitchen guaiFENesin-dextromethorphan (ROBITUSSIN DM) 100-10 MG/5ML syrup Take 10 mLs by mouth every 4 (four) hours as needed for cough.    . Melatonin 3 MG TABS Take 3 mg by mouth at bedtime.    Marland Kitchen olopatadine (PATANOL) 0.1 % ophthalmic solution Place 1 drop into both eyes daily.    Marland Kitchen omeprazole (PRILOSEC) 20 MG capsule Take 20 mg by mouth every morning.     Marland Kitchen rOPINIRole (REQUIP) 0.25 MG tablet Take 0.75 mg by mouth every evening.    . senna-docusate (SENOKOT-S) 8.6-50 MG tablet Take 1 tablet by mouth 2 (two) times daily.    . traMADol (ULTRAM) 50 MG tablet Take 1 tablet (50 mg  total) by mouth every 6 (six) hours as needed for moderate pain or severe pain. 30 tablet 0  . traZODone (DESYREL) 150 MG tablet Take 150 mg by mouth at bedtime.      Results for orders placed or performed during the hospital encounter of 03/27/17 (from the past 48 hour(s))  Basic metabolic panel     Status: Abnormal   Collection Time: 03/27/17 11:32 PM  Result Value Ref Range   Sodium 140 135 - 145 mmol/L   Potassium 5.3 (H) 3.5 - 5.1 mmol/L   Chloride 100 (L) 101 - 111 mmol/L   CO2 27 22 - 32 mmol/L   Glucose, Bld 135 (H) 65 - 99 mg/dL   BUN 19 6 - 20 mg/dL   Creatinine, Ser 1.00 0.44 - 1.00 mg/dL   Calcium 9.0 8.9 - 10.3 mg/dL   GFR calc non Af Amer 52 (L) >60 mL/min   GFR calc Af Amer >60 >60 mL/min    Comment: (NOTE) The eGFR has been calculated using the CKD EPI equation. This calculation has not been validated in all clinical situations. eGFR's persistently <60 mL/min signify possible Chronic Kidney Disease.    Anion gap 13 5 - 15  CBC     Status: Abnormal   Collection Time: 03/27/17 11:32 PM  Result Value Ref Range   WBC 17.4 (H) 3.6 - 11.0 K/uL   RBC 4.12 3.80 - 5.20 MIL/uL   Hemoglobin 12.7 12.0 - 16.0 g/dL   HCT 38.1 35.0 - 47.0 %   MCV 92.5 80.0 - 100.0 fL   MCH 30.9 26.0 - 34.0 pg   MCHC 33.4 32.0 - 36.0 g/dL   RDW 13.3 11.5 - 14.5 %   Platelets 190 150 - 440 K/uL  Troponin I     Status: None   Collection Time: 03/27/17 11:32 PM  Result Value Ref Range   Troponin I <0.03 <0.03 ng/mL  TSH     Status: None   Collection Time: 03/27/17 11:32 PM  Result Value Ref Range   TSH 1.607 0.350 - 4.500 uIU/mL    Comment: Performed by a 3rd Generation assay with a functional sensitivity of <=0.01 uIU/mL.  MRSA PCR Screening     Status: None   Collection Time: 03/28/17  4:30 AM  Result Value Ref Range   MRSA by PCR NEGATIVE NEGATIVE    Comment:        The GeneXpert MRSA Assay (FDA approved for NASAL specimens only), is one component of a comprehensive MRSA  colonization surveillance program. It is not intended to diagnose MRSA infection nor to guide or monitor treatment for MRSA infections.    Dg Chest Port 1 View  Result Date: 03/27/2017 CLINICAL DATA:  Rib pain shortness of breath EXAM: PORTABLE CHEST 1 VIEW COMPARISON:  06/03/2015, CT chest 05/27/2015, 05/31/2015 FINDINGS: Linear scarring or atelectasis at the left lower lung. Low lung volumes. Bibasilar opacities, could reflect infiltrates. Left infrahilar opacity is somewhat stellate in configuration. Normal heart size. Aortic atherosclerosis. No pneumothorax. IMPRESSION: Low lung volumes. Bilateral infrahilar opacities could reflect infiltrates. Radiographic follow-up to resolution is recommended. Electronically Signed   By: Donavan Foil M.D.   On: 03/27/2017 22:55    Review of Systems  Constitutional: Negative for chills and fever.  HENT: Negative for sore throat and tinnitus.   Eyes: Negative for blurred vision and redness.  Respiratory: Positive for cough. Negative for shortness of breath.   Cardiovascular: Positive for chest pain. Negative for palpitations, orthopnea and PND.  Gastrointestinal: Negative for abdominal pain, diarrhea, nausea and vomiting.  Genitourinary: Negative for dysuria, frequency and urgency.  Musculoskeletal: Negative for joint pain and myalgias.  Skin: Negative for rash.       No lesions  Neurological: Negative for speech change, focal weakness and weakness.  Endo/Heme/Allergies: Does not bruise/bleed easily.       No temperature intolerance  Psychiatric/Behavioral: Negative for depression and suicidal ideas.    Blood pressure (!) 117/55, pulse 73, temperature 98.2 F (36.8 C), temperature source Oral, resp. rate 20, height 5' 7"  (1.702 m), weight 72.9 kg (160 lb 11.2 oz), SpO2 96 %. Physical Exam  Vitals reviewed. Constitutional: She is oriented to person, place, and time. She appears well-developed and well-nourished. No distress.  HENT:  Head:  Normocephalic and atraumatic.  Mouth/Throat: Oropharynx is clear and moist.  Eyes: Pupils are equal, round, and reactive to light. Conjunctivae and EOM are normal. No scleral icterus.  Neck: Normal range of motion. Neck supple. No JVD present. No tracheal deviation present. No thyromegaly present.  Cardiovascular: Normal rate, regular rhythm and normal heart sounds.  Exam reveals no gallop and no friction rub.   No murmur heard. Respiratory: Effort normal and breath sounds normal.  GI: Soft. Bowel sounds are normal. She exhibits no distension. There is no tenderness.  Genitourinary:  Genitourinary Comments: Deferred  Musculoskeletal: Normal range of motion. She exhibits no edema.  Lymphadenopathy:    She has no cervical adenopathy.  Neurological: She is alert and oriented to person, place, and time. No cranial nerve deficit. She exhibits normal muscle tone.  Skin: Skin is warm and dry. No rash noted. No erythema.  Psychiatric: She has a normal mood and affect. Her behavior is normal. Judgment and thought content normal.     Assessment/Plan This is an 82 year old female admitted for aspiration pneumonia. 1. Pneumonia: Aspiration; meropenem and vancomycin. Supplemental oxygen as needed. 2. Sepsis: the patient meets criteria via leukocytosisand intermittent tachypnea and tachycardia. She is hemodynamically stable. Follow blood cultures for growth and sensitivities. 3. Hypertension: controlled; continue amlodipine 4. Dysphagia: secondary to history of stroke; Honey thick liquids and pure diet. Continue aspirin and Plavix for stroke risk 5. Asthma: continue inhaled corticosteroid. Albuterol prn.   Restless leg syndrome: continue Requip 6. Depression; Continue Lexapro, Depakote and Klonopin per home regimen 7. DVT prophylaxis: lovenox 8. GI prophylaxis Pantoprazole per home regimen The patient is a full code. Time spent on patient care and admission orders approximately 45 minutes  Harrie Foreman, MD 03/28/2017, 7:02 AM

## 2017-03-28 NOTE — ED Notes (Signed)
Patient transported to [id number] 

## 2017-03-28 NOTE — Plan of Care (Signed)
Problem: Physical Regulation: Goal: Will remain free from infection Outcome: Progressing Pt prescribed IV antibiotics   Problem: Fluid Volume: Goal: Ability to maintain a balanced intake and output will improve Outcome: Progressing Pt progressed to pudding thick liquids

## 2017-03-28 NOTE — Progress Notes (Addendum)
Pharmacy Antibiotic Note  [name] Stuart is a 82 y.o. female admitted on 03/27/2017 with pneumonia.  Pharmacy has been consulted for vancomycin dosing.  Plan: Patient received vanc 1g IV x 1 in ED  Will f/u w/ vanc 1.25g IV q24h w/ 6 hour stack dose. Will draw vanc trough 9/28 @ 0700 prior to 4th dose. Will switch from aztreonam to meropenem 1g q12h for anaerobic coverage.  Ke 0.0405 T1/2 17 ~ rounded to 24 hrs and increasing dose to 1.25g for ease of administration Goal trough 15 - 20 mcg/mL.  Height:  (170.2 cm) Weight: 160 lb 11.2 oz (72.9 kg) IBW/kg (Calculated) : 61.6  Temp (24hrs), Avg:98.5 F (36.9 C), Min:98.2 F (36.8 C), Max:99 F (37.2 C)   Recent Labs Lab 03/27/17 2332  WBC 17.4*  CREATININE 1.00    Estimated Creatinine Clearance: 43.6 mL/min (by C-G formula based on SCr of 1 mg/dL).    Allergies  Allergen Reactions  . Aspirin   . Codeine Sulfate   . Ibuprofen   . Penicillins     Thank you for allowing pharmacy to be a part of this patient's care.  Thomasene Ripple, PharmD, BCPS Clinical Pharmacist 03/28/2017

## 2017-03-28 NOTE — ED Notes (Signed)
Patient denies chest pain, and states she just hurts on her left side. Asked where she is hurting on her left side but she is unable to state. Consistently repeats oh oh oh oh oh as if in pain but still cannot state where specifically. Family at bedside states she does that "all the time."

## 2017-03-29 LAB — BLOOD CULTURE ID PANEL (REFLEXED)
Acinetobacter baumannii: NOT DETECTED
CANDIDA ALBICANS: NOT DETECTED
CANDIDA PARAPSILOSIS: NOT DETECTED
CANDIDA TROPICALIS: NOT DETECTED
Candida glabrata: NOT DETECTED
Candida krusei: NOT DETECTED
ENTEROBACTERIACEAE SPECIES: NOT DETECTED
Enterobacter cloacae complex: NOT DETECTED
Enterococcus species: NOT DETECTED
Escherichia coli: NOT DETECTED
HAEMOPHILUS INFLUENZAE: NOT DETECTED
KLEBSIELLA PNEUMONIAE: NOT DETECTED
Klebsiella oxytoca: NOT DETECTED
Listeria monocytogenes: NOT DETECTED
METHICILLIN RESISTANCE: DETECTED — AB
NEISSERIA MENINGITIDIS: NOT DETECTED
PROTEUS SPECIES: NOT DETECTED
Pseudomonas aeruginosa: NOT DETECTED
SERRATIA MARCESCENS: NOT DETECTED
STAPHYLOCOCCUS AUREUS BCID: NOT DETECTED
STAPHYLOCOCCUS SPECIES: DETECTED — AB
STREPTOCOCCUS SPECIES: NOT DETECTED
Streptococcus agalactiae: NOT DETECTED
Streptococcus pneumoniae: NOT DETECTED
Streptococcus pyogenes: NOT DETECTED

## 2017-03-29 LAB — BASIC METABOLIC PANEL
Anion gap: 9 (ref 5–15)
BUN: 15 mg/dL (ref 6–20)
CALCIUM: 8.8 mg/dL — AB (ref 8.9–10.3)
CHLORIDE: 102 mmol/L (ref 101–111)
CO2: 25 mmol/L (ref 22–32)
CREATININE: 0.65 mg/dL (ref 0.44–1.00)
GFR calc Af Amer: >60 mL/min (ref >60)
GFR calc non Af Amer: >60 mL/min (ref >60)
GLUCOSE: 122 mg/dL — AB (ref 65–99)
Potassium: 4.1 mmol/L (ref 3.5–5.1)
Sodium: 136 mmol/L (ref 135–145)

## 2017-03-29 LAB — CBC
HEMATOCRIT: 35.3 % (ref 35.0–47.0)
HEMOGLOBIN: 11.8 g/dL — AB (ref 12.0–16.0)
MCH: 31.5 pg (ref 26.0–34.0)
MCHC: 33.4 g/dL (ref 32.0–36.0)
MCV: 94.3 fL (ref 80.0–100.0)
Platelets: 154 10*3/uL (ref 150–440)
RBC: 3.75 MIL/uL — ABNORMAL LOW (ref 3.80–5.20)
RDW: 13.2 % (ref 11.5–14.5)
WBC: 8.9 10*3/uL (ref 3.6–11.0)

## 2017-03-29 MED ORDER — LEVOFLOXACIN 500 MG PO TABS: 500.0000 mg | ORAL_TABLET | Freq: Every day | ORAL | 0 refills | Status: AC

## 2017-03-29 MED ORDER — VANCOMYCIN HCL 10 G IV SOLR
1250.0000 mg | INTRAVENOUS | Status: DC
  Administered 2017-03-29: 1250 mg via INTRAVENOUS
  Filled 2017-03-29 (×2): qty 1250

## 2017-03-29 NOTE — Progress Notes (Addendum)
Per Infection prevention RN patient does not need to be on contact.

## 2017-03-29 NOTE — Discharge Summary (Signed)
[facility] - [place] at [facility]   PATIENT NAME: [name] Stuart    MR#:  [id number]  DATE OF BIRTH:  08/27/1934  DATE OF ADMISSION:  03/27/2017 ADMITTING PHYSICIAN: Arnaldo Natal, MD  DATE OF DISCHARGE: 03/29/2017  PRIMARY CARE PHYSICIAN: [place], [facility]    ADMISSION DIAGNOSIS:  Shortness of breath [R06.02] Aspiration pneumonia of both lower lobes, unspecified aspiration pneumonia type (HCC) [J69.0]  DISCHARGE DIAGNOSIS:  Principal Problem:   HCAP (healthcare-associated pneumonia) Active Problems:   RLS (restless legs syndrome)   GERD (gastroesophageal reflux disease)   HTN (hypertension)   Anxiety   SECONDARY DIAGNOSIS:   Past Medical History:  Diagnosis Date  . Anxiety state, unspecified   . Chronic airway obstruction, not elsewhere classified   . Cognitive communication deficit   . Depressive disorder, not elsewhere classified   . Difficulty in walking(719.7)   . Dysphagia, oropharyngeal phase   . Esophageal reflux   . Insomnia, unspecified   . Muscle weakness (generalized)   . Other and unspecified hyperlipidemia   . Other chronic pain   . Other specified disorder of skin   . Other specified rehabilitation procedure(V57.89)   . Other symptoms involving skin and integumentary tissues   . Pressure ulcer, buttock(707.05)   . Restless legs syndrome (RLS)   . Stroke (HCC)   . Unspecified constipation   . Unspecified episodic mood disorder   . Unspecified essential hypertension   . Unspecified late effects of cerebrovascular disease     HOSPITAL COURSE:   82 year old female with past medical history of previous CVA, restless leg syndrome, chronic dysphagia, COPD, anxiety/depression, who presents to the hospital due to acute respiratory failure with hypoxia.  1. Acute respiratory failure with hypoxia-suspected be secondary to aspiration pneumonia/Pneumonitis - pt. Was given empiric abx with Meropenem, Vancomycin.  Vanc was  discontinued and maintained on Meropenem and now being discharged on Oral Levaquin. ' - pt. Has been weaned off O2 now and clinically doing much better. D/c Back to SNF today.   2. Pneumonia-suspected to be aspiration pneumonia/Pneumonitis.  - seen by Speech and started on her Pureed diet with Pudding thick liquids.  Empirically pt. Was on Vanc, Meropenem initially but now being discharged on Oral Levaquin.  - she will cont. Diet as mentioned above.  She is afebrile, hemodynamically stable and her WBC count has trended down.   - blood cultures grew out Coag. (-) staph which is likely contaminant.   3. Essential hypertension- she will continue Norvasc.  4. History of previous CVA- she will continue Plavix.  5. Anxiety/depression- she will continue Klonopin, Lexapro.  6. Restless leg syndrome- she will continue Requip.  7. GERD - she will cont. Her Omeprazole.     DISCHARGE CONDITIONS:   Stable.   CONSULTS OBTAINED:    DRUG ALLERGIES:   Allergies  Allergen Reactions  . Aspirin   . Codeine Sulfate   . Ibuprofen   . Penicillins     DISCHARGE MEDICATIONS:   Allergies as of 03/29/2017      Reactions   Aspirin    Codeine Sulfate    Ibuprofen    Penicillins       Medication List    TAKE these medications   acetaminophen 500 MG tablet Commonly known as:  TYLENOL Take 1,000 mg by mouth 2 (two) times daily.   albuterol (2.5 MG/3ML) 0.083% nebulizer solution Commonly known as:  PROVENTIL Take 2.5 mg by nebulization every 3 (three) hours as needed for wheezing  or shortness of breath.   albuterol (2.5 MG/3ML) 0.083% nebulizer solution Commonly known as:  PROVENTIL Take 2.5 mg by nebulization 2 (two) times daily.   amLODipine 2.5 MG tablet Commonly known as:  NORVASC Take 2.5 mg by mouth daily.   budesonide 0.25 MG/2ML nebulizer solution Commonly known as:  PULMICORT Take 2 mLs (0.25 mg total) by nebulization 2 (two) times daily.   Cholecalciferol 50000 units  capsule Take 50,000 Units by mouth every 30 (thirty) days. Patient takes on the 22nd of the month   clonazePAM 0.25 MG disintegrating tablet Commonly known as:  KLONOPIN Take 0.25 mg by mouth daily.   Cranberry 425 MG Caps Take 1 capsule by mouth daily.   divalproex 125 MG capsule Commonly known as:  DEPAKOTE SPRINKLE Take 250 mg by mouth 3 (three) times daily.   escitalopram 10 MG tablet Commonly known as:  LEXAPRO Take 10 mg by mouth daily.   fluticasone 50 MCG/ACT nasal spray Commonly known as:  FLONASE Place 2 sprays into both nostrils daily.   folic acid 800 MCG tablet Commonly known as:  FOLVITE Take 800 mcg by mouth daily.   guaiFENesin-dextromethorphan 100-10 MG/5ML syrup Commonly known as:  ROBITUSSIN DM Take 10 mLs by mouth every 4 (four) hours as needed for cough.   dextromethorphan-guaiFENesin 30-600 MG 12hr tablet Commonly known as:  MUCINEX DM Take 1 tablet by mouth 2 (two) times daily.   levofloxacin 500 MG tablet Commonly known as:  LEVAQUIN Take 1 tablet (500 mg total) by mouth daily.   Melatonin 3 MG Tabs Take 3 mg by mouth at bedtime.   olopatadine 0.1 % ophthalmic solution Commonly known as:  PATANOL Place 1 drop into both eyes daily.   omeprazole 20 MG capsule Commonly known as:  PRILOSEC Take 20 mg by mouth every morning.   PLAVIX 75 MG tablet Generic drug:  clopidogrel Take 75 mg by mouth daily.   rOPINIRole 0.25 MG tablet Commonly known as:  REQUIP Take 0.75 mg by mouth every evening.   senna-docusate 8.6-50 MG tablet Commonly known as:  Senokot-S Take 1 tablet by mouth 2 (two) times daily.   traMADol 50 MG tablet Commonly known as:  ULTRAM Take 1 tablet (50 mg total) by mouth every 6 (six) hours as needed for moderate pain or severe pain.   traZODone 150 MG tablet Commonly known as:  DESYREL Take 150 mg by mouth at bedtime.            Discharge Care Instructions        Start     Ordered   03/29/17 0000   levofloxacin (LEVAQUIN) 500 MG tablet  Daily    Comments:  Stop Date 04/05/17   03/29/17 1058        DISCHARGE INSTRUCTIONS:   DIET:  Pureed diet with Pudding thick liquids. Strict Aspiration Precautions.   DISCHARGE CONDITION:  Stable  ACTIVITY:  Activity as tolerated  OXYGEN:  Home Oxygen: No.   Oxygen Delivery: room air  DISCHARGE LOCATION:  nursing home   If you experience worsening of your admission symptoms, develop shortness of breath, life threatening emergency, suicidal or homicidal thoughts you must seek medical attention immediately by calling 911 or calling your MD immediately  if symptoms less severe.  You Must read complete instructions/literature along with all the possible adverse reactions/side effects for all the Medicines you take and that have been prescribed to you. Take any new Medicines after you have completely understood and accpet all the  possible adverse reactions/side effects.   Please note  You were cared for by a hospitalist during your hospital stay. If you have any questions about your discharge medications or the care you received while you were in the hospital after you are discharged, you can call the unit and asked to speak with the hospitalist on call if the hospitalist that took care of you is not available. Once you are discharged, your primary care physician will handle any further medical issues. Please note that NO REFILLS for any discharge medications will be authorized once you are discharged, as it is imperative that you return to your primary care physician (or establish a relationship with a primary care physician if you do not have one) for your aftercare needs so that they can reassess your need for medications and monitor your lab values.     Today   Pt. Denies any shortness of breath, cough.  Tolerating PO well.  Family at bedside.  No acute events overnight. Will d/c to SNF later today.   VITAL SIGNS:  Blood pressure  139/62, pulse 63, temperature 98.3 F (36.8 C), temperature source Oral, resp. rate 18, height 5\' 7"  (1.702 m), weight 72.9 kg (160 lb 11.2 oz), SpO2 98 %.  I/O:   Intake/Output Summary (Last 24 hours) at 03/29/17 1102 Last data filed at 03/29/17 0137  Gross per 24 hour  Intake              118 ml  Output                0 ml  Net              118 ml    PHYSICAL EXAMINATION:   GENERAL:  82 y.o.-year-old patient lying in bed in no acute distress.  EYES: Pupils equal, round, reactive to light and accommodation. No scleral icterus. Extraocular muscles intact.  HEENT: Head atraumatic, normocephalic. Oropharynx and nasopharynx clear.  NECK:  Supple, no jugular venous distention. No thyroid enlargement, no tenderness.  LUNGS: Normal breath sounds bilaterally, no wheezing, rales, rhonchi. No use of accessory muscles of respiration.  CARDIOVASCULAR: S1, S2 normal. No murmurs, rubs, or gallops.  ABDOMEN: Soft, nontender, nondistended. Bowel sounds present. No organomegaly or mass.  EXTREMITIES: No cyanosis, clubbing, +1-2 edema b/l (dependent edema due to poor mobility) NEUROLOGIC: Cranial nerves II through XII are intact. No focal Motor or sensory deficits b/l. Globally weak and bedbound due to previous CVA. PSYCHIATRIC: The patient is alert and oriented x 3.  SKIN: No obvious rash, lesion, or ulcer.   DATA REVIEW:   CBC  Recent Labs Lab 03/27/17 2332  WBC 17.4*  HGB 12.7  HCT 38.1  PLT 190    Chemistries   Recent Labs Lab 03/27/17 2332  NA 140  K 5.3*  CL 100*  CO2 27  GLUCOSE 135*  BUN 19  CREATININE 1.00  CALCIUM 9.0    Cardiac Enzymes  Recent Labs Lab 03/27/17 2332  TROPONINI <0.03    Microbiology Results  Results for orders placed or performed during the hospital encounter of 03/27/17  Blood culture (routine x 2)     Status: None (Preliminary result)   Collection Time: 03/27/17 11:32 PM  Result Value Ref Range Status   Specimen Description BLOOD RIGHT  ARM  Final   Special Requests   Final    BOTTLES DRAWN AEROBIC AND ANAEROBIC Blood Culture adequate volume   Culture  Setup Time   Final  Organism ID to follow GRAM POSITIVE COCCI IN BOTH AEROBIC AND ANAEROBIC BOTTLES CRITICAL RESULT CALLED TO, READ BACK BY AND VERIFIED WITH: DAVID BESANDTI @ 0154 ON 03/29/2017 BY [facility] CRITICAL RESULT CALLED TO, READ BACK BY AND VERIFIED WITH: DAVID BESANTI AT 0343 03/29/17 ALV    Culture GRAM POSITIVE COCCI  Final   Report Status PENDING  Incomplete  Blood Culture ID Panel (Reflexed)     Status: Abnormal   Collection Time: 03/27/17 11:32 PM  Result Value Ref Range Status   Enterococcus species NOT DETECTED NOT DETECTED Final   Listeria monocytogenes NOT DETECTED NOT DETECTED Final   Staphylococcus species DETECTED (A) NOT DETECTED Final    Comment: Methicillin (oxacillin) resistant coagulase negative staphylococcus. Possible blood culture contaminant (unless isolated from more than one blood culture draw or clinical case suggests pathogenicity). No antibiotic treatment is indicated for blood  culture contaminants. CRITICAL RESULT CALLED TO, READ BACK BY AND VERIFIED WITH: DAVID BESANDTI @ 0154 ON 03/29/2017 BY CAF    Staphylococcus aureus NOT DETECTED NOT DETECTED Final   Methicillin resistance DETECTED (A) NOT DETECTED Final    Comment: CRITICAL RESULT CALLED TO, READ BACK BY AND VERIFIED WITH: DAVID BESANDTI @ 0154 ON 03/29/2017 BY CAF    Streptococcus species NOT DETECTED NOT DETECTED Final   Streptococcus agalactiae NOT DETECTED NOT DETECTED Final   Streptococcus pneumoniae NOT DETECTED NOT DETECTED Final   Streptococcus pyogenes NOT DETECTED NOT DETECTED Final   Acinetobacter baumannii NOT DETECTED NOT DETECTED Final   Enterobacteriaceae species NOT DETECTED NOT DETECTED Final   Enterobacter cloacae complex NOT DETECTED NOT DETECTED Final   Escherichia coli NOT DETECTED NOT DETECTED Final   Klebsiella oxytoca NOT DETECTED NOT DETECTED Final    Klebsiella pneumoniae NOT DETECTED NOT DETECTED Final   Proteus species NOT DETECTED NOT DETECTED Final   Serratia marcescens NOT DETECTED NOT DETECTED Final   Haemophilus influenzae NOT DETECTED NOT DETECTED Final   Neisseria meningitidis NOT DETECTED NOT DETECTED Final   Pseudomonas aeruginosa NOT DETECTED NOT DETECTED Final   Candida albicans NOT DETECTED NOT DETECTED Final   Candida glabrata NOT DETECTED NOT DETECTED Final   Candida krusei NOT DETECTED NOT DETECTED Final   Candida parapsilosis NOT DETECTED NOT DETECTED Final   Candida tropicalis NOT DETECTED NOT DETECTED Final  Blood culture (routine x 2)     Status: None (Preliminary result)   Collection Time: 03/27/17 11:56 PM  Result Value Ref Range Status   Specimen Description BLOOD LEFT ARM  Final   Special Requests   Final    BOTTLES DRAWN AEROBIC AND ANAEROBIC Blood Culture adequate volume   Culture NO GROWTH 1 DAY  Final   Report Status PENDING  Incomplete  MRSA PCR Screening     Status: None   Collection Time: 03/28/17  4:30 AM  Result Value Ref Range Status   MRSA by PCR NEGATIVE NEGATIVE Final    Comment:        The GeneXpert MRSA Assay (FDA approved for NASAL specimens only), is one component of a comprehensive MRSA colonization surveillance program. It is not intended to diagnose MRSA infection nor to guide or monitor treatment for MRSA infections.     RADIOLOGY:  Dg Chest Port 1 View  Result Date: 03/27/2017 CLINICAL DATA:  Rib pain shortness of breath EXAM: PORTABLE CHEST 1 VIEW COMPARISON:  06/03/2015, CT chest 05/27/2015, 05/31/2015 FINDINGS: Linear scarring or atelectasis at the left lower lung. Low lung volumes. Bibasilar opacities, could reflect infiltrates. Left  infrahilar opacity is somewhat stellate in configuration. Normal heart size. Aortic atherosclerosis. No pneumothorax. IMPRESSION: Low lung volumes. Bilateral infrahilar opacities could reflect infiltrates. Radiographic follow-up to resolution  is recommended. Electronically Signed   By: Jasmine Pang M.D.   On: 03/27/2017 22:55      Management plans discussed with the patient, family and they are in agreement.  CODE STATUS:     Code Status Orders        Start     Ordered   03/28/17 0425  Full code  Continuous     03/28/17 0424    Code Status History    Date Active Date Inactive Code Status Order ID Comments User Context   05/27/2015 12:26 PM 06/05/2015  6:14 PM Full Code [id number]  Altamese Dilling, MD Inpatient    TOTAL TIME TAKING CARE OF THIS PATIENT: 40 minutes.    Houston Siren M.D on 03/29/2017 at 11:02 AM  Between 7am to 6pm - Pager - [phone]  After 6pm go to [email or web address] - Therapist, nutritional Hospitalists  Office  [phone]  CC: Primary care physician; [place], [facility]

## 2017-03-29 NOTE — Progress Notes (Signed)
Per Dr. Cherlynn Kaiser okay to stop IV vancy as IV is swollen at this time and pt is likely to discharge.

## 2017-03-29 NOTE — Clinical Social Work Note (Signed)
Patient to discharge today to return to [facility]. Discharge information sent and nurse to call report then family to notify of discharge. [facility] at [facility] is aware of discharge.Patient to transport via EMS. York Spaniel MSW,LCSW [phone]

## 2017-03-29 NOTE — Progress Notes (Signed)
[name] Stuart  A and O x 1. VSS. Pt tolerating diet well. No complaints of pain or nausea. IV removed intact, prescriptions given.  Pt discharged via EMS to [facility]. Report called to RN at facility.  Orvil Feil MSN, RN-BC  Allergies as of 03/29/2017      Reactions   Aspirin    Codeine Sulfate    Ibuprofen    Penicillins       Medication List    TAKE these medications   acetaminophen 500 MG tablet Commonly known as:  TYLENOL Take 1,000 mg by mouth 2 (two) times daily.   albuterol (2.5 MG/3ML) 0.083% nebulizer solution Commonly known as:  PROVENTIL Take 2.5 mg by nebulization every 3 (three) hours as needed for wheezing or shortness of breath.   albuterol (2.5 MG/3ML) 0.083% nebulizer solution Commonly known as:  PROVENTIL Take 2.5 mg by nebulization 2 (two) times daily.   amLODipine 2.5 MG tablet Commonly known as:  NORVASC Take 2.5 mg by mouth daily.   budesonide 0.25 MG/2ML nebulizer solution Commonly known as:  PULMICORT Take 2 mLs (0.25 mg total) by nebulization 2 (two) times daily.   Cholecalciferol 50000 units capsule Take 50,000 Units by mouth every 30 (thirty) days. Patient takes on the 22nd of the month   clonazePAM 0.25 MG disintegrating tablet Commonly known as:  KLONOPIN Take 0.25 mg by mouth daily.   Cranberry 425 MG Caps Take 1 capsule by mouth daily.   divalproex 125 MG capsule Commonly known as:  DEPAKOTE SPRINKLE Take 250 mg by mouth 3 (three) times daily.   escitalopram 10 MG tablet Commonly known as:  LEXAPRO Take 10 mg by mouth daily.   fluticasone 50 MCG/ACT nasal spray Commonly known as:  FLONASE Place 2 sprays into both nostrils daily.   folic acid 800 MCG tablet Commonly known as:  FOLVITE Take 800 mcg by mouth daily.   guaiFENesin-dextromethorphan 100-10 MG/5ML syrup Commonly known as:  ROBITUSSIN DM Take 10 mLs by mouth every 4 (four) hours as needed for cough.   dextromethorphan-guaiFENesin 30-600 MG 12hr tablet Commonly  known as:  MUCINEX DM Take 1 tablet by mouth 2 (two) times daily.   levofloxacin 500 MG tablet Commonly known as:  LEVAQUIN Take 1 tablet (500 mg total) by mouth daily.   Melatonin 3 MG Tabs Take 3 mg by mouth at bedtime.   olopatadine 0.1 % ophthalmic solution Commonly known as:  PATANOL Place 1 drop into both eyes daily.   omeprazole 20 MG capsule Commonly known as:  PRILOSEC Take 20 mg by mouth every morning.   PLAVIX 75 MG tablet Generic drug:  clopidogrel Take 75 mg by mouth daily.   rOPINIRole 0.25 MG tablet Commonly known as:  REQUIP Take 0.75 mg by mouth every evening.   senna-docusate 8.6-50 MG tablet Commonly known as:  Senokot-S Take 1 tablet by mouth 2 (two) times daily.   traMADol 50 MG tablet Commonly known as:  ULTRAM Take 1 tablet (50 mg total) by mouth every 6 (six) hours as needed for moderate pain or severe pain.   traZODone 150 MG tablet Commonly known as:  DESYREL Take 150 mg by mouth at bedtime.            Discharge Care Instructions        Start     Ordered   03/29/17 0000  levofloxacin (LEVAQUIN) 500 MG tablet  Daily    Comments:  Stop Date 04/05/17   03/29/17 1058   03/29/17  0000  Activity as tolerated - No restrictions     03/29/17 1313   03/29/17 0000  Diet general    Comments:  Pureed Diet with Pudding thick liquids. Aspiration Precautions.   03/29/17 1313      Vitals:   03/29/17 1411 03/29/17 1641  BP: (!) 142/58 132/89  Pulse: 75 71  Resp: 16 16  Temp: 98.7 F (37.1 C) 98.1 F (36.7 C)  SpO2: 94% 99%

## 2017-03-29 NOTE — Progress Notes (Signed)
PHARMACY - PHYSICIAN COMMUNICATION CRITICAL VALUE ALERT - BLOOD CULTURE IDENTIFICATION (BCID)  Results for orders placed or performed during the hospital encounter of 03/27/17  Blood Culture ID Panel (Reflexed) (Collected: 03/27/2017 11:32 PM)  Result Value Ref Range   Enterococcus species NOT DETECTED NOT DETECTED   Listeria monocytogenes NOT DETECTED NOT DETECTED   Staphylococcus species DETECTED (A) NOT DETECTED   Staphylococcus aureus NOT DETECTED NOT DETECTED   Methicillin resistance DETECTED (A) NOT DETECTED   Streptococcus species NOT DETECTED NOT DETECTED   Streptococcus agalactiae NOT DETECTED NOT DETECTED   Streptococcus pneumoniae NOT DETECTED NOT DETECTED   Streptococcus pyogenes NOT DETECTED NOT DETECTED   Acinetobacter baumannii NOT DETECTED NOT DETECTED   Enterobacteriaceae species NOT DETECTED NOT DETECTED   Enterobacter cloacae complex NOT DETECTED NOT DETECTED   Escherichia coli NOT DETECTED NOT DETECTED   Klebsiella oxytoca NOT DETECTED NOT DETECTED   Klebsiella pneumoniae NOT DETECTED NOT DETECTED   Proteus species NOT DETECTED NOT DETECTED   Serratia marcescens NOT DETECTED NOT DETECTED   Haemophilus influenzae NOT DETECTED NOT DETECTED   Neisseria meningitidis NOT DETECTED NOT DETECTED   Pseudomonas aeruginosa NOT DETECTED NOT DETECTED   Candida albicans NOT DETECTED NOT DETECTED   Candida glabrata NOT DETECTED NOT DETECTED   Candida krusei NOT DETECTED NOT DETECTED   Candida parapsilosis NOT DETECTED NOT DETECTED   Candida tropicalis NOT DETECTED NOT DETECTED    Name of physician (or Provider) Contacted: Oralia Manis  Changes to prescribed antibiotics required: patient was previously on vanc and merrem, vanc was then d/c'd because of a negative MRSA PCR. Since BCID is showing staph MecA + will restart vanc 1.25g IV q24h w/ vanc trough 9/28 @ 0700 prior to 4th dose.  Thomasene Ripple, PharmD, BCPS Clinical Pharmacist 03/29/2017

## 2017-04-02 LAB — CULTURE, BLOOD (ROUTINE X 2)
CULTURE: NO GROWTH
Special Requests: ADEQUATE

## 2017-04-08 LAB — CULTURE, BLOOD (ROUTINE X 2): SPECIAL REQUESTS: ADEQUATE

## 2017-11-01 DEATH — deceased
